# Patient Record
Sex: Female | Born: 1960 | Race: White | Hispanic: No | Marital: Single | Smoking: Current every day smoker
Health system: Southern US, Community
[De-identification: ages and names within clinical notes are randomized; demographics above are authoritative.]

## PROBLEM LIST (undated history)

## (undated) DIAGNOSIS — F79 Unspecified intellectual disabilities: Secondary | ICD-10-CM

## (undated) DIAGNOSIS — R569 Unspecified convulsions: Secondary | ICD-10-CM

## (undated) DIAGNOSIS — N289 Disorder of kidney and ureter, unspecified: Secondary | ICD-10-CM

## (undated) HISTORY — PX: ANKLE FUSION: SHX881

---

## 2014-03-23 ENCOUNTER — Emergency Department: Payer: Self-pay | Admitting: Emergency Medicine

## 2014-03-23 LAB — DRUG SCREEN, URINE
Amphetamines, Ur Screen: NEGATIVE (ref ?–1000)
Barbiturates, Ur Screen: NEGATIVE (ref ?–200)
Benzodiazepine, Ur Scrn: NEGATIVE (ref ?–200)
CANNABINOID 50 NG, UR ~~LOC~~: NEGATIVE (ref ?–50)
Cocaine Metabolite,Ur ~~LOC~~: NEGATIVE (ref ?–300)
MDMA (Ecstasy)Ur Screen: POSITIVE (ref ?–500)
Methadone, Ur Screen: NEGATIVE (ref ?–300)
Opiate, Ur Screen: NEGATIVE (ref ?–300)
PHENCYCLIDINE (PCP) UR S: NEGATIVE (ref ?–25)
Tricyclic, Ur Screen: NEGATIVE (ref ?–1000)

## 2014-03-23 LAB — CBC
HCT: 38.1 % (ref 35.0–47.0)
HGB: 12.5 g/dL (ref 12.0–16.0)
MCH: 30 pg (ref 26.0–34.0)
MCHC: 32.7 g/dL (ref 32.0–36.0)
MCV: 92 fL (ref 80–100)
Platelet: 267 10*3/uL (ref 150–440)
RBC: 4.15 10*6/uL (ref 3.80–5.20)
RDW: 13.9 % (ref 11.5–14.5)
WBC: 14.2 10*3/uL — ABNORMAL HIGH (ref 3.6–11.0)

## 2014-03-23 LAB — URINALYSIS, COMPLETE
BLOOD: NEGATIVE
Bilirubin,UR: NEGATIVE
Glucose,UR: NEGATIVE mg/dL (ref 0–75)
Ketone: NEGATIVE
Nitrite: NEGATIVE
PH: 5 (ref 4.5–8.0)
RBC,UR: 4 /HPF (ref 0–5)
Specific Gravity: 1.015 (ref 1.003–1.030)
WBC UR: 55 /HPF (ref 0–5)

## 2014-03-23 LAB — COMPREHENSIVE METABOLIC PANEL
ALBUMIN: 3.9 g/dL (ref 3.4–5.0)
ALK PHOS: 68 U/L
ALT: 20 U/L
ANION GAP: 6 — AB (ref 7–16)
AST: 18 U/L (ref 15–37)
BILIRUBIN TOTAL: 0.4 mg/dL (ref 0.2–1.0)
BUN: 31 mg/dL — ABNORMAL HIGH (ref 7–18)
CO2: 26 mmol/L (ref 21–32)
Calcium, Total: 9.7 mg/dL (ref 8.5–10.1)
Chloride: 108 mmol/L — ABNORMAL HIGH (ref 98–107)
Creatinine: 2.97 mg/dL — ABNORMAL HIGH (ref 0.60–1.30)
EGFR (African American): 20 — ABNORMAL LOW
EGFR (Non-African Amer.): 17 — ABNORMAL LOW
Glucose: 138 mg/dL — ABNORMAL HIGH (ref 65–99)
Osmolality: 288 (ref 275–301)
Potassium: 4.2 mmol/L (ref 3.5–5.1)
SODIUM: 140 mmol/L (ref 136–145)
Total Protein: 8.3 g/dL — ABNORMAL HIGH (ref 6.4–8.2)

## 2014-03-23 LAB — SALICYLATE LEVEL: Salicylates, Serum: 4.5 mg/dL — ABNORMAL HIGH

## 2014-03-23 LAB — ACETAMINOPHEN LEVEL

## 2014-03-23 LAB — ETHANOL: Ethanol %: <0.003 % (ref 0.000–0.080)

## 2014-03-24 LAB — CBC WITH DIFFERENTIAL/PLATELET
Basophil #: 0.1 10*3/uL (ref 0.0–0.1)
Basophil %: 0.8 %
Eosinophil #: 0.5 10*3/uL (ref 0.0–0.7)
Eosinophil %: 6.9 %
HCT: 32.4 % — AB (ref 35.0–47.0)
HGB: 10.6 g/dL — ABNORMAL LOW (ref 12.0–16.0)
Lymphocyte #: 2.1 10*3/uL (ref 1.0–3.6)
Lymphocyte %: 30.3 %
MCH: 30.5 pg (ref 26.0–34.0)
MCHC: 32.8 g/dL (ref 32.0–36.0)
MCV: 93 fL (ref 80–100)
MONOS PCT: 7.8 %
Monocyte #: 0.5 x10 3/mm (ref 0.2–0.9)
NEUTROS ABS: 3.7 10*3/uL (ref 1.4–6.5)
Neutrophil %: 54.2 %
Platelet: 203 10*3/uL (ref 150–440)
RBC: 3.49 10*6/uL — ABNORMAL LOW (ref 3.80–5.20)
RDW: 14.1 % (ref 11.5–14.5)
WBC: 6.9 10*3/uL (ref 3.6–11.0)

## 2014-03-24 LAB — COMPREHENSIVE METABOLIC PANEL
ALBUMIN: 3.1 g/dL — AB (ref 3.4–5.0)
ALT: 14 U/L
Alkaline Phosphatase: 64 U/L
Anion Gap: 4 — ABNORMAL LOW (ref 7–16)
BILIRUBIN TOTAL: 0.3 mg/dL (ref 0.2–1.0)
BUN: 33 mg/dL — AB (ref 7–18)
Calcium, Total: 8.5 mg/dL (ref 8.5–10.1)
Chloride: 109 mmol/L — ABNORMAL HIGH (ref 98–107)
Co2: 26 mmol/L (ref 21–32)
Creatinine: 2.59 mg/dL — ABNORMAL HIGH (ref 0.60–1.30)
EGFR (African American): 24 — ABNORMAL LOW
EGFR (Non-African Amer.): 20 — ABNORMAL LOW
GLUCOSE: 106 mg/dL — AB (ref 65–99)
Osmolality: 285 (ref 275–301)
Potassium: 4 mmol/L (ref 3.5–5.1)
SGOT(AST): 12 U/L — ABNORMAL LOW (ref 15–37)
Sodium: 139 mmol/L (ref 136–145)
Total Protein: 6.8 g/dL (ref 6.4–8.2)

## 2014-07-08 ENCOUNTER — Ambulatory Visit: Payer: Self-pay | Admitting: Nephrology
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
## 2014-12-20 NOTE — Consult Note (Signed)
Brief Consult Note: Comments: Psychiatry: Follow up on patient seen by Dr Demetrius CharityP. Patient has no new complaints and has been calm and not threatening or agitated. Patient is to be discharged thhis evening. Will proceed to halt IVC paperwork.  Electronic Signatures: Audery Amellapacs, Wayde Gopaul T (MD)  (Signed 29-Jul-15 17:32)  Authored: Brief Consult Note   Last Updated: 29-Jul-15 17:32 by Audery Amellapacs, Jalyn Dutta T (MD)

## 2015-02-11 ENCOUNTER — Encounter: Payer: Self-pay | Admitting: Emergency Medicine

## 2015-02-11 ENCOUNTER — Emergency Department
Admission: EM | Admit: 2015-02-11 | Discharge: 2015-02-13 | Disposition: A | Discharge status: Other Acute Inpatient Hospital | Payer: Medicare Other | Attending: Emergency Medicine | Admitting: Emergency Medicine

## 2015-02-11 DIAGNOSIS — R51 Headache: Secondary | ICD-10-CM | POA: Diagnosis not present

## 2015-02-11 DIAGNOSIS — R451 Restlessness and agitation: Secondary | ICD-10-CM

## 2015-02-11 DIAGNOSIS — Z720 Tobacco use: Secondary | ICD-10-CM | POA: Insufficient documentation

## 2015-02-11 DIAGNOSIS — F319 Bipolar disorder, unspecified: Secondary | ICD-10-CM

## 2015-02-11 DIAGNOSIS — F911 Conduct disorder, childhood-onset type: Secondary | ICD-10-CM | POA: Diagnosis present

## 2015-02-11 HISTORY — DX: Unspecified intellectual disabilities: F79

## 2015-02-11 LAB — COMPREHENSIVE METABOLIC PANEL
ALBUMIN: 4 g/dL (ref 3.5–5.0)
ALK PHOS: 73 U/L (ref 38–126)
ALT: 10 U/L — AB (ref 14–54)
ANION GAP: 6 (ref 5–15)
AST: 15 U/L (ref 15–41)
BUN: 30 mg/dL — ABNORMAL HIGH (ref 6–20)
CO2: 28 mmol/L (ref 22–32)
Calcium: 9.9 mg/dL (ref 8.9–10.3)
Chloride: 110 mmol/L (ref 101–111)
Creatinine, Ser: 2.1 mg/dL — ABNORMAL HIGH (ref 0.44–1.00)
GFR calc Af Amer: 30 mL/min — ABNORMAL LOW (ref >60)
GFR calc non Af Amer: 26 mL/min — ABNORMAL LOW (ref >60)
Glucose, Bld: 118 mg/dL — ABNORMAL HIGH (ref 65–99)
Potassium: 4.6 mmol/L (ref 3.5–5.1)
SODIUM: 144 mmol/L (ref 135–145)
TOTAL PROTEIN: 8.1 g/dL (ref 6.5–8.1)
Total Bilirubin: 0.4 mg/dL (ref 0.3–1.2)

## 2015-02-11 LAB — CBC
HEMATOCRIT: 36.2 % (ref 35.0–47.0)
Hemoglobin: 12.4 g/dL (ref 12.0–16.0)
MCH: 31.1 pg (ref 26.0–34.0)
MCHC: 34.3 g/dL (ref 32.0–36.0)
MCV: 90.6 fL (ref 80.0–100.0)
Platelets: 260 10*3/uL (ref 150–440)
RBC: 3.99 MIL/uL (ref 3.80–5.20)
RDW: 14.5 % (ref 11.5–14.5)
WBC: 7.4 10*3/uL (ref 3.6–11.0)

## 2015-02-11 LAB — URINALYSIS COMPLETE WITH MICROSCOPIC (ARMC ONLY)
Bacteria, UA: NONE SEEN
Bilirubin Urine: NEGATIVE
Glucose, UA: NEGATIVE mg/dL
Hgb urine dipstick: NEGATIVE
Ketones, ur: NEGATIVE mg/dL
Nitrite: NEGATIVE
PH: 6 (ref 5.0–8.0)
PROTEIN: NEGATIVE mg/dL
Specific Gravity, Urine: 1.014 (ref 1.005–1.030)

## 2015-02-11 LAB — URINE DRUG SCREEN, QUALITATIVE (ARMC ONLY)
Amphetamines, Ur Screen: NOT DETECTED
Barbiturates, Ur Screen: NOT DETECTED
Benzodiazepine, Ur Scrn: NOT DETECTED
Cannabinoid 50 Ng, Ur ~~LOC~~: NOT DETECTED
Cocaine Metabolite,Ur ~~LOC~~: NOT DETECTED
MDMA (ECSTASY) UR SCREEN: NOT DETECTED
Methadone Scn, Ur: NOT DETECTED
OPIATE, UR SCREEN: NOT DETECTED
PHENCYCLIDINE (PCP) UR S: NOT DETECTED
Tricyclic, Ur Screen: POSITIVE — AB

## 2015-02-11 LAB — ETHANOL

## 2015-02-11 LAB — SALICYLATE LEVEL

## 2015-02-11 LAB — ACETAMINOPHEN LEVEL: Acetaminophen (Tylenol), Serum: <10 ug/mL — ABNORMAL LOW (ref 10–30)

## 2015-02-11 NOTE — ED Notes (Signed)
Pt changed into paper scrubs in triage.

## 2015-02-11 NOTE — ED Notes (Signed)
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
## 2015-02-11 NOTE — ED Notes (Signed)
Per Dr. Lenard Lance, pt does not need one-on-one sitter.  Fifteen minutes checks required.
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
## 2015-02-11 NOTE — ED Notes (Signed)
Patient assigned to appropriate care area. Patient oriented to unit/care area: Informed that, for their safety, care areas are designed for safety and monitored by security cameras at all times; and visiting hours explained to patient. Patient verbalizes understanding, and verbal contract for safety obtained. 
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
## 2015-02-12 ENCOUNTER — Emergency Department: Payer: Medicare Other

## 2015-02-12 DIAGNOSIS — F4325 Adjustment disorder with mixed disturbance of emotions and conduct: Secondary | ICD-10-CM

## 2015-02-12 DIAGNOSIS — R451 Restlessness and agitation: Secondary | ICD-10-CM | POA: Diagnosis not present

## 2015-02-12 DIAGNOSIS — F319 Bipolar disorder, unspecified: Secondary | ICD-10-CM

## 2015-02-12 LAB — PREGNANCY, URINE: Preg Test, Ur: NEGATIVE

## 2015-02-12 MED ORDER — ACETAMINOPHEN 500 MG PO TABS
1000.0000 mg | ORAL_TABLET | Freq: Once | ORAL | Status: AC
  Administered 2015-02-12: 1000 mg via ORAL

## 2015-02-12 MED ORDER — NICOTINE 10 MG IN INHA
1.0000 | RESPIRATORY_TRACT | Status: DC | PRN
  Administered 2015-02-12: 18:00:00 via RESPIRATORY_TRACT

## 2015-02-12 MED ORDER — ACETAMINOPHEN 500 MG PO TABS
ORAL_TABLET | ORAL | Status: AC
Start: 2015-02-12 — End: 2015-02-12
  Administered 2015-02-12: 1000 mg via ORAL
  Filled 2015-02-12: qty 2

## 2015-02-12 MED ORDER — ONDANSETRON 4 MG PO TBDP
8.0000 mg | ORAL_TABLET | Freq: Once | ORAL | Status: AC
  Administered 2015-02-13: 8 mg via ORAL

## 2015-02-12 MED ORDER — NICOTINE 10 MG IN INHA
RESPIRATORY_TRACT | Status: AC
  Filled 2015-02-12: qty 36

## 2015-02-12 MED ORDER — ACETAMINOPHEN 325 MG PO TABS
650.0000 mg | ORAL_TABLET | Freq: Once | ORAL | Status: AC
  Administered 2015-02-13: 650 mg via ORAL

## 2015-02-12 NOTE — ED Notes (Signed)
Patient assigned to appropriate care area. Patient oriented to unit/care area: Informed that, for their safety, care areas are designed for safety and monitored by security cameras at all times; and visiting hours explained to patient. Patient verbalizes understanding, and verbal contract for safety obtained. 
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
## 2015-02-12 NOTE — ED Notes (Signed)

## 2015-02-12 NOTE — ED Notes (Signed)

## 2015-02-12 NOTE — ED Notes (Signed)
Pt. Noted in day room. No complaints or concerns voiced. No distress or abnormal behavior noted. Will continue to monitor with security cameras. Q 15 minute rounds continue. 

## 2015-02-12 NOTE — ED Notes (Signed)
Patient returned to [facility] from CT scan. No obvious distress. Will continue to monitor.

## 2015-02-12 NOTE — ED Notes (Signed)
ED BHU PLACEMENT JUSTIFICATION Is the patient under IVC or is there intent for IVC: Yes.   Is the patient medically cleared: Yes.   Is there vacancy in the ED BHU: Yes.   Is the population mix appropriate for patient: Yes.   Is the patient awaiting placement in inpatient or outpatient setting: Patient awaiting psych consult in order to determine disposition Has the patient had a psychiatric consult: No. Survey of unit performed for contraband, proper placement and condition of furniture, tampering with fixtures in bathroom, shower, and each patient room: Yes.  ; Findings: none APPEARANCE/BEHAVIOR calm and cooperative NEURO ASSESSMENT Orientation: alert and oriented x4 Hallucinations: none verbalized or observed Speech: Normal Gait: normal RESPIRATORY ASSESSMENT Breathing Pattern: regular and unlabored CARDIOVASCULAR ASSESSMENT Skin warm and dry; Color WNL GASTROINTESTINAL ASSESSMENT No abdominal complaints at this time EXTREMITIES Moves all extremities PLAN OF CARE Provide calm/safe environment. Vital signs assessed twice daily. ED BHU Assessment once each 12-hour shift. Collaborate with intake RN daily or as condition indicates. Assure the ED provider has rounded once each shift. Provide and encourage hygiene. Provide redirection as needed. Assess for escalating behavior; address immediately and inform ED provider.  Assess family dynamic and appropriateness for visitation as needed: Yes.  ; If necessary, describe findings: n/a Educate the patient/family about BHU procedures/visitation: Yes.  ; If necessary, describe findings: n/a
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
## 2015-02-12 NOTE — ED Notes (Signed)
No POC urine pregnancy results noted in chart. Discussed with ED MD. Verbal order to modify order to urine pregnancy test. Lab notified of add on test.

## 2015-02-12 NOTE — ED Notes (Signed)
Pt. Noted in room. No complaints or concerns voiced. No distress or abnormal behavior noted. Will continue to monitor with security cameras. Q 15 minute rounds continue. Sandwich and soft drink given. 

## 2015-02-12 NOTE — ED Notes (Signed)
Report received from Jean RN. Pt. Alert and oriented in no distress denies SI, HI, AVH and pain.  Pt. Instructed to come to me with problems or concerns.Will continue to monitor for safety via security cameras and Q 15 minute checks. 

## 2015-02-12 NOTE — ED Provider Notes (Signed)
-----------------------------------------   6:26 AM on 02/12/2015 -----------------------------------------   BP 120/89 mmHg  Pulse 100  Temp(Src) 98.3 F (36.8 C) (Oral)  Resp 18  Ht 5\' 3"  (1.6 m)  Wt 290 lb (131.543 kg)  BMI 51.38 kg/m2  SpO2 96%  The patient had no acute events since last update.  Calm and cooperative at this time.  Disposition is pending per Psychiatry/Behavioral Medicine team recommendations.     Sharman Cheek, MD 02/12/15 [phone]
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
## 2015-02-12 NOTE — ED Notes (Signed)
Pt ambulated to the bathroom and returned to her bedroom without difficulty.

## 2015-02-12 NOTE — ED Notes (Addendum)
Patient tearful and c/o headache. Notified ED MD. Discussed patient's home meds with patient; patient cannot remember her meds or pharmacy at this time.

## 2015-02-12 NOTE — ED Notes (Addendum)
BEHAVIORAL HEALTH ROUNDING Patient sleeping: no   Patient alert and oriented: yes Behavior appropriate: Yes.  ; If no, describe: Nutrition and fluids offered: no Toileting and hygiene offered: yes Sitter present: no Law enforcement present: Yes  and [place]DS  

## 2015-02-12 NOTE — ED Notes (Signed)
Pt. Noted in room. No complaints or concerns voiced. No distress or abnormal behavior noted. Will continue to monitor with security cameras. Q 15 minute rounds continue. 
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
## 2015-02-12 NOTE — ED Notes (Signed)
Patient transported to CT. ODS and RN escort.

## 2015-02-12 NOTE — [facility] Counselor (Signed)
Pt. is to be admitted to [facility] by Dr. Toni Amend. Attending Physician will be Dr. Ardyth Harps.  Pt. has been assigned to room 309, by [facility] Charge Nurse Jerry Caras.  Intake Paper Work has been signed and placed on pt. chart. ER staff Bonita Quin and [place], ER Sect, Glenford Peers., RN, Bradly Chris Pt. Access) have been made aware of the admission.   Writer attempted to contact pt. Family to informed them but was unable to contact with family (Brother and sister-in-law: Shaunessy Ala [phone]; [phone]).

## 2015-02-12 NOTE — ED Notes (Addendum)
Dr Claypex at bedside 

## 2015-02-12 NOTE — ED Notes (Signed)
BEHAVIORAL HEALTH ROUNDING Patient sleeping: Yes.   Patient alert and oriented: Pt is sleeping.  Behavior appropriate: Pt is sleeping Nutrition and fluids offered: Pt is sleeping.  Toileting and hygiene offered: Pt is sleeping.  Sitter present: yes Law enforcement present: Yes  

## 2015-02-13 ENCOUNTER — Inpatient Hospital Stay
Admit: 2015-02-13 | Discharge: 2015-02-16 | DRG: 882 | Disposition: A | Discharge status: Home / Self Care | Payer: Medicare Other | Source: Ambulatory Visit | Attending: Psychiatry | Admitting: Psychiatry

## 2015-02-13 ENCOUNTER — Inpatient Hospital Stay: Admit: 2015-02-13 | Payer: Self-pay | Source: Ambulatory Visit | Admitting: Psychiatry

## 2015-02-13 DIAGNOSIS — F79 Unspecified intellectual disabilities: Secondary | ICD-10-CM | POA: Diagnosis present

## 2015-02-13 DIAGNOSIS — Z79899 Other long term (current) drug therapy: Secondary | ICD-10-CM

## 2015-02-13 DIAGNOSIS — J45909 Unspecified asthma, uncomplicated: Secondary | ICD-10-CM | POA: Diagnosis present

## 2015-02-13 DIAGNOSIS — Z811 Family history of alcohol abuse and dependence: Secondary | ICD-10-CM

## 2015-02-13 DIAGNOSIS — F17210 Nicotine dependence, cigarettes, uncomplicated: Secondary | ICD-10-CM | POA: Diagnosis present

## 2015-02-13 DIAGNOSIS — F6389 Other impulse disorders: Secondary | ICD-10-CM | POA: Diagnosis present

## 2015-02-13 DIAGNOSIS — I10 Essential (primary) hypertension: Secondary | ICD-10-CM | POA: Diagnosis present

## 2015-02-13 DIAGNOSIS — Z9889 Other specified postprocedural states: Secondary | ICD-10-CM | POA: Diagnosis not present

## 2015-02-13 DIAGNOSIS — Q040 Congenital malformations of corpus callosum: Secondary | ICD-10-CM

## 2015-02-13 DIAGNOSIS — F4325 Adjustment disorder with mixed disturbance of emotions and conduct: Principal | ICD-10-CM

## 2015-02-13 DIAGNOSIS — G4700 Insomnia, unspecified: Secondary | ICD-10-CM | POA: Diagnosis present

## 2015-02-13 DIAGNOSIS — R451 Restlessness and agitation: Secondary | ICD-10-CM | POA: Diagnosis not present

## 2015-02-13 DIAGNOSIS — F71 Moderate intellectual disabilities: Secondary | ICD-10-CM

## 2015-02-13 DIAGNOSIS — F319 Bipolar disorder, unspecified: Secondary | ICD-10-CM | POA: Diagnosis present

## 2015-02-13 DIAGNOSIS — F639 Impulse disorder, unspecified: Secondary | ICD-10-CM

## 2015-02-13 MED ORDER — LISINOPRIL 20 MG PO TABS
10.0000 mg | ORAL_TABLET | Freq: Every day | ORAL | Status: DC
  Administered 2015-02-13 – 2015-02-16 (×4): 10 mg via ORAL
  Filled 2015-02-13: qty 0.5
  Filled 2015-02-13 (×3): qty 1

## 2015-02-13 MED ORDER — QUETIAPINE FUMARATE 25 MG PO TABS: 50.0000 mg | ORAL_TABLET | Freq: Two times a day (BID) | ORAL | Status: DC

## 2015-02-13 MED ORDER — ACETAMINOPHEN 325 MG PO TABS
ORAL_TABLET | ORAL | Status: AC
  Filled 2015-02-13: qty 2

## 2015-02-13 MED ORDER — NICOTINE 10 MG IN INHA: 1.0000 | RESPIRATORY_TRACT | Status: DC | PRN

## 2015-02-13 MED ORDER — ONDANSETRON HCL 4 MG PO TABS
ORAL_TABLET | ORAL | Status: DC
Start: 2015-02-13 — End: 2015-02-13
  Filled 2015-02-13: qty 2

## 2015-02-13 MED ORDER — LORAZEPAM 2 MG PO TABS: 2.0000 mg | ORAL_TABLET | ORAL | Status: DC | PRN

## 2015-02-13 MED ORDER — TRAZODONE HCL 100 MG PO TABS
200.0000 mg | ORAL_TABLET | Freq: Every day | ORAL | Status: DC
  Administered 2015-02-13 – 2015-02-15 (×3): 200 mg via ORAL
  Filled 2015-02-13 (×3): qty 2

## 2015-02-13 MED ORDER — ONDANSETRON 4 MG PO TBDP
ORAL_TABLET | ORAL | Status: AC
  Filled 2015-02-13: qty 2

## 2015-02-13 MED ORDER — QUETIAPINE FUMARATE 100 MG PO TABS
100.0000 mg | ORAL_TABLET | Freq: Every day | ORAL | Status: DC
  Administered 2015-02-13 – 2015-02-15 (×3): 100 mg via ORAL
  Filled 2015-02-13 (×3): qty 1

## 2015-02-13 MED ORDER — ALUM & MAG HYDROXIDE-SIMETH 200-200-20 MG/5ML PO SUSP: 30.0000 mL | ORAL | Status: DC | PRN

## 2015-02-13 MED ORDER — ACETAMINOPHEN 325 MG PO TABS: 650.0000 mg | ORAL_TABLET | Freq: Four times a day (QID) | ORAL | Status: DC | PRN

## 2015-02-13 MED ORDER — MAGNESIUM HYDROXIDE 400 MG/5ML PO SUSP: 30.0000 mL | Freq: Every day | ORAL | Status: DC | PRN

## 2015-02-13 MED ORDER — LAMOTRIGINE 100 MG PO TABS
100.0000 mg | ORAL_TABLET | Freq: Every day | ORAL | Status: DC
  Administered 2015-02-13 – 2015-02-15 (×3): 100 mg via ORAL
  Filled 2015-02-13 (×3): qty 1

## 2015-02-13 MED ORDER — QUETIAPINE FUMARATE 25 MG PO TABS
50.0000 mg | ORAL_TABLET | Freq: Every day | ORAL | Status: DC
  Administered 2015-02-13 – 2015-02-16 (×4): 50 mg via ORAL
  Filled 2015-02-13 (×4): qty 2

## 2015-02-13 MED ORDER — HYDROCHLOROTHIAZIDE 12.5 MG PO CAPS
12.5000 mg | ORAL_CAPSULE | Freq: Every day | ORAL | Status: DC
Start: 2015-02-13 — End: 2015-02-16
  Administered 2015-02-13 – 2015-02-16 (×4): 12.5 mg via ORAL
  Filled 2015-02-13 (×4): qty 1

## 2015-02-13 NOTE — ED Notes (Signed)
Pt. Noted sleeping in room. No complaints or concerns voiced. No distress or abnormal behavior noted. Will continue to monitor with security cameras. Q 15 minute rounds continue. 

## 2015-02-13 NOTE — [facility] Group Notes (Signed)
[facility] Group Notes:  (Nursing/MHT/Case Management/Adjunct)  Date:  02/13/2015  Time:  11:55 AM  Type of Therapy:  Group Therapy  Participation Level:  Active  Participation Quality:  Appropriate, Attentive and Sharing  Affect:  Appropriate  Cognitive:  Alert and Appropriate  Insight:  Improving and Limited  Engagement in Group:  Engaged  Modes of Intervention:  Discussion, Education and Support  Summary of Progress/Problems:  Mickey Farber 02/13/2015, 11:55 AM

## 2015-02-13 NOTE — Progress Notes (Signed)
Recreation Therapy Notes  INPATIENT RECREATION THERAPY ASSESSMENT  Patient Details Name: Kawana Roseland MRN: [id number] DOB: 1960-09-20 Today's Date: 02/13/2015  Patient Stressors: Family  Coping Skills:   Isolate, Avoidance, Exercise, Art/Dance, Music, Talking, Sports, Other (Comment) (Puzzle books)  Personal Challenges: Anger, Communication, Concentration, Expressing Yourself, Problem-Solving, Self-Esteem/Confidence, Stress Management, Time Management  Leisure Interests (2+):  Individual - Other (Comment) (Puzzle books, singing)  Awareness of Community Resources:  Yes  Community Resources:  [employer], [employer]  Current Use: Yes  If no, Barriers?:    Patient Strengths:  Encouraging, help others  Patient Identified Areas of Improvement:  Get out of old situation and make new situation  Current Recreation Participation:  Go for walks  Patient Goal for Hospitalization:  Tp get on the right track with her medicines  City of Residence:  Doesn't know what city she lives in  [place] of Residence:      Current SI (including self-harm):  No  Current HI:  Yes  Consent to Intern Participation: N/A   Jacquelynn Cree, LRT/CTRS 02/13/2015, 4:38 PM

## 2015-02-13 NOTE — ED Notes (Signed)
Pt. Transported to [facility] unit with Radiographer, therapeutic.

## 2015-02-13 NOTE — ED Notes (Signed)
Pt. Noted in room. No complaints or concerns voiced. No distress or abnormal behavior noted. Will continue to monitor with security cameras. Q 15 minute rounds continue. 
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
## 2015-02-13 NOTE — [facility] Group Notes (Signed)
Adult Psychoeducational Group Note  Date:  02/13/2015 Time:  11:10 PM  Group Topic/Focus:  Wrap-Up Group:   The focus of this group is to help patients review their daily goal of treatment and discuss progress on daily workbooks.  Participation Level:  Active  Participation Quality:  Appropriate  Affect:  Appropriate  Cognitive:  Alert  Insight: Good  Engagement in Group:  Improving  Modes of Intervention:  Discussion  Additional Comments:  N/A  Tomasita Morrow 02/13/2015, 11:10 PM
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
## 2015-02-13 NOTE — Progress Notes (Signed)
Recreation Therapy Notes  Date: 06.17.16 Time: 3:00 pm Location: Craft Room  Group Topic: Self-expression/coping skills  Goal Area(s) Addresses:  Patient will effectively use art as a means of self-expression. Patient recognize positive benefit for self-expression. Patient will be able to identify one emotion experienced during group session. Patient will identify use of art/self-expression as a coping skill.  Behavioral Response: Attentive, Interactive  Intervention: Two Faces of Me  Activity: Patients were given a blank face worksheet and instructed to draw or write how they felt when they were admitted to the hospital on one said and draw or write how they want to feel when they are d/c on the other side.  Education: LRT educated patients on different forms of self-expression.   Education Outcome: Acknowledges education/In group clarification offered  Clinical Observations/Feedback: Patient completed activity by writing how she felt when she was admitted and how she wants to feel when she is d/c. Patient contributed to group discussion by stating how her faces were different.  Jacquelynn Cree, LRT/CTRS 02/13/2015 4:15 PM

## 2015-02-13 NOTE — ED Notes (Signed)
Pt. C/o nausea. Will medicate and provide ginger ale.

## 2015-02-13 NOTE — Progress Notes (Signed)
Patient denies suicidal & homicidal ideation.Appropriate with staff & peers.Visible in the milieu.attended groups.She did not go back to live with her family.States "I like the small ones not the old ones."It was hard to convince her to take her pills because it was not the same color & shape the ones she takes home.Appetite good.

## 2015-02-13 NOTE — [facility] Suicide Risk Assessment (Signed)
[facility] Admission Suicide Risk Assessment   Nursing information obtained from:  Patient Demographic factors:  Caucasian Current Mental Status:  NA Loss Factors:  NA Historical Factors:  NA Risk Reduction Factors:  Living with another person, especially a relative Total Time spent with patient: 1 hour Principal Problem: Adjustment disorder with mixed disturbance of emotions and conduct Diagnosis:   Patient Active Problem List   Diagnosis Date Noted  . 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 HTN (hypertension) [I10] 02/13/2015  . 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 Adjustment disorder with mixed disturbance of emotions and conduct [F43.25] 02/12/2015     Continued Clinical Symptoms:  Alcohol Use Disorder Identification Test Final Score (AUDIT): 0 The "Alcohol Use Disorders Identification Test", Guidelines for Use in Primary Care, Second Edition.  World Science writer [facility]). Score between 0-7:  no or low risk or alcohol related problems. Score between 8-15:  moderate risk of alcohol related problems. Score between 16-19:  high risk of alcohol related problems. Score 20 or above:  warrants further diagnostic evaluation for alcohol dependence and treatment.   CLINICAL FACTORS:   More than one psychiatric diagnosis Previous Psychiatric Diagnoses and Treatments   Psychiatric Specialty Exam: Physical Exam  ROS                                                           COGNITIVE FEATURES THAT CONTRIBUTE TO RISK:  Closed-mindedness    SUICIDE RISK:   Mild:  Suicidal ideation of limited frequency, intensity, duration, and specificity.  There are no identifiable plans, no associated intent, mild dysphoria and related symptoms, good self-control (both objective and subjective assessment), few other risk factors, and identifiable protective factors, including available  and accessible social support.  PLAN OF CARE: admit to [facility]  Medical Decision Making:  Established Problem, Stable/Improving (1)  I certify that inpatient services furnished can reasonably be expected to improve the patient's condition.   Jimmy Footman 02/13/2015, 4:21 PM

## 2015-02-13 NOTE — [facility] Group Notes (Signed)
[facility] LCSW Aftercare Discharge Planning Group Note  02/13/2015 11:02 AM  Participation Quality:  Did not attend  Affect:    Cognitive:    Insight:    Engagement in Group:    Modes of Intervention:    Summary of Progress/Problems:  Jenna Riggs 02/13/2015, 11:02 AM

## 2015-02-13 NOTE — ED Notes (Addendum)
Pt. Noted in room. No distress or abnormal behavior noted. Will continue to monitor with security cameras. Q 15 minute rounds continue. 

## 2015-02-13 NOTE — [facility] Group Notes (Signed)
[facility] LCSW Group Therapy  02/13/2015 4:58 PM  Type of Therapy:  Group Therapy  Participation Level:  Active  Participation Quality:  Attentive  Affect:  Appropriate  Cognitive:  Appropriate  Insight:  Improving  Engagement in Therapy:  Developing/Improving  Modes of Intervention:  Discussion, Exploration and Support  Summary of Progress/Problems: LCSW reviewed group rules with each patient. It was a calm small group and we mad our focus on things we can do to promote our wellness, happiness and relationship obstacles. Patients were asked to share and reflect on personal dating experiences and then support one another with positive affirmations and funny life quotes. Patient related a funny story about her mother and brother and how they ruined her first date with a nice guy. Pts were entertained and supported patient as she spun her story.   Arrie Senate M 02/13/2015, 4:58 PM

## 2015-02-13 NOTE — ED Notes (Signed)
Pt. Back from [facility]. Pt. Placed back in room 5. No complaints or concerns voiced. No distress or abnormal behavior noted. Will continue to monitor with security cameras. Q 15 minute rounds continue.

## 2015-02-13 NOTE — Plan of Care (Signed)
Problem: Ineffective individual coping Goal: STG: Patient will remain free from self harm Outcome: Progressing Patient denies suicidal ideation.

## 2015-02-13 NOTE — Progress Notes (Signed)
Pt admitted to unit per IVC  For aggressive behavior at home. Pt denies SI/HI/AVH. Skin assessment and search for contraband done. None found.Pleasant and cooperative.

## 2015-02-14 LAB — HEMOGLOBIN A1C: HEMOGLOBIN A1C: 5.8 % (ref 4.0–6.0)

## 2015-02-14 LAB — LIPID PANEL
CHOL/HDL RATIO: 4.8 ratio
CHOLESTEROL: 145 mg/dL (ref 0–200)
HDL: 30 mg/dL — ABNORMAL LOW (ref >40)
LDL Cholesterol: 76 mg/dL (ref 0–99)
TRIGLYCERIDES: 194 mg/dL — AB (ref <150)
VLDL: 39 mg/dL (ref 0–40)

## 2015-02-14 NOTE — Plan of Care (Signed)
Problem: Ineffective individual coping Goal: LTG: Patient will report a decrease in negative feelings Outcome: Not Progressing Pt sleeping most of day

## 2015-02-14 NOTE — Progress Notes (Signed)
Patient denies SI, HI. Marland KitchenAppropriate with staff & peers. Pt sleeping most of shift. Refused meds this a m but took them later in shift. Appetite good. Will continue to assess and monitor for safety.

## 2015-02-14 NOTE — Progress Notes (Signed)
[facility] MD Progress Note  02/14/2015 1:35 PM Jenna Riggs  MRN:  [id number] Subjective:  Follow-up for this 54 year old woman with chronic intellectual disability. Brought into the hospital after getting aggressive at her home. Has not been aggressive here in the hospital. Principal Problem: Adjustment disorder with mixed disturbance of emotions and conduct Diagnosis:   Patient Active Problem List   Diagnosis Date Noted  . 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 HTN (hypertension) [I10] 02/13/2015  . 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 Adjustment disorder with mixed disturbance of emotions and conduct [F43.25] 02/12/2015   Total Time spent with patient: 30 minutes   Past Medical History:  Past Medical History  Diagnosis Date  . Mental retardation     Past Surgical History  Procedure Laterality Date  . Ankle fusion Right    Family History: History reviewed. No pertinent family history. Social History:  History  Alcohol Use No     History  Drug Use No    History   Social History  . Marital Status: Unknown    Spouse Name: N/A  . Number of Children: N/A  . Years of Education: N/A   Social History Main Topics  . Smoking status: Current Every Day Smoker -- 0.25 packs/day for 35 years    Types: Cigarettes  . Smokeless tobacco: Not on file  . Alcohol Use: No  . Drug Use: No  . Sexual Activity: Not on file   Other Topics Concern  . None   Social History Narrative   Additional History:    Sleep: Good  Appetite:  Good   Assessment:   Musculoskeletal: Strength & Muscle Tone: within normal limits Gait & Station: normal Patient leans: Right   Psychiatric Specialty Exam: Physical Exam  Constitutional: She appears well-developed and well-nourished.  HENT:  Head: Normocephalic and atraumatic.  Eyes: Conjunctivae are normal. Pupils are equal, round, and reactive to light.   Neck: Normal range of motion.  Cardiovascular: Normal heart sounds.   Respiratory: Effort normal.  GI: Soft.  Musculoskeletal: Normal range of motion.  Neurological: She is alert.  Skin: Skin is warm and dry.  Psychiatric: Her mood appears not anxious. Her affect is blunt. Her affect is not angry. Her speech is slurred. Her speech is not rapid and/or pressured and not delayed. She is not agitated, not aggressive, not hyperactive and not combative. Thought content is not paranoid. Cognition and memory are impaired. She expresses impulsivity. She does not exhibit a depressed mood. She expresses no homicidal and no suicidal ideation. She exhibits abnormal recent memory and abnormal remote memory.    Review of Systems  Constitutional: Negative.   HENT: Negative.   Eyes: Negative.   Respiratory: Negative.   Cardiovascular: Negative.   Gastrointestinal: Negative.   Musculoskeletal: Negative.   Skin: Negative.   Neurological: Negative.   Psychiatric/Behavioral: Negative for depression, suicidal ideas, hallucinations, memory loss and substance abuse. The patient is not nervous/anxious and does not have insomnia.     Blood pressure 135/85, pulse 58, temperature 96.8 F (36 C), temperature source Oral, resp. rate 20, height  (1.6 m), weight 101.606 kg (224 lb), SpO2 99 %.Body mass index is 39.69 kg/(m^2).  General Appearance: Disheveled  Eye Contact::  Minimal  Speech:  Blocked  Volume:  Decreased  Mood:  Negative  Affect:  Negative  Thought Process:  Tangential  Orientation:  Full (Time, Place, and Person)  Thought Content:  Negative  Suicidal Thoughts:  No  Homicidal Thoughts:  No  Memory:  Immediate;   Fair Recent;   Poor Remote;   Poor  Judgement:  Impaired  Insight:  Present  Psychomotor Activity:  Decreased  Concentration:  Fair  Recall:  [employer] of Knowledge:Fair  Language: Fair  Akathisia:  No  Handed:  Right  AIMS (if indicated):     Assets:  Desire for  Improvement Resilience Social Support  ADL's:  Intact  Cognition: Impaired,  Moderate  Sleep:  Number of Hours: 7.25     Current Medications: Current Facility-Administered Medications  Medication Dose Route Frequency Provider Last Rate Last Dose  . acetaminophen (TYLENOL) tablet 650 mg  650 mg Oral Q6H PRN Audery Amel, MD      . alum & mag hydroxide-simeth (MAALOX/MYLANTA) 200-200-20 MG/5ML suspension 30 mL  30 mL Oral Q4H PRN Audery Amel, MD      . hydrochlorothiazide (MICROZIDE) capsule 12.5 mg  12.5 mg Oral Daily Jimmy Footman, MD   12.5 mg at 02/13/15 1652  . lamoTRIgine (LAMICTAL) tablet 100 mg  100 mg Oral QHS Jimmy Footman, MD   100 mg at 02/13/15 2249  . lisinopril (PRINIVIL,ZESTRIL) tablet 10 mg  10 mg Oral Daily Jimmy Footman, MD   10 mg at 02/13/15 1652  . magnesium hydroxide (MILK OF MAGNESIA) suspension 30 mL  30 mL Oral Daily PRN Audery Amel, MD      . nicotine (NICOTROL) 10 MG inhaler 1 continuous puffing  1 continuous puffing Inhalation PRN Jimmy Footman, MD      . QUEtiapine (SEROQUEL) tablet 100 mg  100 mg Oral QHS Jimmy Footman, MD   100 mg at 02/13/15 2250  . QUEtiapine (SEROQUEL) tablet 50 mg  50 mg Oral Daily Jimmy Footman, MD   50 mg at 02/13/15 1653  . traZODone (DESYREL) tablet 200 mg  200 mg Oral QHS Jimmy Footman, MD   200 mg at 02/13/15 2250    Lab Results:  Results for orders placed or performed during the hospital encounter of 02/13/15 (from the past 48 hour(s))  Lipid panel     Status: Abnormal   Collection Time: 02/14/15  5:48 AM  Result Value Ref Range   Cholesterol 145 0 - 200 mg/dL   Triglycerides 161 (H) <150 mg/dL   HDL 30 (L) >09 mg/dL   Total CHOL/HDL Ratio 4.8 RATIO   VLDL 39 0 - 40 mg/dL   LDL Cholesterol 76 0 - 99 mg/dL    Comment:        Total Cholesterol/HDL:CHD Risk Coronary Heart Disease Risk Table                     Men   Women  1/2  Average Risk   3.4   3.3  Average Risk       5.0   4.4  2 X Average Risk   9.6   7.1  3 X Average Risk  23.4   11.0        Use the calculated Patient Ratio above and the CHD Risk Table to determine the patient's CHD Risk.        ATP III CLASSIFICATION (LDL):  <100     mg/dL   Optimal  604-540  mg/dL   Near or Above                    Optimal  130-159  mg/dL   Borderline  981-191  mg/dL   High  >458     mg/dL   Very High     Physical Findings: AIMS:  , ,  ,  ,    CIWA:    COWS:     Treatment Plan Summary: Daily contact with patient to assess and evaluate symptoms and progress in treatment, Medication management and Plan Patient seems to be stable without any aggression or dangerous behavior here in the hospital. She has been continued on her outpatient combination of Lamictal and Seroquel. She is not reporting any psychotic symptoms. Full evaluation of her social situation is pending. Her blood pressure is doing quite well on her current combination of medicine. Patient is using nicotine inhaler for tobacco problems. No change to medication today no new requests from the patient. Labs were reviewed and she does have an elevated triglyceride and a low HDL but both of them only mildly so. Hemoglobin A1c does not appear to be back yet.   Medical Decision Making:  Established Problem, Stable/Improving (1), Review of Psycho-Social Stressors (1), Review or order clinical lab tests (1), Review of Medication Regimen & Side Effects (2) and Review of New Medication or Change in Dosage (2)     John Clapacs 02/14/2015, 1:35 PM

## 2015-02-14 NOTE — [facility] Group Notes (Signed)
[facility] LCSW Group Therapy  02/14/2015 2:27 PM  Type of Therapy:  Group Therapy  Participation Level:  Active  Participation Quality:  Inattentive  Affect:  Blunted  Cognitive:  Disorganized  Insight:  Improving  Engagement in Therapy:  Engaged  Modes of Intervention:  Discussion, Education, Exploration and Support  Summary of Progress/Problems:LCSW introduced group rules and held group outdoors today. We focused on understanding our own communication styles and peers were encouraged to support each other on new ways to communicate. Examples used and discussed was body language, verbal exchanges,knowing when to speak up and out and know when not to interrupt. This patient was able to reflect she does not like being woken up to have her blood drawn today. Patient was empowered to speak up when she needs something.   Jenna Riggs M 02/14/2015, 2:27 PM

## 2015-02-14 NOTE — Plan of Care (Signed)
Problem: Ineffective individual coping Goal: STG: Patient will remain free from self harm Outcome: Progressing Pt has not done any self harm except for picking at her skin on her face. Pt reports that she picks at her face when she is nervous.

## 2015-02-14 NOTE — Progress Notes (Addendum)
Pt was visible in the milieu during the latter part of the evening. Cooperative with unit routine. Pt reports picking at her skin on her face when she is nervous. Fidgety, but is pleasant during interaction. No aggression or inappropriate behaviors. Compliant with hs medications. Denies pain. No problems toileting.

## 2015-02-15 NOTE — Plan of Care (Signed)
Problem: Ineffective individual coping Goal: STG-Increase in ability to manage activities of daily living Outcome: Progressing Pt maintains her ADLs

## 2015-02-15 NOTE — Progress Notes (Signed)
Patient denies feeling depressed or anxious; denies any SI and contracts for safety; has been calm, quiet, and cooperative throughout the shift; continues to be seclusive to her room; no voiced complaints; continue to monitor.

## 2015-02-15 NOTE — [facility] Suicide Risk Assessment (Signed)
[facility] INPATIENT:  Family/Significant Other Suicide Prevention Education  Suicide Prevention Education:  Education Completed; [name] Pelphrey(guardian) [phone] has been identified by the patient as the family member/significant other with whom the patient will be residing, and identified as the person(s) who will aid the patient in the event of a mental health crisis (suicidal ideations/suicide attempt).  With written consent from the patient, the family member/significant other has been provided the following suicide prevention education, prior to the and/or following the discharge of the patient.  The suicide prevention education provided includes the following:  Suicide risk factors  Suicide prevention and interventions  National Suicide Hotline telephone number  [facility] assessment telephone number  [facility] Emergency Assistance 911  [facility] and/or Residential Mobile Crisis Unit telephone number  Request made of family/significant other to:  Remove weapons (e.g., guns, rifles, knives), all items previously/currently identified as safety concern.    Remove drugs/medications (over-the-counter, prescriptions, illicit drugs), all items previously/currently identified as a safety concern.  The family member/significant other verbalizes understanding of the suicide prevention education information provided.  The family member/significant other agrees to remove the items of safety concern listed above.   Gaines Cartmell L Katura Eatherly 02/15/2015, 11:04 AM

## 2015-02-15 NOTE — Plan of Care (Signed)
Problem: Ineffective individual coping Goal: LTG: Patient will report a decrease in negative feelings Outcome: Progressing Patient denies feeling depressed or anxious at present.     

## 2015-02-15 NOTE — Plan of Care (Signed)
Problem: Ineffective individual coping Goal: STG: Patient will remain free from self harm Outcome: Progressing Patient denies having any SI, and contracts for safety.     

## 2015-02-15 NOTE — Progress Notes (Signed)
[facility] MD Progress Note  02/15/2015 12:35 PM Jenna Riggs  MRN:  [id number] Subjective:  Follow-up for this 54 year old woman with chronic intellectual disability. Brought into the hospital after getting aggressive at her home. Has not been aggressive here in the hospital. Principal Problem: Adjustment disorder with mixed disturbance of emotions and conduct Diagnosis:   Patient Active Problem List   Diagnosis Date Noted  . 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 HTN (hypertension) [I10] 02/13/2015  . 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 Adjustment disorder with mixed disturbance of emotions and conduct [F43.25] 02/12/2015   Total Time spent with patient: 30 minutes   Past Medical History:  Past Medical History  Diagnosis Date  . Mental retardation     Past Surgical History  Procedure Laterality Date  . Ankle fusion Right    Family History: History reviewed. No pertinent family history. Social History:  History  Alcohol Use No     History  Drug Use No    History   Social History  . Marital Status: Unknown    Spouse Name: N/A  . Number of Children: N/A  . Years of Education: N/A   Social History Main Topics  . Smoking status: Current Every Day Smoker -- 0.25 packs/day for 35 years    Types: Cigarettes  . Smokeless tobacco: Not on file  . Alcohol Use: No  . Drug Use: No  . Sexual Activity: Not on file   Other Topics Concern  . None   Social History Narrative   Additional History:    Sleep: Good  Appetite:  Good   Assessment:   Musculoskeletal: Strength & Muscle Tone: within normal limits Gait & Station: normal Patient leans: Right   Psychiatric Specialty Exam: Physical Exam  Constitutional: She appears well-developed and well-nourished.  HENT:  Head: Normocephalic and atraumatic.  Eyes: Conjunctivae are normal. Pupils are equal, round, and reactive to light.   Neck: Normal range of motion.  Cardiovascular: Normal heart sounds.   Respiratory: Effort normal.  GI: Soft.  Musculoskeletal: Normal range of motion.  Neurological: She is alert.  Skin: Skin is warm and dry.  Psychiatric: Her speech is normal. Judgment normal. Her mood appears not anxious. Her affect is blunt. Her affect is not angry. She is not agitated, not aggressive, not hyperactive and not combative. Thought content is not paranoid. Cognition and memory are impaired. She does not express impulsivity. She does not exhibit a depressed mood. She expresses no homicidal and no suicidal ideation. She exhibits abnormal recent memory and abnormal remote memory.    Review of Systems  Constitutional: Negative.   HENT: Negative.   Eyes: Negative.   Respiratory: Negative.   Cardiovascular: Negative.   Gastrointestinal: Negative.   Musculoskeletal: Negative.   Skin: Negative.   Neurological: Negative.   Psychiatric/Behavioral: Negative for depression, suicidal ideas, hallucinations, memory loss and substance abuse. The patient is not nervous/anxious and does not have insomnia.     Blood pressure 128/77, pulse 84, temperature 97.5 F (36.4 C), temperature source Oral, resp. rate 20, height  (1.6 m), weight 101.606 kg (224 lb), SpO2 99 %.Body mass index is 39.69 kg/(m^2).  General Appearance: Disheveled  Eye Contact::  Minimal  Speech:  Normal Rate  Volume:  Decreased  Mood:  Negative and Euthymic  Affect:  Congruent  Thought Process:  Tangential  Orientation:  Full (Time, Place, and Person)  Thought Content:  Negative and Much improved.  Not angry or paranoid  Suicidal Thoughts:  No  Homicidal Thoughts:  No  Memory:  Immediate;   Fair Recent;   Poor Remote;   Poor  Judgement:  Impaired  Insight:  Present  Psychomotor Activity:  Decreased  Concentration:  Fair  Recall:  [employer] of Knowledge:Fair  Language: Fair  Akathisia:  No  Handed:  Right  AIMS (if indicated):      Assets:  Desire for Improvement Resilience Social Support  ADL's:  Intact  Cognition: Impaired,  Moderate  Sleep:  Number of Hours: 7     Current Medications: Current Facility-Administered Medications  Medication Dose Route Frequency Provider Last Rate Last Dose  . acetaminophen (TYLENOL) tablet 650 mg  650 mg Oral Q6H PRN Audery Amel, MD      . alum & mag hydroxide-simeth (MAALOX/MYLANTA) 200-200-20 MG/5ML suspension 30 mL  30 mL Oral Q4H PRN Audery Amel, MD      . hydrochlorothiazide (MICROZIDE) capsule 12.5 mg  12.5 mg Oral Daily Jimmy Footman, MD   12.5 mg at 02/15/15 0953  . lamoTRIgine (LAMICTAL) tablet 100 mg  100 mg Oral QHS Jimmy Footman, MD   100 mg at 02/14/15 2115  . lisinopril (PRINIVIL,ZESTRIL) tablet 10 mg  10 mg Oral Daily Jimmy Footman, MD   10 mg at 02/15/15 0953  . magnesium hydroxide (MILK OF MAGNESIA) suspension 30 mL  30 mL Oral Daily PRN Audery Amel, MD      . nicotine (NICOTROL) 10 MG inhaler 1 continuous puffing  1 continuous puffing Inhalation PRN Jimmy Footman, MD      . QUEtiapine (SEROQUEL) tablet 100 mg  100 mg Oral QHS Jimmy Footman, MD   100 mg at 02/14/15 2116  . QUEtiapine (SEROQUEL) tablet 50 mg  50 mg Oral Daily Jimmy Footman, MD   50 mg at 02/15/15 0954  . traZODone (DESYREL) tablet 200 mg  200 mg Oral QHS Jimmy Footman, MD   200 mg at 02/14/15 2115    Lab Results:  Results for orders placed or performed during the hospital encounter of 02/13/15 (from the past 48 hour(s))  Lipid panel     Status: Abnormal   Collection Time: 02/14/15  5:48 AM  Result Value Ref Range   Cholesterol 145 0 - 200 mg/dL   Triglycerides 130 (H) <150 mg/dL   HDL 30 (L) >86 mg/dL   Total CHOL/HDL Ratio 4.8 RATIO   VLDL 39 0 - 40 mg/dL   LDL Cholesterol 76 0 - 99 mg/dL    Comment:        Total Cholesterol/HDL:CHD Risk Coronary Heart Disease Risk Table                     Men    Women  1/2 Average Risk   3.4   3.3  Average Risk       5.0   4.4  2 X Average Risk   9.6   7.1  3 X Average Risk  23.4   11.0        Use the calculated Patient Ratio above and the CHD Risk Table to determine the patient's CHD Risk.        ATP III CLASSIFICATION (LDL):  <100     mg/dL   Optimal  578-469  mg/dL   Near or Above                    Optimal  130-159  mg/dL  Borderline  160-189  mg/dL   High  >161     mg/dL   Very High   Hemoglobin A1c     Status: None   Collection Time: 02/14/15  5:48 AM  Result Value Ref Range   Hgb A1c MFr Bld 5.8 4.0 - 6.0 %    Physical Findings: AIMS:  , ,  ,  ,    CIWA:    COWS:     Treatment Plan Summary: Daily contact with patient to assess and evaluate symptoms and progress in treatment, Medication management and Plan Patient seen today for follow-up. Her mood is brighter and her affect is brighter. Eye contact better. Psychomotor activity more normal. Speech and or normal mole and fluid. Patient denies suicidal or homicidal ideation. Had a meeting with her sister-in-law yesterday and supposedly was told she didn't have to go back to live there and could go to a group home. Hemoglobin A1c is in the normal range. Vital signs normal. No indication for change to medical treatment. Follow-up primary care doctor and social work. Supportive counseling completed.   Medical Decision Making:  Established Problem, Stable/Improving (1), Review of Psycho-Social Stressors (1), Review or order clinical lab tests (1), Review of Medication Regimen & Side Effects (2) and Review of New Medication or Change in Dosage (2)     Jacquel Mccamish 02/15/2015, 12:35 PM
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
## 2015-02-15 NOTE — BHH Group Notes (Signed)
BHH Group Notes:  (Nursing/MHT/Case Management/Adjunct)  Date:  02/15/2015  Time:  9:19 AM  Type of Therapy:  Goals   Participation Level:  Did Not Attend   Marquette Old 02/15/2015, 9:19 AM

## 2015-02-15 NOTE — Progress Notes (Signed)
Pt visible on the unit.. States she is depressed hopeless that she cant go back to living with her brother and sisterinlaw. States they just want her check. Pt states she wants to go to a group or care home. Med compliant.
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
## 2015-02-16 NOTE — Progress Notes (Signed)
Recreation Therapy Notes  INPATIENT RECREATION TR PLAN  Patient Details Name: Camron Essman MRN: [id number] DOB: [birth year] Today's Date: 02/16/2015  Rec Therapy Plan Is patient appropriate for Therapeutic Recreation?: Yes Treatment times per week: At least 3 times a week TR Treatment/Interventions: 1:1 session, Group participation (Comment) (Appropriate participation in daily recreatin therapy tx)  Discharge Criteria Pt will be discharged from therapy if:: Discharged Treatment plan/goals/alternatives discussed and agreed upon by:: Patient/family  Discharge Summary Short term goals set: See Care Plan Short term goals met: Other (Comment) (Not applicable) Progress toward goals comments: Groups attended Which groups?: Coping skills, Other (Comment) (Self-expression) Reason goals not met: Patient refused 1:1 treatment session Therapeutic equipment acquired: None Reason patient discharged from therapy: Discharge from hospital Pt/family agrees with progress & goals achieved: Yes Date patient discharged from therapy: 02/16/15   Leonette Monarch, LRT/CTRS 02/16/2015, 12:26 PM

## 2015-02-16 NOTE — Plan of Care (Signed)
Problem: [facility] Participation in Recreation Therapeutic Interventions Goal: STG-Patient will demonstrate improved self esteem by identif STG: Self-Esteem - Within 5 treatment sessions, patient will verbalize at least 5 positive affirmation statements in each of 3 treatment to increase self-esteem post d/c.  Outcome: Not Applicable Date Met:  [birth year] Treatment Session 1; Completed 0 out of 3: At approximately 11:15 am, LRT met with patient in patient room. Patient refused to participate in recreational therapy 1:1 treatment session.   Leonette Monarch, LRT/CTRS 06.20.16 12:25 pm
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
## 2015-02-16 NOTE — Progress Notes (Signed)
Patient denies suicidal and homicidal ideation.she was not happy to go back to home.Refused to sign the discharged instructions.discharged instructions explained to mother,verbelized understanding.Patient left with family.

## 2015-02-16 NOTE — Progress Notes (Signed)
AVS  H&P Discharge faxed to [facility] for hospital follow-up

## 2015-04-16 ENCOUNTER — Other Ambulatory Visit: Payer: Self-pay | Admitting: Family Medicine

## 2015-04-16 DIAGNOSIS — Z1231 Encounter for screening mammogram for malignant neoplasm of breast: Secondary | ICD-10-CM

## 2015-10-15 ENCOUNTER — Other Ambulatory Visit: Payer: Self-pay | Admitting: Nurse Practitioner

## 2015-10-15 DIAGNOSIS — Z1231 Encounter for screening mammogram for malignant neoplasm of breast: Secondary | ICD-10-CM

## 2015-11-10 ENCOUNTER — Ambulatory Visit: Payer: Medicare Other | Attending: Nurse Practitioner

## 2016-09-16 DIAGNOSIS — Z9114 Patient's other noncompliance with medication regimen: Secondary | ICD-10-CM | POA: Insufficient documentation

## 2016-09-16 DIAGNOSIS — N390 Urinary tract infection, site not specified: Secondary | ICD-10-CM | POA: Insufficient documentation

## 2016-09-16 DIAGNOSIS — F4325 Adjustment disorder with mixed disturbance of emotions and conduct: Secondary | ICD-10-CM | POA: Diagnosis not present

## 2016-09-16 DIAGNOSIS — F17210 Nicotine dependence, cigarettes, uncomplicated: Secondary | ICD-10-CM | POA: Diagnosis not present

## 2016-09-16 DIAGNOSIS — F29 Unspecified psychosis not due to a substance or known physiological condition: Secondary | ICD-10-CM | POA: Insufficient documentation

## 2016-09-16 DIAGNOSIS — R4182 Altered mental status, unspecified: Secondary | ICD-10-CM | POA: Diagnosis present

## 2016-09-16 DIAGNOSIS — I10 Essential (primary) hypertension: Secondary | ICD-10-CM | POA: Insufficient documentation

## 2016-09-16 DIAGNOSIS — Z79899 Other long term (current) drug therapy: Secondary | ICD-10-CM | POA: Diagnosis not present

## 2016-09-17 ENCOUNTER — Inpatient Hospital Stay
Admission: EM | Admit: 2016-09-17 | Discharge: 2016-09-26 | DRG: 885 | Disposition: A | Discharge status: Home / Self Care | Payer: Medicare Other | Source: Intra-hospital | Attending: Psychiatry | Admitting: Psychiatry

## 2016-09-17 ENCOUNTER — Emergency Department
Admission: EM | Admit: 2016-09-17 | Discharge: 2016-09-17 | Disposition: A | Discharge status: Other Acute Inpatient Hospital | Payer: Medicare Other | Attending: Emergency Medicine | Admitting: Emergency Medicine

## 2016-09-17 ENCOUNTER — Encounter: Payer: Self-pay | Admitting: Psychiatry

## 2016-09-17 DIAGNOSIS — F4325 Adjustment disorder with mixed disturbance of emotions and conduct: Secondary | ICD-10-CM | POA: Diagnosis not present

## 2016-09-17 DIAGNOSIS — F329 Major depressive disorder, single episode, unspecified: Secondary | ICD-10-CM | POA: Diagnosis present

## 2016-09-17 DIAGNOSIS — G4700 Insomnia, unspecified: Secondary | ICD-10-CM | POA: Diagnosis present

## 2016-09-17 DIAGNOSIS — Z915 Personal history of self-harm: Secondary | ICD-10-CM

## 2016-09-17 DIAGNOSIS — F71 Moderate intellectual disabilities: Secondary | ICD-10-CM | POA: Diagnosis present

## 2016-09-17 DIAGNOSIS — N390 Urinary tract infection, site not specified: Secondary | ICD-10-CM

## 2016-09-17 DIAGNOSIS — I10 Essential (primary) hypertension: Secondary | ICD-10-CM | POA: Diagnosis present

## 2016-09-17 DIAGNOSIS — E538 Deficiency of other specified B group vitamins: Secondary | ICD-10-CM | POA: Diagnosis present

## 2016-09-17 DIAGNOSIS — I129 Hypertensive chronic kidney disease with stage 1 through stage 4 chronic kidney disease, or unspecified chronic kidney disease: Secondary | ICD-10-CM | POA: Diagnosis present

## 2016-09-17 DIAGNOSIS — F17210 Nicotine dependence, cigarettes, uncomplicated: Secondary | ICD-10-CM | POA: Diagnosis present

## 2016-09-17 DIAGNOSIS — F29 Unspecified psychosis not due to a substance or known physiological condition: Secondary | ICD-10-CM

## 2016-09-17 DIAGNOSIS — Z9114 Patient's other noncompliance with medication regimen: Secondary | ICD-10-CM | POA: Diagnosis not present

## 2016-09-17 DIAGNOSIS — F3132 Bipolar disorder, current episode depressed, moderate: Secondary | ICD-10-CM | POA: Diagnosis not present

## 2016-09-17 DIAGNOSIS — Z87440 Personal history of urinary (tract) infections: Secondary | ICD-10-CM

## 2016-09-17 DIAGNOSIS — F17200 Nicotine dependence, unspecified, uncomplicated: Secondary | ICD-10-CM | POA: Diagnosis present

## 2016-09-17 DIAGNOSIS — R45851 Suicidal ideations: Secondary | ICD-10-CM | POA: Diagnosis present

## 2016-09-17 DIAGNOSIS — F639 Impulse disorder, unspecified: Secondary | ICD-10-CM | POA: Diagnosis present

## 2016-09-17 DIAGNOSIS — Z818 Family history of other mental and behavioral disorders: Secondary | ICD-10-CM

## 2016-09-17 DIAGNOSIS — F419 Anxiety disorder, unspecified: Secondary | ICD-10-CM | POA: Diagnosis present

## 2016-09-17 DIAGNOSIS — N189 Chronic kidney disease, unspecified: Secondary | ICD-10-CM | POA: Diagnosis present

## 2016-09-17 DIAGNOSIS — F319 Bipolar disorder, unspecified: Principal | ICD-10-CM | POA: Diagnosis present

## 2016-09-17 LAB — URINALYSIS, COMPLETE (UACMP) WITH MICROSCOPIC
BILIRUBIN URINE: NEGATIVE
GLUCOSE, UA: NEGATIVE mg/dL
Hgb urine dipstick: NEGATIVE
Ketones, ur: NEGATIVE mg/dL
NITRITE: NEGATIVE
PH: 5 (ref 5.0–8.0)
Protein, ur: NEGATIVE mg/dL
SPECIFIC GRAVITY, URINE: 1.015 (ref 1.005–1.030)

## 2016-09-17 LAB — COMPREHENSIVE METABOLIC PANEL
ALBUMIN: 4.6 g/dL (ref 3.5–5.0)
ALT: 9 U/L — ABNORMAL LOW (ref 14–54)
ANION GAP: 6 (ref 5–15)
AST: 14 U/L — ABNORMAL LOW (ref 15–41)
Alkaline Phosphatase: 73 U/L (ref 38–126)
BILIRUBIN TOTAL: 0.4 mg/dL (ref 0.3–1.2)
BUN: 30 mg/dL — ABNORMAL HIGH (ref 6–20)
CO2: 26 mmol/L (ref 22–32)
Calcium: 10.1 mg/dL (ref 8.9–10.3)
Chloride: 109 mmol/L (ref 101–111)
Creatinine, Ser: 2.03 mg/dL — ABNORMAL HIGH (ref 0.44–1.00)
GFR calc Af Amer: 31 mL/min — ABNORMAL LOW (ref >60)
GFR calc non Af Amer: 26 mL/min — ABNORMAL LOW (ref >60)
GLUCOSE: 115 mg/dL — AB (ref 65–99)
POTASSIUM: 3.9 mmol/L (ref 3.5–5.1)
SODIUM: 141 mmol/L (ref 135–145)
TOTAL PROTEIN: 8.4 g/dL — AB (ref 6.5–8.1)

## 2016-09-17 LAB — URINE DRUG SCREEN, QUALITATIVE (ARMC ONLY)
Amphetamines, Ur Screen: NOT DETECTED
BARBITURATES, UR SCREEN: NOT DETECTED
Benzodiazepine, Ur Scrn: NOT DETECTED
COCAINE METABOLITE, UR ~~LOC~~: NOT DETECTED
Cannabinoid 50 Ng, Ur ~~LOC~~: NOT DETECTED
MDMA (ECSTASY) UR SCREEN: NOT DETECTED
Methadone Scn, Ur: NOT DETECTED
OPIATE, UR SCREEN: NOT DETECTED
PHENCYCLIDINE (PCP) UR S: NOT DETECTED
TRICYCLIC, UR SCREEN: POSITIVE — AB

## 2016-09-17 LAB — ACETAMINOPHEN LEVEL: Acetaminophen (Tylenol), Serum: <10 ug/mL — ABNORMAL LOW (ref 10–30)

## 2016-09-17 LAB — CBC
HCT: 38.9 % (ref 35.0–47.0)
HEMOGLOBIN: 13.1 g/dL (ref 12.0–16.0)
MCH: 31 pg (ref 26.0–34.0)
MCHC: 33.7 g/dL (ref 32.0–36.0)
MCV: 91.9 fL (ref 80.0–100.0)
Platelets: 280 10*3/uL (ref 150–440)
RBC: 4.24 MIL/uL (ref 3.80–5.20)
RDW: 13.7 % (ref 11.5–14.5)
WBC: 7.3 10*3/uL (ref 3.6–11.0)

## 2016-09-17 LAB — ETHANOL: Alcohol, Ethyl (B): <5 mg/dL (ref <5)

## 2016-09-17 LAB — SALICYLATE LEVEL

## 2016-09-17 MED ORDER — LAMOTRIGINE 100 MG PO TABS
150.0000 mg | ORAL_TABLET | Freq: Every day | ORAL | Status: DC
  Administered 2016-09-17: 150 mg via ORAL
  Filled 2016-09-17: qty 2

## 2016-09-17 MED ORDER — LISINOPRIL-HYDROCHLOROTHIAZIDE 10-12.5 MG PO TABS: 1.0000 | ORAL_TABLET | Freq: Every day | ORAL | Status: DC

## 2016-09-17 MED ORDER — HYDROCHLOROTHIAZIDE 25 MG PO TABS
25.0000 mg | ORAL_TABLET | Freq: Every day | ORAL | Status: DC
  Administered 2016-09-18 – 2016-09-26 (×9): 25 mg via ORAL
  Filled 2016-09-17 (×10): qty 1

## 2016-09-17 MED ORDER — LISINOPRIL 10 MG PO TABS
10.0000 mg | ORAL_TABLET | Freq: Every day | ORAL | Status: DC
  Administered 2016-09-17: 10 mg via ORAL
  Filled 2016-09-17: qty 1

## 2016-09-17 MED ORDER — ACETAMINOPHEN 325 MG PO TABS: 650.0000 mg | ORAL_TABLET | Freq: Four times a day (QID) | ORAL | Status: DC | PRN

## 2016-09-17 MED ORDER — ALUM & MAG HYDROXIDE-SIMETH 200-200-20 MG/5ML PO SUSP: 30.0000 mL | ORAL | Status: DC | PRN

## 2016-09-17 MED ORDER — TRAZODONE HCL 100 MG PO TABS
200.0000 mg | ORAL_TABLET | Freq: Every day | ORAL | Status: DC
  Administered 2016-09-17 – 2016-09-21 (×5): 200 mg via ORAL
  Filled 2016-09-17 (×6): qty 2

## 2016-09-17 MED ORDER — QUETIAPINE FUMARATE 100 MG PO TABS
100.0000 mg | ORAL_TABLET | Freq: Two times a day (BID) | ORAL | Status: DC
  Administered 2016-09-17 – 2016-09-26 (×18): 100 mg via ORAL
  Filled 2016-09-17 (×18): qty 1

## 2016-09-17 MED ORDER — MAGNESIUM HYDROXIDE 400 MG/5ML PO SUSP: 30.0000 mL | Freq: Every day | ORAL | Status: DC | PRN

## 2016-09-17 MED ORDER — FOSFOMYCIN TROMETHAMINE 3 G PO PACK
3.0000 g | PACK | Freq: Once | ORAL | Status: AC
  Administered 2016-09-17: 3 g via ORAL
  Filled 2016-09-17: qty 3

## 2016-09-17 MED ORDER — LAMOTRIGINE 25 MG PO TABS
25.0000 mg | ORAL_TABLET | Freq: Every day | ORAL | Status: DC
  Administered 2016-09-17: 25 mg via ORAL
  Filled 2016-09-17: qty 1

## 2016-09-17 MED ORDER — NICOTINE 21 MG/24HR TD PT24
21.0000 mg | MEDICATED_PATCH | Freq: Every day | TRANSDERMAL | Status: DC
  Filled 2016-09-17 (×2): qty 1

## 2016-09-17 MED ORDER — QUETIAPINE FUMARATE 25 MG PO TABS
50.0000 mg | ORAL_TABLET | Freq: Two times a day (BID) | ORAL | Status: DC
  Administered 2016-09-17 (×2): 50 mg via ORAL
  Filled 2016-09-17 (×2): qty 2

## 2016-09-17 MED ORDER — TRAZODONE HCL 100 MG PO TABS
200.0000 mg | ORAL_TABLET | Freq: Every day | ORAL | Status: DC
  Administered 2016-09-17: 200 mg via ORAL
  Filled 2016-09-17: qty 2

## 2016-09-17 NOTE — ED Notes (Signed)
BEHAVIORAL HEALTH ROUNDING Patient sleeping: Yes.   Patient alert and oriented: not applicable Behavior appropriate: Yes.  ; If no, describe:  Nutrition and fluids offered: No Toileting and hygiene offered: No Sitter present: not applicable Law enforcement present: Yes  

## 2016-09-17 NOTE — ED Notes (Addendum)
BEHAVIORAL HEALTH ROUNDING Patient sleeping: no Patient alert and oriented: yes Behavior appropriate: Yes.  ; If no, describe:  Nutrition and fluids offered: yes Toileting and hygiene offered: yes Sitter present: not applicable Law enforcement present: Yes

## 2016-09-17 NOTE — ED Notes (Signed)
TTS with pt att 

## 2016-09-17 NOTE — ED Notes (Signed)
This RN spoke with Dr. Enrigue CatenaShaheen psych/SOC consult;

## 2016-09-17 NOTE — BH Assessment (Signed)
[facility] .  Documentation in the epic chart reports that the patient has a full scale IQ of 59.    The [facility]  [facility]
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
## 2016-09-17 NOTE — Tx Team (Signed)
Initial Treatment Plan 09/17/2016 8:58 PM Jenna AblesLinda Jain WUJ:[id number]    PATIENT STRESSORS: Marital or family conflict Medication change or noncompliance   PATIENT STRENGTHS: Communication skills Motivation for treatment/growth   PATIENT IDENTIFIED PROBLEMS: Depression  Suicidal thoughts                   DISCHARGE CRITERIA:  Adequate post-discharge living arrangements Improved stabilization in mood, thinking, and/or behavior  PRELIMINARY DISCHARGE PLAN: Outpatient therapy Participate in family therapy Placement in alternative living arrangements  PATIENT/FAMILY INVOLVEMENT: This treatment plan has been presented to and reviewed with the patient, Jenna AblesLinda Riggs.  The patient has  been given the opportunity to ask questions and make suggestions.  Olin PiaByungura, Tariyah Pendry, RN 09/17/2016, 8:58 PM

## 2016-09-17 NOTE — ED Notes (Signed)
SOC/psych consult in progress

## 2016-09-17 NOTE — ED Notes (Signed)
Report to behavioral, will discharge patient from this chart and transfer patient to behavioral unit with security.

## 2016-09-17 NOTE — ED Notes (Signed)
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
## 2016-09-17 NOTE — Progress Notes (Signed)
1945: Patient newly admitted. Currently in the dayroom with staff and peers. Alert and oriented, pleasant and cooperative. No sign of distress. Safety maintained.

## 2016-09-17 NOTE — BH Assessment (Signed)
Dr. Toni Amendlapacs is the accepting physician. [facility] Writer informed the ER RN. Writer contacted [facility].   Writer spoke to the legal guardian Melida QuitterDonna Schlink.  The legal guardian is in support [facility].

## 2016-09-17 NOTE — ED Notes (Signed)
Patient awake, discussing plans to adopt children when she "gets myself right". States slept fair.

## 2016-09-18 DIAGNOSIS — F17200 Nicotine dependence, unspecified, uncomplicated: Secondary | ICD-10-CM

## 2016-09-18 DIAGNOSIS — F71 Moderate intellectual disabilities: Secondary | ICD-10-CM

## 2016-09-18 DIAGNOSIS — F639 Impulse disorder, unspecified: Secondary | ICD-10-CM

## 2016-09-18 DIAGNOSIS — I10 Essential (primary) hypertension: Secondary | ICD-10-CM

## 2016-09-18 MED ORDER — NICOTINE POLACRILEX 2 MG MT GUM
2.0000 mg | CHEWING_GUM | OROMUCOSAL | Status: DC | PRN
  Filled 2016-09-18: qty 1

## 2016-09-18 MED ORDER — VALPROIC ACID 250 MG PO CAPS
750.0000 mg | ORAL_CAPSULE | Freq: Every day | ORAL | Status: DC
  Administered 2016-09-18 – 2016-09-25 (×8): 750 mg via ORAL
  Filled 2016-09-18 (×8): qty 3

## 2016-09-18 MED ORDER — VALPROIC ACID 250 MG PO CAPS
500.0000 mg | ORAL_CAPSULE | Freq: Every morning | ORAL | Status: DC
  Administered 2016-09-18 – 2016-09-26 (×9): 500 mg via ORAL
  Filled 2016-09-18 (×11): qty 2

## 2016-09-18 NOTE — [facility] Group Notes (Signed)
[facility] Group Notes:  (Nursing/MHT/Case Management/Adjunct)  Date:  09/18/2016  Time:  5:21 AM  Type of Therapy:  Psychoeducational Skills  Participation Level:  Active  Participation Quality:  Appropriate  Affect:  Appropriate  Cognitive:  Appropriate  Insight:  Appropriate  Engagement in Group:  Engaged  Modes of Intervention:  Discussion, Socialization and Support  Summary of Progress/Problems:  Chancy MilroyLaquanda Y Jaycob Mcclenton 09/18/2016, 5:21 AM

## 2016-09-18 NOTE — Progress Notes (Signed)
Patient currently in bed, sleeping soundly. No sign of discomfort. Safety and security maintained.

## 2016-09-18 NOTE — Plan of Care (Signed)
Problem: Safety: Goal: Ability to disclose and discuss suicidal ideas will improve Outcome: Progressing Denies SI/HI   

## 2016-09-18 NOTE — Progress Notes (Signed)
Patient with sad affect, cooperative behavior meals, meds and plan of care. No SI/HI at this time. Patient with mumbly speech and low frustration level. Able to verbalize her needs, does get frustrated when limits are set or unit rules are followed. Social with peers in [place]. Safety maintained.

## 2016-09-18 NOTE — [facility] Group Notes (Signed)
[facility] LCSW Group Therapy  09/18/2016 12:26 PM  Type of Therapy:  Group Therapy  Participation Level:  Active  Participation Quality:  Appropriate  Affect:  Appropriate  Cognitive:  Alert  Insight:  Developing/Improving  Engagement in Therapy:  Engaged  Modes of Intervention:  Clarification, Discussion, Education, Limit-setting, Problem-solving, Reality Testing, Socialization and Support  Summary of Progress/Problems: Emotional Regulation: Patients will identify both negative and positive emotions. They will discuss emotions they have difficulty regulating and how they impact their lives. Patients will be asked to identify healthy coping skills to combat unhealthy reactions to negative emotions. This patient was able to reflect the emotion of anger and how she needs to work on it. Patient was able to follow discussion but needed some gentle redirection to get back on topic. Her peers were supportive and made her feel welcome.   Jenna Riggs M. Johnella MoloneyBandi, Alexander MtLCSW 09/18/2016
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
## 2016-09-18 NOTE — [facility] Suicide Risk Assessment (Signed)
[facility] INPATIENT:  Family/Significant Other Suicide Prevention Education  Suicide Prevention Education:  Education Completed; Lupita LeashDonna Helm(sister-in-law/guardian [phone]), has been identified by the patient as the family member/significant other with whom the patient will be residing, and identified as the person(s) who will aid the patient in the event of a mental health crisis (suicidal ideations/suicide attempt).  With written consent from the patient, the family member/significant other has been provided the following suicide prevention education, prior to the and/or following the discharge of the patient.   The suicide prevention education provided includes the following:  Suicide risk factors  Suicide prevention and interventions  National Suicide Hotline telephone number  [facility] telephone number  [facility] Assistance 911  [facility] and/or [facility]idential Mobile Crisis Unit telephone number  Request made of family/significant other to:  Remove weapons (e.g., guns, rifles, knives), all items previously/currently identified as safety concern.    Remove drugs/medications (over-the-counter, prescriptions, illicit drugs), all items previously/currently identified as a safety concern.  The family member/significant other verbalizes understanding of the suicide prevention education information provided.  The family member/significant other agrees to remove the items of safety concern listed above.  Jerren Flinchbaugh G. Garnette CzechSampson MSW, LCSWA 09/18/2016, 4:02 PM

## 2016-09-18 NOTE — Plan of Care (Signed)
Problem: Coping: Goal: Ability to verbalize frustrations and anger appropriately will improve Outcome: Progressing Visible in the milieu, expressing her feelings and concerns appropriately

## 2016-09-18 NOTE — Progress Notes (Signed)
Patient is currently out of bed and visible in the milieu. Alert and oriented and denying suicidal/homicidal thoughts. Adjusting well on the unit: [place] and reporting that she slept fine. No sign of discomfort. Support and encouragements provided. Safety maintained.
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
## 2016-09-19 DIAGNOSIS — F3132 Bipolar disorder, current episode depressed, moderate: Secondary | ICD-10-CM

## 2016-09-19 LAB — BASIC METABOLIC PANEL
ANION GAP: 10 (ref 5–15)
BUN: 29 mg/dL — ABNORMAL HIGH (ref 6–20)
CHLORIDE: 106 mmol/L (ref 101–111)
CO2: 24 mmol/L (ref 22–32)
CREATININE: 1.95 mg/dL — AB (ref 0.44–1.00)
Calcium: 10 mg/dL (ref 8.9–10.3)
GFR calc Af Amer: 32 mL/min — ABNORMAL LOW (ref >60)
GFR calc non Af Amer: 28 mL/min — ABNORMAL LOW (ref >60)
Glucose, Bld: 90 mg/dL (ref 65–99)
Potassium: 4.1 mmol/L (ref 3.5–5.1)
Sodium: 140 mmol/L (ref 135–145)

## 2016-09-19 LAB — VITAMIN B12: VITAMIN B 12: 259 pg/mL (ref 180–914)

## 2016-09-19 LAB — FOLATE: Folate: 9.7 ng/mL (ref >5.9)

## 2016-09-19 LAB — LIPID PANEL
CHOL/HDL RATIO: 3.3 ratio
Cholesterol: 144 mg/dL (ref 0–200)
HDL: 44 mg/dL (ref >40)
LDL Cholesterol: 75 mg/dL (ref 0–99)
Triglycerides: 123 mg/dL (ref <150)
VLDL: 25 mg/dL (ref 0–40)

## 2016-09-19 LAB — TSH: TSH: 2.937 u[IU]/mL (ref 0.350–4.500)

## 2016-09-19 NOTE — Progress Notes (Signed)
D: Patient is alert and  Has MR on the unit this shift. Patient not attended and actively participated in groups today. Patient denies suicidal ideation, homicidal ideation, auditory or visual hallucinations at the present time.  A: Scheduled medications are administered to patient as per MD orders. Emotional support and encouragement are provided. Patient is maintained on q.15 minute safety checks. Patient is informed to notify staff with questions or concerns. R: No adverse medication reactions are noted. Patient is cooperative with medication administration  . Patient is receptive, calm,anxious and cooperative on the unit at this time. Patient does not  Interact  with others on the unit this shift. Patient contracts for safety at this time. Patient remains safe at this time. Patient unable to answer level of depression or anxiety

## 2016-09-19 NOTE — Progress Notes (Signed)
Pleasant and cooperative.  Denies SI/HI/AVH.  Only verbalized concern is that she wants to go to a group home instead of returning home when discharged.  Support and encouragement offered.  Safety maintained.

## 2016-09-19 NOTE — [facility] Group Notes (Signed)
[facility] LCSW Group Therapy Note  Date/Time: 1:00pm  Type of Therapy and Topic:  Group Therapy:  Overcoming Obstacles  Participation Level:  Active  Description of Group:    In this group patients will be encouraged to explore what they see as obstacles to their own wellness and recovery. They will be guided to discuss their thoughts, feelings, and behaviors related to these obstacles. The group will process together ways to cope with barriers, with attention given to specific choices patients can make. Each patient will be challenged to identify changes they are motivated to make in order to overcome their obstacles. This group will be process-oriented, with patients participating in exploration of their own experiences as well as giving and receiving support and challenge from other group members.  Therapeutic Goals: 1. Patient will identify personal and current obstacles as they relate to admission. 2. Patient will identify barriers that currently interfere with their wellness or overcoming obstacles.  3. Patient will identify feelings, thought process and behaviors related to these barriers. 4. Patient will identify two changes they are willing to make to overcome these obstacles:    Summary of Patient Progress  Pt able to achieve the above therapeutic goals.  Identifying conflict at home between family members as the main obstacle and source of stress for her on admission and she is discussing that she is considering group home placement as an option to reduce exposure to triggers.   Therapeutic Modalities:   Cognitive Behavioral Therapy Solution Focused Therapy Motivational Interviewing Relapse Prevention Therapy  Jake SharkSara Hashem Goynes, MSW, LCSW

## 2016-09-19 NOTE — [facility] Group Notes (Signed)
[facility] Group Notes:  (Nursing/MHT/Case Management/Adjunct)  Date:  09/19/2016  Time:  9:44 PM  Type of Therapy:  Psychoeducational Skills  Participation Level:  Active  Participation Quality:  Appropriate  Affect:  Appropriate  Cognitive:  Alert  Insight:  Good  Engagement in Group:  Engaged  Modes of Intervention:  Activity  Summary of Progress/Problems:  Mayra NeerJackie L Shell Yandow 09/19/2016, 9:44 PM
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
## 2016-09-19 NOTE — Plan of Care (Signed)
Problem: Coping: Goal: Ability to verbalize frustrations and anger appropriately will improve Outcome: Not Progressing Patient not able to verbalize frustrations at this time [place] RN   

## 2016-09-20 DIAGNOSIS — F319 Bipolar disorder, unspecified: Principal | ICD-10-CM

## 2016-09-20 DIAGNOSIS — E538 Deficiency of other specified B group vitamins: Secondary | ICD-10-CM | POA: Diagnosis present

## 2016-09-20 LAB — URINALYSIS, COMPLETE (UACMP) WITH MICROSCOPIC
Bacteria, UA: NONE SEEN
Bilirubin Urine: NEGATIVE
GLUCOSE, UA: NEGATIVE mg/dL
Hgb urine dipstick: NEGATIVE
Ketones, ur: NEGATIVE mg/dL
Leukocytes, UA: NEGATIVE
Nitrite: NEGATIVE
PH: 5 (ref 5.0–8.0)
PROTEIN: NEGATIVE mg/dL
Specific Gravity, Urine: 1.005 (ref 1.005–1.030)

## 2016-09-20 LAB — HEMOGLOBIN A1C
HEMOGLOBIN A1C: 5.3 % (ref 4.8–5.6)
MEAN PLASMA GLUCOSE: 105 mg/dL

## 2016-09-20 LAB — PROLACTIN: PROLACTIN: 12.8 ng/mL (ref 4.8–23.3)

## 2016-09-20 MED ORDER — CYANOCOBALAMIN 1000 MCG/ML IJ SOLN
1000.0000 ug | Freq: Every day | INTRAMUSCULAR | Status: DC
  Filled 2016-09-20 (×3): qty 1

## 2016-09-20 NOTE — Plan of Care (Signed)
Problem: Activity: Goal: Sleeping patterns will improve Outcome: Progressing Patient slept for Estimated Hours of 5.45; every 15 minutes safety round maintained, no injury or falls during this shift.    

## 2016-09-20 NOTE — [facility] Group Notes (Signed)
[facility] Group Notes:  (Nursing/MHT/Case Management/Adjunct)  Date:  09/20/2016  Time:  2:13 PM  Type of Therapy:  Psychoeducational Skills  Participation Level:  Active  Participation Quality:  Appropriate  Affect:  Appropriate  Cognitive:  Appropriate  Insight:  Appropriate  Engagement in Group:  Engaged  Modes of Intervention:  Activity, Confrontation and Education  Summary of Progress/Problems:  Jenna Riggs M Jenna Riggs 09/20/2016, 2:13 PM

## 2016-09-20 NOTE — Tx Team (Signed)
Interdisciplinary Treatment and Diagnostic Plan Update  09/20/2016 Time of Session: 1040 Jenna Riggs MRN: [id number]  Principal Diagnosis: Bipolar disorder [facility])  Secondary Diagnoses: Principal Problem:   Bipolar disorder (HCC) Active Problems:   Moderate intellectual disability   HTN (hypertension)   Impulse control disorder (skin picking)   Tobacco use disorder   UTI (urinary tract infection)   Current Medications:  Current Facility-Administered Medications  Medication Dose Route Frequency Provider Last Rate Last Dose  . acetaminophen (TYLENOL) tablet 650 mg  650 mg Oral Q6H PRN Jolanta B Pucilowska, MD      . alum & mag hydroxide-simeth (MAALOX/MYLANTA) 200-200-20 MG/5ML suspension 30 mL  30 mL Oral Q4H PRN Jolanta B Pucilowska, MD      . hydrochlorothiazide (HYDRODIURIL) tablet 25 mg  25 mg Oral Daily Jolanta B Pucilowska, MD   25 mg at 09/20/16 0837  . magnesium hydroxide (MILK OF MAGNESIA) suspension 30 mL  30 mL Oral Daily PRN Jolanta B Pucilowska, MD      . nicotine (NICODERM CQ - dosed in mg/24 hours) patch 21 mg  21 mg Transdermal Q0600 Jolanta B Pucilowska, MD      . nicotine polacrilex (NICORETTE) gum 2 mg  2 mg Oral PRN Darliss Ridgel, MD      . QUEtiapine (SEROQUEL) tablet 100 mg  100 mg Oral BID Shari Prows, MD   100 mg at 09/20/16 0836  . traZODone (DESYREL) tablet 200 mg  200 mg Oral QHS Shari Prows, MD   200 mg at 09/19/16 2159  . valproic acid (DEPAKENE) 250 MG capsule 500 mg  500 mg Oral q morning - 10a Darliss Ridgel, MD   500 mg at 09/20/16 4098  . valproic acid (DEPAKENE) 250 MG capsule 750 mg  750 mg Oral QHS Darliss Ridgel, MD   750 mg at 09/19/16 2200   PTA Medications: Prescriptions Prior to Admission  Medication Sig Dispense Refill Last Dose  . lamoTRIgine (LAMICTAL) 150 MG tablet Take 1 tablet by mouth daily.     Marland Kitchen lisinopril (PRINIVIL,ZESTRIL) 20 MG tablet Take 1 tablet by mouth daily.     . QUEtiapine (SEROQUEL) 100 MG tablet Take 1  tablet by mouth 2 (two) times daily.     . traZODone (DESYREL) 100 MG tablet Take 200 mg by mouth at bedtime.   unknown at unknown    Patient Stressors: Marital or family conflict Medication change or noncompliance  Patient Strengths: Barrister's clerk for treatment/growth  Treatment Modalities: Medication Management, Group therapy, Case management,  1 to 1 session with clinician, Psychoeducation, Recreational therapy.   Physician Treatment Plan for Primary Diagnosis: Bipolar disorder (HCC) Long Term Goal(s): Improvement in symptoms so as ready for discharge Improvement in symptoms so as ready for discharge   Short Term Goals: Ability to verbalize feelings will improve Ability to demonstrate self-control will improve Ability to identify and develop effective coping behaviors will improve Compliance with prescribed medications will improve Ability to demonstrate self-control will improve Ability to identify and develop effective coping behaviors will improve Compliance with prescribed medications will improve  Medication Management: Evaluate patient's response, side effects, and tolerance of medication regimen.  Therapeutic Interventions: 1 to 1 sessions, Unit Group sessions and Medication administration.  Evaluation of Outcomes: Progressing  Physician Treatment Plan for Secondary Diagnosis: Principal Problem:   Bipolar disorder (HCC) Active Problems:   Moderate intellectual disability   HTN (hypertension)   Impulse control disorder (skin picking)   Tobacco use disorder  UTI (urinary tract infection)  Long Term Goal(s): Improvement in symptoms so as ready for discharge Improvement in symptoms so as ready for discharge   Short Term Goals: Ability to verbalize feelings will improve Ability to demonstrate self-control will improve Ability to identify and develop effective coping behaviors will improve Compliance with prescribed medications will  improve Ability to demonstrate self-control will improve Ability to identify and develop effective coping behaviors will improve Compliance with prescribed medications will improve     Medication Management: Evaluate patient's response, side effects, and tolerance of medication regimen.  Therapeutic Interventions: 1 to 1 sessions, Unit Group sessions and Medication administration.  Evaluation of Outcomes: Progressing   RN Treatment Plan for Primary Diagnosis: Bipolar disorder (HCC) Long Term Goal(s): Knowledge of disease and therapeutic regimen to maintain health will improve  Short Term Goals: Ability to verbalize frustration and anger appropriately will improve, Ability to disclose and discuss suicidal ideas, Ability to identify and develop effective coping behaviors will improve and Compliance with prescribed medications will improve  Medication Management: RN will administer medications as ordered by provider, will assess and evaluate patient's response and provide education to patient for prescribed medication. RN will report any adverse and/or side effects to prescribing provider.  Therapeutic Interventions: 1 on 1 counseling sessions, Psychoeducation, Medication administration, Evaluate responses to treatment, Monitor vital signs and CBGs as ordered, Perform/monitor CIWA, COWS, AIMS and Fall Risk screenings as ordered, Perform wound care treatments as ordered.  Evaluation of Outcomes: Progressing   LCSW Treatment Plan for Primary Diagnosis: Bipolar disorder (HCC) Long Term Goal(s): Safe transition to appropriate next level of care at discharge, Engage patient in therapeutic group addressing interpersonal concerns.  Short Term Goals: Engage patient in aftercare planning with referrals and resources, Increase social support, Increase ability to appropriately verbalize feelings, Increase emotional regulation, Facilitate acceptance of mental health diagnosis and concerns and Increase  skills for wellness and recovery  Therapeutic Interventions: Assess for all discharge needs, 1 to 1 time with Social worker, Explore available resources and support systems, Assess for adequacy in community support network, Educate family and significant other(s) on suicide prevention, Complete Psychosocial Assessment, Interpersonal group therapy.  Evaluation of Outcomes: Progressing   Progress in Treatment: Attending groups: Yes. Participating in groups: Yes. Taking medication as prescribed: Yes. Toleration medication: Yes. Family/Significant other contact made: Yes, individual(s) contacted:  sister/guardian Patient understands diagnosis: Yes. Discussing patient identified problems/goals with staff: Yes. Medical problems stabilized or resolved: Yes. Denies suicidal/homicidal ideation: Yes. Issues/concerns per patient self-inventory: Yes. Other: none  New problem(s) identified: No, Describe:  none  New Short Term/Long Term Goal(s):none  Discharge Plan or Barriers: CSW assessing for appropriate discharge plan.  Reason for Continuation of Hospitalization: Aggression Depression  Estimated Length of Stay:3-5 days  Attendees: Patient: Jenna AblesLinda Riggs 09/20/2016   Physician: Dr Jennet MaduroPucilowska, MD 09/20/2016  Nursing: Elenore PaddyJennifer Morrow, RN 09/20/2016   RN Care Manager: 09/20/2016   Social Worker: Daleen SquibbGreg Avrey Hyser, LCSW 09/20/2016   Recreational Therapist: Nolon LennertBeth Green 09/20/2016   Other:  09/20/2016   Other:  09/20/2016   Other: 09/20/2016        Scribe for Treatment Team: Lorri FrederickWierda, Maily Debarge Jon, LCSW 09/20/2016 2:03 PM

## 2016-09-20 NOTE — Progress Notes (Signed)
2250: Patient stayed around, calm and cooperative. Presented to the medication room. Refused her Vit-B12 injections "I don't take vitamins ". Patient took her other medications voluntarily. Has just gone to bed. Staff will continue to monitor.

## 2016-09-20 NOTE — Progress Notes (Signed)
Patient aware urine sample is requested.

## 2016-09-20 NOTE — Progress Notes (Signed)
Patient ID: Jenna AblesLinda Roebuck, female   DOB: 1961/03/28, 56 y.o.   MRN: [id number] Visible in the milieu, appeared to be in harmony with others, disoriented to place and time, noted for thought blocking "closes eyes, thinking hard, shaking her head ... I don't want to do this right now, not now" and walked away, anxious and refused to carry on any long conversation, blank and flat affect but medication compliant.

## 2016-09-20 NOTE — [facility] Group Notes (Signed)
[facility] Group Notes:  (Nursing/MHT/Case Management/Adjunct) *Late Entry for 09/19/16  Date:  09/20/2016  Time:  9:42 AM  Type of Therapy:  Psychoeducational Skills  Participation Level:  Active  Participation Quality:  Appropriate, Attentive and Sharing  Affect:  Appropriate  Cognitive:  Alert and Appropriate  Insight:  Appropriate  Engagement in Group:  Engaged  Modes of Intervention:  Discussion, Education and Support  Summary of Progress/Problems:  Lynelle SmokeCara Travis [facility]ochesMadoni 09/20/2016, 9:42 AM

## 2016-09-20 NOTE — Progress Notes (Signed)
Patient with appropriate affect, cooperative behavior with meals, meds and plan of care. Mood is bright, patient is social with peers. Verbalizes needs appropriately with Clinical research associatewriter. No SI/HI at this time. Safety is maintained.

## 2016-09-20 NOTE — Plan of Care (Signed)
Problem: Coping: Goal: Ability to cope will improve Outcome: Progressing Emotional support provided. Patient able to express her thoughts and feelings. Attending groups as scheduled
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
## 2016-09-20 NOTE — Plan of Care (Signed)
Problem: Activity: Goal: Interest or engagement in leisure activities will improve Outcome: Progressing Was seen in group activities, engaged, cooperative.

## 2016-09-20 NOTE — Progress Notes (Signed)
Recreation Therapy Notes  Date: 01.23.18 Time: 9:30 am Location: Craft Room  Group Topic: Goal Setting  Goal Area(s) Addresses:  Patient will write at least one goal. Patient will write at least one obstacle.  Behavioral Response: Attentive  Intervention: Recovery Goal Chart  Activity: Patients were instructed to make a Recovery Goal Chart including goals, obstacles, the date they started working on their goals, and the date they achieved their goals.  Education: LRT educated patients on healthy ways to celebrate reaching their goals.  Education Outcome: In group clarification offered  Clinical Observations/Feedback: Patient initially did not want to participate in group activity. Later patient decided to participate. Patient did not contribute to group discussion.  Jacquelynn CreeGreene,Zander Ingham M, LRT/CTRS 09/20/2016 10:21 AM

## 2016-09-20 NOTE — [facility] Group Notes (Signed)
[facility] LCSW Group Therapy Note  Date/Time: 09/20/2016, 3pm  Type of Therapy/Topic:  Group Therapy:  Feelings about Diagnosis  Participation Level:  Minimal   Mood: Poor reported: Good mood, excited at this possibilty of going to a group home.   Description of Group:    This group will allow patients to explore their thoughts and feelings about diagnoses they have received. Patients will be guided to explore their level of understanding and acceptance of these diagnoses. Facilitator will encourage patients to process their thoughts and feelings about the reactions of others to their diagnosis, and will guide patients in identifying ways to discuss their diagnosis with significant others in their lives. This group will be process-oriented, with patients participating in exploration of their own experiences as well as giving and receiving support and challenge from other group members.   Therapeutic Goals: 1. Patient will demonstrate understanding of diagnosis as evidence by identifying two or more symptoms of the disorder:  2. Patient will be able to express two feelings regarding the diagnosis 3. Patient will demonstrate ability to communicate their needs through discussion and/or role plays  Summary of Patient Progress:  Pt able to achieve the above therapeutic goals.  Able to describe the conflict and her feelings about it with her family.    Therapeutic Modalities:   Cognitive Behavioral Therapy Brief Therapy Feelings Identification '  Jake SharkSara Syrus Nakama, MSW, LCSW

## 2016-09-20 NOTE — Progress Notes (Signed)
2000: patient currently in the day room with staff and peers. Alert and oriented and denying suicidal thoughts. Denies homicidal thoughts. Pleasant and cooperative. No sign of discomfort. Reports that she is feeling improved in mood. Has no concern. Was encouraged to discuss her concerns with staff. Emotional support provided. Safety precautions maintained.

## 2016-09-20 NOTE — Plan of Care (Signed)
Problem: Safety: Goal: Ability to remain free from injury will improve Outcome: Progressing Denies SI/HI   

## 2016-09-21 MED ORDER — TUBERCULIN PPD 5 UNIT/0.1ML ID SOLN
5.0000 [IU] | Freq: Once | INTRADERMAL | Status: DC
  Administered 2016-09-21: 5 [IU] via INTRADERMAL
  Filled 2016-09-21: qty 0.1

## 2016-09-21 NOTE — Progress Notes (Signed)
09/21/16 1140 TC Jenna AblesLinda Riggs.  Mrs. Jenna Riggs said that her husband, Jenna RuizJohn, who is pt's brother and legal guardian, does not want pt to go to a group home.  Pt has gone to group homes before and has always wanted to return home after a very short stay.  He would like for [facility] to address her medication issues and then he wants her to remain in his home. Jenna SquibbGreg Benedetta Sundstrom, LCSW

## 2016-09-21 NOTE — Plan of Care (Signed)
Problem: Medication: Goal: Compliance with prescribed medication regimen will improve Outcome: Not Progressing Pt refusing b12 injections

## 2016-09-21 NOTE — Progress Notes (Signed)
Patient rested all night. There was no concern. Woke up to get vital signs checked then returned to bed. Had no concern: Pleasant and cooperative. Staff continue to monitor.

## 2016-09-21 NOTE — Progress Notes (Signed)
Patient calm and cooperative. Bright affect. Patient is social with peers. Verbalizes needs appropriately with Clinical research associatewriter. No SI/HI at this time.  Encouragement and support offered. Safety checks  Maintained. Pt receptive and remains safe on unit with q 15 min checks.
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
## 2016-09-21 NOTE — [facility] Suicide Risk Assessment (Addendum)
[facility] INPATIENT:  Family/Significant Other Suicide Prevention Education  Suicide Prevention Education: late entry note Education Completed; Melida QuitterDonna Puckett, sister/guardian [phone],  has been identified by the patient as the family member/significant other with whom the patient will be residing, and identified as the person(s) who will aid the patient in the event of a mental health crisis (suicidal ideations/suicide attempt).  With written consent from the patient, the family member/significant other has been provided the following suicide prevention education, prior to the and/or following the discharge of the patient.  The suicide prevention education provided includes the following:  Suicide risk factors  Suicide prevention and interventions  National Suicide Hotline telephone number  [facility] assessment telephone number  [facility] Emergency Assistance 911  [facility] and/or Residential Mobile Crisis Unit telephone number  Request made of family/significant other to:  Remove weapons (e.g., guns, rifles, knives), all items previously/currently identified as safety concern.    Remove drugs/medications (over-the-counter, prescriptions, illicit drugs), all items previously/currently identified as a safety concern.  The family member/significant other verbalizes understanding of the suicide prevention education information provided.  The family member/significant other agrees to remove the items of safety concern listed above.  Lupita LeashDonna provided the following information:  Pt has been in a number of group homes previously.  [place] and pt was in several group homes there.  Pt each time has stayed for less than a month and then wanted to return home.    Lorri FrederickWierda, Amauris Debois Jon, LCSW 09/20/16, 1500

## 2016-09-21 NOTE — Progress Notes (Signed)
Recreation Therapy Notes  Date: 01.24.18 Time: 9:30 am Location: Craft Room  Group Topic: Self-esteem  Goal Area(s) Addresses:  Patient will write at least one positive trait about self. Patient will verbalize benefit of having a healthy self-esteem.  Behavioral Response: Attentive, Interactive  Intervention: I Am  Activity: Patients were given a worksheet with the letter I on it and were instructed to write as many positive traits about themselves inside the letter.   Education: LRT educated patients on ways to increase their self-esteem.  Education Outcome: Acknowledges education/In group clarification offered  Clinical Observations/Feedback: Patient wrote positive traits about self with spelling assistance from LRT. Patient contributed to group discussion by stating it was easy to think of positive traits and how she can increase her self-esteem.  Jacquelynn CreeGreene,Minerva Bluett M, LRT/CTRS 09/21/2016 10:11 AM

## 2016-09-21 NOTE — Progress Notes (Signed)
Patient ID: Jenna AblesLinda Goonan, female   DOB: [birth year], 56 y.o.   MRN: [id number]  CSW spoke with [name], group home manager of [facility]. [name] stated they have current bed openings for both females and males. Stated group home owner Lynett FishLatyoya would be by this afternoon to assess patient for potential group home placement.   Penn Grissett G. Garnette CzechSampson MSW, [facility] 09/21/2016 10:26 AM
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
## 2016-09-21 NOTE — [facility] Group Notes (Signed)
  [facility]  Date/Time: 09/21/16 1300  Type of Therapy/Topic:  Group Therapy:  Emotion Regulation  Participation Level:  Active   Mood: tearful, upset  Description of Group:    The purpose of this group is to assist patients in learning to regulate negative emotions and experience positive emotions. Patients will be guided to discuss ways in which they have been vulnerable to their negative emotions. These vulnerabilities will be juxtaposed with experiences of positive emotions or situations, and patients challenged to use positive emotions to combat negative ones. Special emphasis will be placed on coping with negative emotions in conflict situations, and patients will process healthy conflict resolution skills.  Therapeutic Goals: 1. Patient will identify two positive emotions or experiences to reflect on in order to balance out negative emotions:  2. Patient will label two or more emotions that they find the most difficult to experience:  3. Patient will be able to demonstrate positive conflict resolution skills through discussion or role plays:   Summary of Patient Progress:pt came to group immediately after receiving news that she would not be allowed by her guardian to pursue group home placement but would be returning to the guardian home upon discharge.  Pt cried and talked about the situation being unfair but was able to talk about her frustration and handled the whole situation appropriately.  Pt was not able to engage much with the material and discussion of the group.     Therapeutic Modalities:   Cognitive Behavioral Therapy Feelings Identification Dialectical Behavioral Therapy  Daleen SquibbGreg Jacelynn Hayton, LCSW

## 2016-09-22 LAB — CBC
HCT: 33.7 % — ABNORMAL LOW (ref 35.0–47.0)
Hemoglobin: 11.8 g/dL — ABNORMAL LOW (ref 12.0–16.0)
MCH: 31.6 pg (ref 26.0–34.0)
MCHC: 35.1 g/dL (ref 32.0–36.0)
MCV: 90 fL (ref 80.0–100.0)
Platelets: 222 10*3/uL (ref 150–440)
RBC: 3.75 MIL/uL — AB (ref 3.80–5.20)
RDW: 13.1 % (ref 11.5–14.5)
WBC: 5.3 10*3/uL (ref 3.6–11.0)

## 2016-09-22 LAB — COMPREHENSIVE METABOLIC PANEL
ALT: 10 U/L — AB (ref 14–54)
AST: 16 U/L (ref 15–41)
Albumin: 3.7 g/dL (ref 3.5–5.0)
Alkaline Phosphatase: 62 U/L (ref 38–126)
Anion gap: 7 (ref 5–15)
BILIRUBIN TOTAL: 0.5 mg/dL (ref 0.3–1.2)
BUN: 28 mg/dL — AB (ref 6–20)
CO2: 29 mmol/L (ref 22–32)
CREATININE: 1.61 mg/dL — AB (ref 0.44–1.00)
Calcium: 9.4 mg/dL (ref 8.9–10.3)
Chloride: 101 mmol/L (ref 101–111)
GFR, EST AFRICAN AMERICAN: 41 mL/min — AB (ref >60)
GFR, EST NON AFRICAN AMERICAN: 35 mL/min — AB (ref >60)
Glucose, Bld: 96 mg/dL (ref 65–99)
Potassium: 3.8 mmol/L (ref 3.5–5.1)
Sodium: 137 mmol/L (ref 135–145)
TOTAL PROTEIN: 7 g/dL (ref 6.5–8.1)

## 2016-09-22 LAB — VALPROIC ACID LEVEL: Valproic Acid Lvl: 88 ug/mL (ref 50.0–100.0)

## 2016-09-22 LAB — AMMONIA: AMMONIA: 24 umol/L (ref 9–35)

## 2016-09-22 MED ORDER — QUETIAPINE FUMARATE 100 MG PO TABS: 100.0000 mg | ORAL_TABLET | Freq: Two times a day (BID) | ORAL | 1 refills | Status: DC

## 2016-09-22 MED ORDER — TRAZODONE HCL 300 MG PO TABS: 300.0000 mg | ORAL_TABLET | Freq: Every day | ORAL | 1 refills | Status: DC

## 2016-09-22 MED ORDER — VALPROIC ACID 250 MG PO CAPS: 750.0000 mg | ORAL_CAPSULE | Freq: Every day | ORAL | 1 refills | Status: DC

## 2016-09-22 MED ORDER — HYDROCHLOROTHIAZIDE 25 MG PO TABS: 25.0000 mg | ORAL_TABLET | Freq: Every day | ORAL | 1 refills | Status: DC

## 2016-09-22 MED ORDER — VITAMIN B-12 1000 MCG PO TABS
1000.0000 ug | ORAL_TABLET | Freq: Every day | ORAL | Status: DC
  Administered 2016-09-24 – 2016-09-26 (×3): 1000 ug via ORAL
  Filled 2016-09-22 (×3): qty 1

## 2016-09-22 MED ORDER — TRAZODONE HCL 100 MG PO TABS
300.0000 mg | ORAL_TABLET | Freq: Every day | ORAL | Status: DC
  Administered 2016-09-22 – 2016-09-25 (×4): 300 mg via ORAL
  Filled 2016-09-22 (×4): qty 3

## 2016-09-22 MED ORDER — VALPROIC ACID 250 MG PO CAPS: 500.0000 mg | ORAL_CAPSULE | Freq: Every morning | ORAL | 1 refills | Status: DC

## 2016-09-22 MED ORDER — CYANOCOBALAMIN 1000 MCG PO TABS: 1000.0000 ug | ORAL_TABLET | Freq: Every day | ORAL | 1 refills | Status: DC

## 2016-09-22 NOTE — Progress Notes (Signed)
D: Patient is alert and oriented on the unit this shift. Patient attended and actively participated in groups today. Patient denies suicidal ideation, homicidal ideation, auditory or visual hallucinations at the present time.  A: Scheduled medications are administered to patient as per MD orders. Emotional support and encouragement are provided. Patient is maintained on q.15 minute safety checks. Patient is informed to notify staff with questions or concerns. R: No adverse medication reactions are noted. Patient is cooperative with medication administration and treatment plan today. Patient is receptive,  Anxious about returning home  and cooperative on the unit at this time. Patient interacts   with others on the unit this shift. Patient contracts for safety at this time. Patient remains safe at this time.

## 2016-09-22 NOTE — Tx Team (Signed)
Interdisciplinary Treatment and Diagnostic Plan Update  09/22/2016 Time of Session: 10:30am Jenna Riggs MRN: [id number]  Principal Diagnosis: Bipolar I disorder with anxious distress [facility])  Secondary Diagnoses: Principal Problem:   Bipolar I disorder with anxious distress (HCC) Active Problems:   Moderate intellectual disability   HTN (hypertension)   Impulse control disorder (skin picking)   Tobacco use disorder   UTI (urinary tract infection)   Vitamin B12 deficiency   Current Medications:  Current Facility-Administered Medications  Medication Dose Route Frequency Provider Last Rate Last Dose  . acetaminophen (TYLENOL) tablet 650 mg  650 mg Oral Q6H PRN Jolanta B Pucilowska, MD      . alum & mag hydroxide-simeth (MAALOX/MYLANTA) 200-200-20 MG/5ML suspension 30 mL  30 mL Oral Q4H PRN Jolanta B Pucilowska, MD      . cyanocobalamin ((VITAMIN B-12)) injection 1,000 mcg  1,000 mcg Intramuscular Daily Jolanta B Pucilowska, MD      . hydrochlorothiazide (HYDRODIURIL) tablet 25 mg  25 mg Oral Daily Jolanta B Pucilowska, MD   25 mg at 09/22/16 0857  . magnesium hydroxide (MILK OF MAGNESIA) suspension 30 mL  30 mL Oral Daily PRN Jolanta B Pucilowska, MD      . nicotine (NICODERM CQ - dosed in mg/24 hours) patch 21 mg  21 mg Transdermal Q0600 Jolanta B Pucilowska, MD      . nicotine polacrilex (NICORETTE) gum 2 mg  2 mg Oral PRN Darliss Ridgel, MD      . QUEtiapine (SEROQUEL) tablet 100 mg  100 mg Oral BID Shari Prows, MD   100 mg at 09/22/16 0857  . traZODone (DESYREL) tablet 300 mg  300 mg Oral QHS Jolanta B Pucilowska, MD      . valproic acid (DEPAKENE) 250 MG capsule 500 mg  500 mg Oral q morning - 10a Darliss Ridgel, MD   500 mg at 09/22/16 0857  . valproic acid (DEPAKENE) 250 MG capsule 750 mg  750 mg Oral QHS Darliss Ridgel, MD   750 mg at 09/21/16 2233  . [START ON 09/24/2016] vitamin B-12 (CYANOCOBALAMIN) tablet 1,000 mcg  1,000 mcg Oral Daily Jolanta B Pucilowska, MD        PTA Medications: Prescriptions Prior to Admission  Medication Sig Dispense Refill Last Dose  . lamoTRIgine (LAMICTAL) 150 MG tablet Take 1 tablet by mouth daily.     Marland Kitchen lisinopril (PRINIVIL,ZESTRIL) 20 MG tablet Take 1 tablet by mouth daily.     . QUEtiapine (SEROQUEL) 100 MG tablet Take 1 tablet by mouth 2 (two) times daily.     . traZODone (DESYREL) 100 MG tablet Take 200 mg by mouth at bedtime.   unknown at unknown    Patient Stressors: Marital or family conflict Medication change or noncompliance  Patient Strengths: Barrister's clerk for treatment/growth  Treatment Modalities: Medication Management, Group therapy, Case management,  1 to 1 session with clinician, Psychoeducation, Recreational therapy.   Physician Treatment Plan for Primary Diagnosis: Bipolar I disorder with anxious distress (HCC) Long Term Goal(s): Improvement in symptoms so as ready for discharge Improvement in symptoms so as ready for discharge   Short Term Goals: Ability to verbalize feelings will improve Ability to demonstrate self-control will improve Ability to identify and develop effective coping behaviors will improve Compliance with prescribed medications will improve Ability to demonstrate self-control will improve Ability to identify and develop effective coping behaviors will improve Compliance with prescribed medications will improve  Medication Management: Evaluate patient's response, side effects, and  tolerance of medication regimen.  Therapeutic Interventions: 1 to 1 sessions, Unit Group sessions and Medication administration.  Evaluation of Outcomes: Progressing  Physician Treatment Plan for Secondary Diagnosis: Principal Problem:   Bipolar I disorder with anxious distress (HCC) Active Problems:   Moderate intellectual disability   HTN (hypertension)   Impulse control disorder (skin picking)   Tobacco use disorder   UTI (urinary tract infection)   Vitamin B12  deficiency  Long Term Goal(s): Improvement in symptoms so as ready for discharge Improvement in symptoms so as ready for discharge   Short Term Goals: Ability to verbalize feelings will improve Ability to demonstrate self-control will improve Ability to identify and develop effective coping behaviors will improve Compliance with prescribed medications will improve Ability to demonstrate self-control will improve Ability to identify and develop effective coping behaviors will improve Compliance with prescribed medications will improve     Medication Management: Evaluate patient's response, side effects, and tolerance of medication regimen.  Therapeutic Interventions: 1 to 1 sessions, Unit Group sessions and Medication administration.  Evaluation of Outcomes: Progressing   RN Treatment Plan for Primary Diagnosis: Bipolar I disorder with anxious distress (HCC) Long Term Goal(s): Knowledge of disease and therapeutic regimen to maintain health will improve  Short Term Goals: Ability to verbalize frustration and anger appropriately will improve, Ability to disclose and discuss suicidal ideas, Ability to identify and develop effective coping behaviors will improve and Compliance with prescribed medications will improve  Medication Management: RN will administer medications as ordered by provider, will assess and evaluate patient's response and provide education to patient for prescribed medication. RN will report any adverse and/or side effects to prescribing provider.  Therapeutic Interventions: 1 on 1 counseling sessions, Psychoeducation, Medication administration, Evaluate responses to treatment, Monitor vital signs and CBGs as ordered, Perform/monitor CIWA, COWS, AIMS and Fall Risk screenings as ordered, Perform wound care treatments as ordered.  Evaluation of Outcomes: Progressing   LCSW Treatment Plan for Primary Diagnosis: Bipolar I disorder with anxious distress (HCC) Long Term  Goal(s): Safe transition to appropriate next level of care at discharge, Engage patient in therapeutic group addressing interpersonal concerns.  Short Term Goals: Engage patient in aftercare planning with referrals and resources, Increase social support, Increase ability to appropriately verbalize feelings, Increase emotional regulation, Facilitate acceptance of mental health diagnosis and concerns and Increase skills for wellness and recovery  Therapeutic Interventions: Assess for all discharge needs, 1 to 1 time with Social worker, Explore available resources and support systems, Assess for adequacy in community support network, Educate family and significant other(s) on suicide prevention, Complete Psychosocial Assessment, Interpersonal group therapy.  Evaluation of Outcomes: Progressing   Progress in Treatment: Attending groups: Yes. Participating in groups: Yes. Taking medication as prescribed: Yes. Toleration medication: Yes. Family/Significant other contact made: Yes, individual(s) contacted:  guardian Patient understands diagnosis: Yes. Discussing patient identified problems/goals with staff: Yes. Medical problems stabilized or resolved: Yes. Denies suicidal/homicidal ideation: Yes. Issues/concerns per patient self-inventory: No. Other: n/a  New problem(s) identified: None identified at this time.   New Short Term/Long Term Goal(s): None identified at this time.   Discharge Plan or Barriers: Patient will discharge home with her family and follow-up with [facility] in [place].   Reason for Continuation of Hospitalization: Anxiety Medication stabilization  Estimated Length of Stay:1 to 3 days.   Attendees: Patient: Jenna Riggs 09/22/2016 1:26 PM  Physician: Dr. Kristine Linea, MD 09/22/2016 1:26 PM  Nursing: Hulan Amato, RN 09/22/2016 1:26 PM  RN Care  Manager: 09/22/2016 1:26 PM  Social Worker: Clementeen HoofGregory J. Troy SineWierda, LCSW 09/22/2016 1:26 PM   Recreational Therapist: Jacquelynn CreeElizabeth M. Greene, LRT/CTRS 09/22/2016 1:26 PM  Other:  09/22/2016 1:26 PM  Other:  09/22/2016 1:26 PM  Other: 09/22/2016 1:26 PM    Scribe for Treatment Team: Arelia LongestAmaris G Sariya Trickey, LCSWA 09/22/2016 1:26 PM

## 2016-09-22 NOTE — Progress Notes (Signed)
Patient calm and cooperative. Bright affect. Patient is social with peers. Pt does become angry when speaking of discharge. Pt wants to to be placed in a group home. Pt reports living with 12 adults 5 kids and 5 dogs.  Verbalizes needs appropriately with Clinical research associatewriter. No SI/HI at this time.  Encouragement and support offered. Safety checks  Maintained. Pt receptive and remains safe on unit with q 15 min checks.

## 2016-09-22 NOTE — Plan of Care (Signed)
Problem: Medication: Goal: Compliance with prescribed medication regimen will improve Outcome: Progressing Patient medication compliant   

## 2016-09-22 NOTE — Progress Notes (Signed)
D: Pt denies SI/HI/AVH. Pt is pleasant and cooperative. Pt is very upset about going home with her sister. Pt really wants to go to the group home.   A: Pt was offered support and encouragement. Pt was given scheduled medications. Pt was encourage to attend groups. Q 15 minute checks were done for safety.   R:Pt attends groups and interacts well with peers and staff. Pt is taking medication. Pt receptive to treatment and safety maintained on unit.

## 2016-09-22 NOTE — Progress Notes (Signed)
Recreation Therapy Notes  Date: 01.25.18 Time: 9:30 am Location: Craft Room  Group Topic: Leisure Education  Goal Area(s) Addresses:  Patient will identify things they are grateful for. Patient will identify how being grateful can influenced decision making.  Behavioral Response: Attentive, Interactive  Intervention: Grateful Wheel  Activity: Patients were given an I Am Grateful For worksheet and were instructed to write things they are grateful for under each category.  Education: LRT educated patients on leisure.  Education Outcome: In group clarification offered  Clinical Observations/Feedback: Patient did not participate in group activity. Patient contributed to group discussion by stating things she was grateful for. Patient was focused on a meeting she wanted to have with her sister and that her sister got a goat.  Jacquelynn CreeGreene,Zian Delair M, LRT/CTRS 09/22/2016 10:09 AM
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
## 2016-09-22 NOTE — Plan of Care (Signed)
Problem: Coping: Goal: Ability to cope will improve Outcome: Progressing Patient not  able to cope with emotions at this time [facility]

## 2016-09-22 NOTE — Plan of Care (Signed)
Problem: Medication: Goal: Compliance with prescribed medication regimen will improve Outcome: Progressing Pt took  [facility] refused B12 shot and nicotine patch but took everything else

## 2016-09-22 NOTE — [facility] Group Notes (Signed)
Goals Group Date/Time: 09/22/2016 9:00 AM Type of Therapy and Topic: Group Therapy: Goals Group: SMART Goals   Participation Level: Minimal  Description of Group:    The purpose of a daily goals group is to assist and guide patients in setting recovery/wellness-related goals. The objective is to set goals as they relate to the crisis in which they were admitted. Patients will be using SMART goal modalities to set measurable goals. Characteristics of realistic goals will be discussed and patients will be assisted in setting and processing how one will reach their goal. Facilitator will also assist patients in applying interventions and coping skills learned in psycho-education groups to the SMART goal and process how one will achieve defined goal.   Therapeutic Goals:   -Patients will develop and document one goal related to or their crisis in which brought them into treatment.  -Patients will be guided by LCSW using SMART goal setting modality in how to set a measurable, attainable, realistic and time sensitive goal.  -Patients will process barriers in reaching goal.  -Patients will process interventions in how to overcome and successful in reaching goal.   Patient's Goal: Pt stated her goal was to "stay another day" since she is unhappy about going to her guardian's home upon her discharge.   Therapeutic Modalities:  Motivational Interviewing  Research officer, political partyCognitive Behavioral Therapy  Crisis Intervention Model  SMART goals setting   Daleen SquibbGreg Lilianna Case, [place]

## 2016-09-22 NOTE — [facility] Group Notes (Signed)
[facility]  Date/Time: 09/22/16, 1300.  Type of Therapy/Topic:  Group Therapy:  Balance in Life  Participation Level:  minimal  Description of Group:    This group will address the concept of balance and how it feels and looks when one is unbalanced. Patients will be encouraged to process areas in their lives that are out of balance, and identify reasons for remaining unbalanced. Facilitators will guide patients utilizing problem- solving interventions to address and correct the stressor making their life unbalanced. Understanding and applying boundaries will be explored and addressed for obtaining  and maintaining a balanced life. Patients will be encouraged to explore ways to assertively make their unbalanced needs known to significant others in their lives, using other group members and facilitator for support and feedback.  Therapeutic Goals: 1. Patient will identify two or more emotions or situations they have that consume much of in their lives. 2. Patient will identify signs/triggers that life has become out of balance:  3. Patient will identify two ways to set boundaries in order to achieve balance in their lives:  4. Patient will demonstrate ability to communicate their needs through discussion and/or role plays  Summary of Patient Progress: Pt unable to focus on group topic and continues to want to discuss her personal situation.  Pt redirected by CSW, to discuss these issues after group.       Therapeutic Modalities:   Cognitive Behavioral Therapy Solution-Focused Therapy Assertiveness Training  Daleen SquibbGreg Jashley Yellin, [place]CS[place]

## 2016-09-22 NOTE — [facility] Group Notes (Signed)
[facility] Group Notes:  (Nursing/MHT/Case Management/Adjunct)  Date:  09/22/2016  Time:  6:07 PM  Type of Therapy:  Psychoeducational Skills  Participation Level:  Did Not Attend  Jenna Riggs 09/22/2016, 6:07 PM 
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
## 2016-09-23 NOTE — [place] FL2 (Signed)
[place] MEDICAID FL2 LEVEL OF CARE SCREENING TOOL     IDENTIFICATION  Patient Name: Jenna Riggs Birthdate: [birth year] Sex: female Admission Date (Current Location): 09/17/2016  [place] and [place] Number:  Jenna Riggs [id number] [facility] Facility and Address:  [facility], [address], [place], [place] [address]      Provider Number: [id number]  Attending Physician Name and Address:  Shari Prows, MD  Relative Name and Phone Number:  Jenna Riggs- guardian [phone]    Current Level of Care: Hospital Recommended Level of Care: Other (Comment) (Group Home) Prior Approval Number:    Date Approved/Denied:   PASRR Number:    Discharge Plan: Other (Comment) (Group Home)    Current Diagnoses: Patient Active Problem List   Diagnosis Date Noted  . Vitamin B12 deficiency 09/20/2016  . Tobacco use disorder 09/17/2016  . UTI (urinary tract infection) 09/17/2016  . 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 HTN (hypertension) 02/13/2015  . 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 Impulse control disorder (skin picking) 02/13/2015  . Bipolar I disorder with anxious distress (HCC) 02/12/2015    Orientation RESPIRATION BLADDER Height & Weight     Self, Time, Situation, Place  Normal Continent Weight: 210 lb (95.3 kg) Height:  5\' 3"  (160 cm)  BEHAVIORAL SYMPTOMS/MOOD NEUROLOGICAL BOWEL NUTRITION STATUS  Other (Comment) (Patient has hx of medication noncompliance)  (n/a) Continent  (n/a)  AMBULATORY STATUS COMMUNICATION OF NEEDS Skin   Independent Verbally Normal                       Personal Care Assistance Level of Assistance   (n/a; Independent)           Functional Limitations Info   (n/a)          SPECIAL CARE FACTORS FREQUENCY   (n/a)                    Contractures Contractures Info: Not present    Additional Factors Info   (n/a)               Current Medications (09/23/2016):  This is the  current hospital active medication list Current Facility-Administered Medications  Medication Dose Route Frequency Provider Last Rate Last Dose  . acetaminophen (TYLENOL) tablet 650 mg  650 mg Oral Q6H PRN Jolanta B Pucilowska, MD      . alum & mag hydroxide-simeth (MAALOX/MYLANTA) 200-200-20 MG/5ML suspension 30 mL  30 mL Oral Q4H PRN Jolanta B Pucilowska, MD      . cyanocobalamin ((VITAMIN B-12)) injection 1,000 mcg  1,000 mcg Intramuscular Daily Jolanta B Pucilowska, MD      . hydrochlorothiazide (HYDRODIURIL) tablet 25 mg  25 mg Oral Daily Jolanta B Pucilowska, MD   25 mg at 09/23/16 0858  . magnesium hydroxide (MILK OF MAGNESIA) suspension 30 mL  30 mL Oral Daily PRN Jolanta B Pucilowska, MD      . nicotine (NICODERM CQ - dosed in mg/24 hours) patch 21 mg  21 mg Transdermal Q0600 Jolanta B Pucilowska, MD      . nicotine polacrilex (NICORETTE) gum 2 mg  2 mg Oral PRN Darliss Ridgel, MD      . QUEtiapine (SEROQUEL) tablet 100 mg  100 mg Oral BID Shari Prows, MD   100 mg at 09/23/16 0859  . traZODone (DESYREL) tablet 300 mg  300 mg Oral QHS Jolanta B Pucilowska, MD   300 mg at 09/22/16 2250  . valproic  acid (DEPAKENE) 250 MG capsule 500 mg  500 mg Oral q morning - 10a Darliss RidgelAarti K Kapur, MD   500 mg at 09/23/16 0858  . valproic acid (DEPAKENE) 250 MG capsule 750 mg  750 mg Oral QHS Darliss RidgelAarti K Kapur, MD   750 mg at 09/22/16 2250  . [START ON 09/24/2016] vitamin B-12 (CYANOCOBALAMIN) tablet 1,000 mcg  1,000 mcg Oral Daily Shari ProwsJolanta B Pucilowska, MD         Discharge Medications: Please see discharge summary for a list of discharge medications.  Relevant Imaging Results:  Relevant Lab Results:   Additional Information n/a  Primus Bravomaris G Shadi Sessler, LCSWA  09/23/2016 2:41 PM

## 2016-09-23 NOTE — [facility] Group Notes (Signed)
[facility] LCSW Group Therapy Note  Date/Time: 09/23/16 1300  Type of Therapy and Topic:  Group Therapy:  Feelings around Relapse and Recovery  Participation Level:  Did Not Attend (remains to same)  Mood:  Description of Group:    Patients in this group will discuss emotions they experience before and after a relapse. They will process how experiencing these feelings, or avoidance of experiencing them, relates to having a relapse. Facilitator will guide patients to explore emotions they have related to recovery. Patients will be encouraged to process which emotions are more powerful. They will be guided to discuss the emotional reaction significant others in their lives may have to patients' relapse or recovery. Patients will be assisted in exploring ways to respond to the emotions of others without this contributing to a relapse.  Therapeutic Goals: 1. Patient will identify two or more emotions that lead to relapse for them:  2. Patient will identify two emotions that result when they relapse:  3. Patient will identify two emotions related to recovery:  4. Patient will demonstrate ability to communicate their needs through discussion and/or role plays.   Summary of Patient Progress:        Therapeutic Modalities:   Cognitive Behavioral Therapy Solution-Focused Therapy Assertiveness Training Relapse Prevention Therapy    Greg Gurnoor Sloop, LCSW   

## 2016-09-23 NOTE — [facility] Suicide Risk Assessment (Signed)
[facility]   Principal Problem: Bipolar I disorder with anxious distress [facility](HCC) Discharge Diagnoses:  Patient Active Problem List   Diagnosis Date Noted  . Bipolar I disorder with anxious distress (HCC) [F31.9] 02/12/2015    Priority: High  . Vitamin B12 deficiency [E53.8] 09/20/2016  . Tobacco use disorder [F17.200] 09/17/2016  . UTI (urinary tract infection) [N39.0] 09/17/2016  . 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 HTN (hypertension) [I10] 02/13/2015  . 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   Blood pressure 121/76, pulse 71, temperature 97.8 F (36.6 C), temperature source Oral, resp. rate 20, height 5\' 3"  (1.6 m), weight 95.3 kg (210 lb), SpO2 99 %.Body mass index is 37.2 kg/m.  General Appearance: Casual  Eye Contact::  Good  Speech:  Clear and Coherent409  Volume:  Normal  Mood:  Anxious  Affect:  Appropriate  Thought Process:  Goal Directed and Descriptions of Associations: Intact  Orientation:  Full (Time, Place, and Person)  Thought Content:  WDL  Suicidal Thoughts:  No  Homicidal Thoughts:  No  Memory:  Immediate;   Fair Recent;   Fair Remote;   Fair  Judgement:  Poor  Insight:  Lacking  Psychomotor Activity:  Normal  Concentration:  Fair  Recall:  [employer]  Fund of Knowledge:Fair  Language: Fair  Akathisia:  No  Handed:  Right  AIMS (if indicated):     Assets:  Communication Skills Desire for Improvement Financial Resources/Insurance Housing Physical Health Resilience Social Support  Sleep:  Number of Hours: 6.15  Cognition: WNL  ADL's:  Intact   Mental Status Per Nursing Assessment::    On Admission:     Demographic Factors:  Caucasian  Loss Factors: NA  Historical Factors: Prior suicide attempts, Family history of mental illness or substance abuse and Impulsivity  Risk Reduction Factors:   Sense of responsibility to family, Living with another person, especially a relative, Positive social support and Positive therapeutic relationship  Continued Clinical Symptoms:  Bipolar Disorder:   Depressive phase Depression:   Impulsivity Previous Psychiatric Diagnoses and Treatments  Cognitive Features That Contribute To Risk:  None    Suicide Risk:  Minimal: No identifiable suicidal ideation.  Patients presenting with no risk factors but with morbid ruminations; may be classified as minimal risk based on the severity of the depressive symptoms  Follow-up Information    [facility]. Go on 09/27/2016.   Why:  Follow-up appointment on this date at 1:20pm with Dr. Marvis MoellerKathleen Fitz for outpatient services. Bring insurance card, current medications, and discharge summary with you to this appointment.  Contact information: [address] [place] [address] [phone]           Plan Of Care/Follow-up recommendations:  Activity:  s tolerated. Diet:  low sodium heart healthy. Other:  keep follow up appointments.  Kristine LineaJolanta Pucilowska, MD 09/23/2016, 8:17 AM
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
## 2016-09-23 NOTE — Progress Notes (Signed)
Recreation Therapy Notes  Date: 01.26.18 Time: 9:30 am Location: Craft Room  Group Topic: Coping Skills  Goal Area(s) Addresses:  Patient will participate in healthy coping skill. Patient will verbalize benefit of using art as a coping skill.  Behavioral Response: Attentive, Interactive  Intervention: Coloring  Activity: Patients were given coloring sheets and were instructed to think about the emotions they were feeling and what their minds were focused on.  Education: LRT educated patients on healthy coping skills.  Education Outcome: In group clarification offered  Clinical Observations/Feedback: Patient colored coloring sheet. Patient contributed to group discussion by stating what makes art a good coping skill.  Jacquelynn CreeGreene,Camilo Mander M, LRT/CTRS 09/23/2016 10:28 AM

## 2016-09-23 NOTE — [facility] Group Notes (Signed)
[facility] Group Notes:  (Nursing/MHT/Case Management/Adjunct)  Date:  09/23/2016  Time:  5:50 AM  Type of Therapy:  Psychoeducational Skills  Participation Level:  Active  Participation Quality:  Appropriate  Affect:  Appropriate  Cognitive:  Appropriate  Insight:  Appropriate  Engagement in Group:  Engaged  Modes of Intervention:  Discussion, Socialization and Support  Summary of Progress/Problems:  Jenna MilroyLaquanda Y Samanvi Riggs 09/23/2016, 5:50 AM

## 2016-09-23 NOTE — Progress Notes (Signed)
Denies SI/HI/AVH.  Irritable first am because did not want to go home wanted to go to a group home.  Family meeting today and after meeting patient was excited stating "My guardian agreed to let me go to a group home.  Support and encouragement offered.  Safety maintained.
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
## 2016-09-24 NOTE — Progress Notes (Signed)
Pt denies SI/HI/AVH. Denies pain. Visible in milieu with appropriate interaction among staff and peers. Medication compliant. Denies pain. Voices no additional concerns at this time. Safety maintained. Will continue to monitor 

## 2016-09-24 NOTE — Progress Notes (Signed)
Pleasant and cooperative.  Denies SI/HI/AVH.  States that she is tired today.  Visible in the milieu.  Interacting appropriately with peers and staff.  Support and encouragement offered.  Safety maintained.

## 2016-09-24 NOTE — Progress Notes (Signed)
Patient ID: Jenna AblesLinda Riggs, female   DOB: [birth year], 56 y.o.   MRN: [id number]  CSW spoke with group home manager, [name] at [facility]. Stated she would be here on Monday to pick up patient. Stated the pharmacy they partner with at the group home would not be able to fill patient's medications until Monday.   [name] MSW, [facility] 09/24/2016 3:41 PM

## 2016-09-24 NOTE — [facility] Group Notes (Signed)
[facility] Group Notes:  (Nursing/MHT/Case Management/Adjunct)  Date:  09/24/2016  Time:  6:02 AM  Type of Therapy:  Psychoeducational Skills  Participation Level:  Active  Participation Quality:  Appropriate  Affect:  Appropriate  Cognitive:  Appropriate  Insight:  Appropriate and Good  Engagement in Group:  Engaged  Modes of Intervention:  Discussion, Socialization and Support  Summary of Progress/Problems:  Jenna MilroyLaquanda Y Evalisse Riggs 09/24/2016, 6:02 AM
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
## 2016-09-24 NOTE — [facility] Group Notes (Signed)
[facility] LCSW Group Therapy  09/24/2016 2:15 PM (Note Entry for Claudine M. Bandi, LCSW)  Type of Therapy:  Group Therapy  Participation Level:  Patient did not attend group. CSW invited patient to group.   Summary of Progress/Problems: Balance in life: Patients will discuss the concept of balance and how it looks and feels to be unbalanced. Pt will identify areas in their life that is unbalanced and ways to become more balanced. They discussed what aspects in their lives has influenced their self care. Patients also discussed self care in the areas of self regulation/control, hygiene/appearance, sleep/relaxation, healthy leisure, healthy eating habits, exercise, inner peace/spirituality, self improvement, sobriety, and health management. They were challenged to identify changes that are needed in order to improve self care.  Scribe for Group Therapy Note Louvina Cleary G. Garnette CzechSampson MSW, LCSWA 09/24/2016, 2:15 PM

## 2016-09-25 NOTE — [facility] Group Notes (Signed)
[facility] LCSW Group Therapy  1/28/20181:00 PM(Note Entry for Jenna Merriweather M. Shabana Armentrout, LCSW)  Type of Therapy: Group Therapy  Participation Level: Patient did not attend group. CSW invited patient to group.   Summary of Progress/Problems:The topic for group today was emotional regulation. This group focused on both positive and negative emotion identification and allowed group members to process ways to identify feelings, regulate negative emotions, and find healthy ways to manage internal/external emotions. Group members were asked to reflect on a time when their reaction to an emotion led to a negative outcome and explored how alternative responses using emotion regulation would have benefited them. Group members were also asked to discuss a time when emotion regulation was utilized when a negative emotion was experienced.   Danesha Kirchoff LCSW  
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
## 2016-09-25 NOTE — [facility] Group Notes (Signed)
[facility] Group Notes:  (Nursing/MHT/Case Management/Adjunct)  Date:  09/25/2016  Time:  12:26 AM  Type of Therapy:  Evening Wrap-up Group  Participation Level:  Active  Participation Quality:  Appropriate and Attentive  Affect:  Appropriate  Cognitive:  Alert and Appropriate  Insight:  Improving  Engagement in Group:  Developing/Improving  Modes of Intervention:  Discussion  Summary of Progress/Problems:  Tomasita MorrowChelsea Nanta Miarose Lippert 09/25/2016, 12:26 AM

## 2016-09-25 NOTE — Plan of Care (Signed)
Problem: Activity: Goal: Interest or engagement in activities will improve Outcome: Progressing Pt visible in milieu socializing with peers. Attends evening groups.

## 2016-09-25 NOTE — Progress Notes (Signed)
Pt was cooperative with treatment on shift thus far. She denies SI/HI/AVH and denies pain. Visible in milieu with appropriate interaction among staff and peers. Medication compliant. She voiced no additional concerns thus far.  Safety measures maintained. Will continue to monitor and ensure safety measures.

## 2016-09-25 NOTE — Progress Notes (Signed)
Pt denies SI/HI/AVH. Denies pain. Visible in milieu with appropriate interaction among staff and peers. Medication compliant. Denies pain. Voices no additional concerns at this time. Safety maintained. Will continue to monitor 

## 2016-09-25 NOTE — Progress Notes (Signed)
Denies SI/HI/AVH.  Affect bright.  Visible in the milieu.  Interacting with peers and staff appropriately.  Verbalizes that she is excited to be going to a group home tomorrow.  Did not attend group today.  Support and encouragement offered.  Safety maintained.

## 2016-09-26 ENCOUNTER — Other Ambulatory Visit: Payer: Self-pay | Admitting: Psychiatry

## 2016-09-26 NOTE — Progress Notes (Signed)
  [facility] :  Will you be returning to the same living situation after discharge:  Yes,  with guardian At discharge, do you have transportation home?: Yes,  guardian Do you have the ability to pay for your medications: Yes,  medicare/medicaid  Release of information consent forms completed and in the chart;  Patient's signature needed at discharge.  Patient to Follow up at: Follow-up Information    [facility]. Go on 09/27/2016.   Why:  Follow-up appointment on this date at 1:20pm with Dr. Marvis MoellerKathleen Fitz for outpatient services. Bring insurance card, current medications, and discharge summary with you to this appointment.  Contact information: [address] [place] [address] [phone]           Next level of care provider [facility] Link:no  Safety Planning and Suicide Prevention discussed: Yes,  with guardian     Has patient been referred to the Quitline?: Patient refused referral  Patient has been referred for addiction treatment: N/A  Lorri FrederickWierda, Mallory Schaad Jon, LCSW 09/26/2016, 3:06 PM

## 2016-09-26 NOTE — [facility] Suicide Risk Assessment (Signed)
[facility]   Principal Problem: Bipolar I disorder with anxious distress [facility](HCC) Discharge Diagnoses:  Patient Active Problem List   Diagnosis Date Noted  . Bipolar I disorder with anxious distress (HCC) [F31.9] 02/12/2015    Priority: High  . Vitamin B12 deficiency [E53.8] 09/20/2016  . Tobacco use disorder [F17.200] 09/17/2016  . UTI (urinary tract infection) [N39.0] 09/17/2016  . 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 HTN (hypertension) [I10] 02/13/2015  . 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   Blood pressure 118/62, pulse 77, temperature 97.9 F (36.6 C), resp. rate 18, height 5\' 3"  (1.6 m), weight 95.3 kg (210 lb), SpO2 99 %.Body mass index is 37.2 kg/m.  General Appearance: Casual  Eye Contact::  Good  Speech:  Clear and Coherent409  Volume:  Normal  Mood:  Anxious  Affect:  Appropriate  Thought Process:  Goal Directed and Descriptions of Associations: Intact  Orientation:  Full (Time, Place, and Person)  Thought Content:  WDL  Suicidal Thoughts:  No  Homicidal Thoughts:  No  Memory:  Immediate;   Fair Recent;   Fair Remote;   Fair  Judgement:  Poor  Insight:  Lacking  Psychomotor Activity:  Normal  Concentration:  Fair  Recall:  [employer]Fair  Fund of Knowledge:Fair  Language: Fair  Akathisia:  No  Handed:  Right  AIMS (if indicated):     Assets:  Communication Skills Desire for Improvement Financial Resources/Insurance Housing Physical Health Resilience Social Support  Sleep:  Number of Hours: 6  Cognition: WNL  ADL's:  Intact   Mental Status Per Nursing Assessment::   On Admission:      Demographic Factors:  Caucasian  Loss Factors: NA  Historical Factors: Prior suicide attempts, Family history of mental illness or substance abuse and Impulsivity  Risk Reduction Factors:   Sense of responsibility to family, Living with another person, especially a relative, Positive social support and Positive therapeutic relationship  Continued Clinical Symptoms:  Bipolar Disorder:   Depressive phase Depression:   Impulsivity Previous Psychiatric Diagnoses and Treatments  Cognitive Features That Contribute To Risk:  None    Suicide Risk:  Minimal: No identifiable suicidal ideation.  Patients presenting with no risk factors but with morbid ruminations; may be classified as minimal risk based on the severity of the depressive symptoms  Follow-up Information    [facility]. Go on 09/27/2016.   Why:  Follow-up appointment on this date at 1:20pm with Dr. Marvis MoellerKathleen Fitz for outpatient services. Bring insurance card, current medications, and discharge summary with you to this appointment.  Contact information: [address] [place] [address] [phone]           Plan Of Care/Follow-up recommendations:  Activity:  s tolerated. Diet:  low sodium heart healthy. Other:  keep follow up appointments.  Kristine LineaJolanta Mylinh Cragg, MD 09/26/2016, 10:34 AM

## 2016-09-26 NOTE — Progress Notes (Signed)
Recreation Therapy Notes  Date: 01.29.18 Time: 9:30 am Location: Craft Room  Group Topic: Self-expression  Goal Area(s) Addresses:  Patient will effectively use art as a means of self-expression. Patient will recognize positive benefit of self-expression. Patient will be able to identify one emotion experienced during group session. Patient will identify use of art as a coping skill.  Behavioral Response: Attentive, Interactive  Intervention: Two Faces of Me  Activity: Patients were given a blank face worksheet and were instructed to draw a line down the middle. On one side of the worksheet, patients were instructed to draw or write how they felt when they were admitted. On the other side, patients were instructed to draw or write how they want to feel when they are d/c.   Education: LRT educated patients on different forms of self-expression.   Education Outcome: In group clarification offered  Clinical Observations/Feedback: Patient drew how she felt when she was admitted and how she wants to feel when she is d/c. Patient contributed to group discussion by stating how her faces were different, what she has learned in the hospital, and what emotions she felt during group.  Jacquelynn CreeGreene,Shonteria Abeln M, LRT/CTRS 09/26/2016 10:03 AM
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
## 2016-09-26 NOTE — Progress Notes (Signed)
09/26/16 [facility] was supposed to pick pt up on Saturday, 09/24/16.  [facility] reported they did not do so because they needed medication to get them through the weekend.  She agreed to pick pt up at 3pm today, 09/26/16.  Pt's legal guardian, Lupita LeashDonna, wanted to speak with the group home, who had agreed to contact her last Friday but never did.  CSW gave Lupita LeashDonna the group home phone number.  Lupita LeashDonna called back shortly and said she was concerned about the group home location.  Lupita LeashDonna also informed the group home that she would be unable to sign over pt check for February to pay for her care. Jan called back with concerns at this point that this would be an ongoing issue and ultimately decided to not accept pt.  CSW spoke with Dr Demetrius CharityP who decided that at this point, pt would be discharged to the care of her legal guardian.  CSW spoke to Lupita LeashDonna again and informed her that we would call her when pt was ready for them to pick her up. Daleen SquibbGreg Ellanora Rayborn, LCSW

## 2016-09-26 NOTE — [facility] Group Notes (Signed)
[facility] Group Notes:  (Nursing/MHT/Case Management/Adjunct)  Date:  09/26/2016  Time:  3:45 PM  Type of Therapy:  Psychoeducational Skills  Participation Level:  Minimal  Participation Quality:  Appropriate and Minimal  Affect:  Appropriate  Cognitive:  Alert and Appropriate  Insight:  Limited  Engagement in Group:  Poor  Modes of Intervention:  Discussion, Education and Support  Summary of Progress/Problems:  Lynelle SmokeCara Travis [name] 09/26/2016, 3:45 PM

## 2016-09-26 NOTE — Progress Notes (Signed)
Patient discharged home. DC instructions provided and explained. Medications reviewed. Rx given. All questions answered. Pt stable at discharge denies SI, HI, AVH. Belongings returned. Transition and discharge summary given to patient.

## 2016-09-26 NOTE — [facility] Group Notes (Signed)
[facility] LCSW Group Therapy Note  Date/Time:09/26/2206  Type of Therapy and Topic:  Group Therapy:  Overcoming Obstacles  Participation Level:    Description of Group:    In this group patients will be encouraged to explore what they see as obstacles to their own wellness and recovery. They will be guided to discuss their thoughts, feelings, and behaviors related to these obstacles. The group will process together ways to cope with barriers, with attention given to specific choices patients can make. Each patient will be challenged to identify changes they are motivated to make in order to overcome their obstacles. This group will be process-oriented, with patients participating in exploration of their own experiences as well as giving and receiving support and challenge from other group members.  Therapeutic Goals: 1. Patient will identify personal and current obstacles as they relate to admission. 2. Patient will identify barriers that currently interfere with their wellness or overcoming obstacles.  3. Patient will identify feelings, thought process and behaviors related to these barriers. 4. Patient will identify two changes they are willing to make to overcome these obstacles:    Summary of Patient Progress   Pt able to achieve therapeutic goals. She requires some redirection from interrupting others, but otherwise appropriate.  She     Therapeutic Modalities:   Cognitive Behavioral Therapy Solution Focused Therapy Motivational Interviewing Relapse Prevention Therapy  Jake SharkSara Rayelle Armor, MSW, LCSW

## 2016-09-26 NOTE — [facility] Group Notes (Signed)
[facility] Group Notes:  (Nursing/MHT/Case Management/Adjunct)  Date:  09/26/2016  Time:  4:01 AM  Type of Therapy:  Group Therapy  Participation Level:  Active  Participation Quality:  Appropriate  Affect:  Excited  Cognitive:  Appropriate  Insight:  Appropriate  Engagement in Group:  Engaged  Modes of Intervention:  n/a  Summary of Progress/Problems: Pt was in a very good mood. Stated that her goal was leave here so she can go to her group home. Pt stated shes looking forward to living in the group home and hopes to have her own room.   Fanny Skatesshley Imani Raquel Sayres 09/26/2016, 4:01 AM

## 2016-11-22 ENCOUNTER — Other Ambulatory Visit: Payer: Self-pay | Admitting: Psychiatry

## 2016-12-01 ENCOUNTER — Other Ambulatory Visit: Payer: Self-pay | Admitting: Nurse Practitioner

## 2016-12-01 DIAGNOSIS — Z1231 Encounter for screening mammogram for malignant neoplasm of breast: Secondary | ICD-10-CM

## 2016-12-08 ENCOUNTER — Other Ambulatory Visit: Payer: Self-pay | Admitting: Psychiatry

## 2017-01-10 ENCOUNTER — Other Ambulatory Visit: Payer: Self-pay | Admitting: Psychiatry

## 2017-09-25 ENCOUNTER — Emergency Department: Payer: Medicare Other

## 2017-09-25 ENCOUNTER — Other Ambulatory Visit: Payer: Self-pay

## 2017-09-25 ENCOUNTER — Encounter: Payer: Self-pay | Admitting: Medical Oncology

## 2017-09-25 ENCOUNTER — Inpatient Hospital Stay
Admission: EM | Admit: 2017-09-25 | Discharge: 2017-09-29 | DRG: 683 | Disposition: A | Discharge status: Home Health | Payer: Medicare Other | Attending: Internal Medicine | Admitting: Internal Medicine

## 2017-09-25 DIAGNOSIS — F319 Bipolar disorder, unspecified: Secondary | ICD-10-CM | POA: Diagnosis present

## 2017-09-25 DIAGNOSIS — D6959 Other secondary thrombocytopenia: Secondary | ICD-10-CM | POA: Diagnosis present

## 2017-09-25 DIAGNOSIS — T426X5A Adverse effect of other antiepileptic and sedative-hypnotic drugs, initial encounter: Secondary | ICD-10-CM | POA: Diagnosis present

## 2017-09-25 DIAGNOSIS — Z7982 Long term (current) use of aspirin: Secondary | ICD-10-CM

## 2017-09-25 DIAGNOSIS — I129 Hypertensive chronic kidney disease with stage 1 through stage 4 chronic kidney disease, or unspecified chronic kidney disease: Secondary | ICD-10-CM | POA: Diagnosis present

## 2017-09-25 DIAGNOSIS — Y92009 Unspecified place in unspecified non-institutional (private) residence as the place of occurrence of the external cause: Secondary | ICD-10-CM

## 2017-09-25 DIAGNOSIS — F79 Unspecified intellectual disabilities: Secondary | ICD-10-CM | POA: Diagnosis present

## 2017-09-25 DIAGNOSIS — F17210 Nicotine dependence, cigarettes, uncomplicated: Secondary | ICD-10-CM | POA: Diagnosis present

## 2017-09-25 DIAGNOSIS — E876 Hypokalemia: Secondary | ICD-10-CM | POA: Diagnosis present

## 2017-09-25 DIAGNOSIS — N179 Acute kidney failure, unspecified: Secondary | ICD-10-CM | POA: Diagnosis not present

## 2017-09-25 DIAGNOSIS — E860 Dehydration: Secondary | ICD-10-CM | POA: Diagnosis not present

## 2017-09-25 DIAGNOSIS — Z9181 History of falling: Secondary | ICD-10-CM

## 2017-09-25 DIAGNOSIS — R531 Weakness: Secondary | ICD-10-CM

## 2017-09-25 DIAGNOSIS — N289 Disorder of kidney and ureter, unspecified: Secondary | ICD-10-CM

## 2017-09-25 DIAGNOSIS — N2581 Secondary hyperparathyroidism of renal origin: Secondary | ICD-10-CM | POA: Diagnosis present

## 2017-09-25 DIAGNOSIS — R296 Repeated falls: Secondary | ICD-10-CM | POA: Diagnosis present

## 2017-09-25 DIAGNOSIS — Q613 Polycystic kidney, unspecified: Secondary | ICD-10-CM

## 2017-09-25 DIAGNOSIS — R251 Tremor, unspecified: Secondary | ICD-10-CM | POA: Diagnosis present

## 2017-09-25 DIAGNOSIS — N184 Chronic kidney disease, stage 4 (severe): Secondary | ICD-10-CM

## 2017-09-25 LAB — BASIC METABOLIC PANEL
ANION GAP: 13 (ref 5–15)
BUN: 39 mg/dL — ABNORMAL HIGH (ref 6–20)
CO2: 29 mmol/L (ref 22–32)
Calcium: 10.1 mg/dL (ref 8.9–10.3)
Chloride: 93 mmol/L — ABNORMAL LOW (ref 101–111)
Creatinine, Ser: 2.5 mg/dL — ABNORMAL HIGH (ref 0.44–1.00)
GFR calc non Af Amer: 20 mL/min — ABNORMAL LOW (ref >60)
GFR, EST AFRICAN AMERICAN: 24 mL/min — AB (ref >60)
Glucose, Bld: 153 mg/dL — ABNORMAL HIGH (ref 65–99)
POTASSIUM: 3.5 mmol/L (ref 3.5–5.1)
SODIUM: 135 mmol/L (ref 135–145)

## 2017-09-25 LAB — CBC
HEMATOCRIT: 42.3 % (ref 35.0–47.0)
HEMOGLOBIN: 14.2 g/dL (ref 12.0–16.0)
MCH: 31.9 pg (ref 26.0–34.0)
MCHC: 33.6 g/dL (ref 32.0–36.0)
MCV: 95 fL (ref 80.0–100.0)
Platelets: 98 10*3/uL — ABNORMAL LOW (ref 150–440)
RBC: 4.45 MIL/uL (ref 3.80–5.20)
RDW: 13 % (ref 11.5–14.5)
WBC: 10.8 10*3/uL (ref 3.6–11.0)

## 2017-09-25 LAB — URINALYSIS, COMPLETE (UACMP) WITH MICROSCOPIC
BACTERIA UA: NONE SEEN
Bilirubin Urine: NEGATIVE
GLUCOSE, UA: NEGATIVE mg/dL
Ketones, ur: NEGATIVE mg/dL
LEUKOCYTES UA: NEGATIVE
NITRITE: NEGATIVE
PROTEIN: NEGATIVE mg/dL
Specific Gravity, Urine: 1.013 (ref 1.005–1.030)
pH: 6 (ref 5.0–8.0)

## 2017-09-25 LAB — VALPROIC ACID LEVEL: VALPROIC ACID LVL: 89 ug/mL (ref 50.0–100.0)

## 2017-09-25 LAB — TROPONIN I: Troponin I: <0.03 ng/mL (ref <0.03)

## 2017-09-25 MED ORDER — ONDANSETRON HCL 4 MG/2ML IJ SOLN: 4.0000 mg | Freq: Four times a day (QID) | INTRAMUSCULAR | Status: DC | PRN

## 2017-09-25 MED ORDER — SODIUM CHLORIDE 0.9 % IV BOLUS (SEPSIS)
1000.0000 mL | Freq: Once | INTRAVENOUS | Status: AC
  Administered 2017-09-25: 1000 mL via INTRAVENOUS

## 2017-09-25 MED ORDER — QUETIAPINE FUMARATE 100 MG PO TABS
150.0000 mg | ORAL_TABLET | Freq: Two times a day (BID) | ORAL | Status: DC
  Administered 2017-09-25 – 2017-09-29 (×8): 150 mg via ORAL
  Filled 2017-09-25 (×2): qty 6
  Filled 2017-09-25: qty 3
  Filled 2017-09-25 (×2): qty 1.5
  Filled 2017-09-25: qty 6
  Filled 2017-09-25 (×5): qty 1.5
  Filled 2017-09-25 (×2): qty 6
  Filled 2017-09-25: qty 1.5

## 2017-09-25 MED ORDER — ACETAMINOPHEN 325 MG PO TABS
650.0000 mg | ORAL_TABLET | Freq: Four times a day (QID) | ORAL | Status: DC | PRN
  Administered 2017-09-26 – 2017-09-29 (×4): 650 mg via ORAL
  Filled 2017-09-25 (×4): qty 2

## 2017-09-25 MED ORDER — SODIUM CHLORIDE 0.9 % IV SOLN
INTRAVENOUS | Status: DC
  Administered 2017-09-25 – 2017-09-28 (×7): via INTRAVENOUS

## 2017-09-25 MED ORDER — ACETAMINOPHEN 650 MG RE SUPP: 650.0000 mg | Freq: Four times a day (QID) | RECTAL | Status: DC | PRN

## 2017-09-25 MED ORDER — DIVALPROEX SODIUM 250 MG PO DR TAB
750.0000 mg | DELAYED_RELEASE_TABLET | Freq: Every evening | ORAL | Status: DC
  Administered 2017-09-25 – 2017-09-26 (×2): 750 mg via ORAL
  Filled 2017-09-25 (×3): qty 3

## 2017-09-25 MED ORDER — HEPARIN SODIUM (PORCINE) 5000 UNIT/ML IJ SOLN
5000.0000 [IU] | Freq: Three times a day (TID) | INTRAMUSCULAR | Status: DC
  Administered 2017-09-25 – 2017-09-29 (×11): 5000 [IU] via SUBCUTANEOUS
  Filled 2017-09-25 (×11): qty 1

## 2017-09-25 MED ORDER — DIVALPROEX SODIUM 500 MG PO DR TAB
500.0000 mg | DELAYED_RELEASE_TABLET | Freq: Every morning | ORAL | Status: DC
  Administered 2017-09-26 – 2017-09-29 (×4): 500 mg via ORAL
  Filled 2017-09-25 (×4): qty 1

## 2017-09-25 MED ORDER — ONDANSETRON HCL 4 MG PO TABS: 4.0000 mg | ORAL_TABLET | Freq: Four times a day (QID) | ORAL | Status: DC | PRN

## 2017-09-25 MED ORDER — ASPIRIN EC 81 MG PO TBEC
81.0000 mg | DELAYED_RELEASE_TABLET | Freq: Every day | ORAL | Status: DC
  Administered 2017-09-25 – 2017-09-29 (×5): 81 mg via ORAL
  Filled 2017-09-25 (×5): qty 1

## 2017-09-25 MED ORDER — TRAZODONE HCL 100 MG PO TABS
300.0000 mg | ORAL_TABLET | Freq: Every day | ORAL | Status: DC
  Administered 2017-09-25 – 2017-09-28 (×4): 300 mg via ORAL
  Filled 2017-09-25 (×5): qty 3

## 2017-09-25 MED ORDER — VITAMIN B-12 1000 MCG PO TABS
1000.0000 ug | ORAL_TABLET | Freq: Every day | ORAL | Status: DC
Start: 2017-09-25 — End: 2017-09-29
  Administered 2017-09-25 – 2017-09-29 (×5): 1000 ug via ORAL
  Filled 2017-09-25 (×5): qty 1

## 2017-09-25 NOTE — ED Triage Notes (Signed)
Pt here with legal guardian that she lives with who reports pt has been feeling weak and falling around. Per pt she hit her head a few days ago. Pt also injured rt ankle with fall. Pt denies pain other than ankle.

## 2017-09-25 NOTE — H&P (Signed)
[employer] - [place] [facility]   PATIENT NAME: Jenna Riggs    MR#:  [id number]  DATE OF BIRTH:  Jan 24, 1961  DATE OF ADMISSION:  09/25/2017  PRIMARY CARE PHYSICIAN: Patient, No Pcp Per   REQUESTING/REFERRING PHYSICIAN: Dr. Marisa SeverinSiadecki  CHIEF COMPLAINT:   Chief Complaint  Patient presents with  . Weakness  . Fall    HISTORY OF PRESENT ILLNESS:  Jenna Riggs  is a 57 y.o. female with a known history of mental retardation, essential hypertension, bipolar disorder who presents to the hospital due to weakness, fall. Patient is a poor historian therefore most of the history obtained from the chart and from the ER physician. Patient apparently had a fall a few days back and has been having significant weakness since then. She has also felt somewhat tremulous and lightheaded and dizzy when attempting to ambulate. She's been having some pain in that right foot since the fall. She came to the ER underwent x-rays of her right foot which showed no acute fracture but some postoperative changes, a CT head is negative for acute pathology. She was noted to be in acute kidney injury and hospitalist services were contacted further treatment and evaluation.  PAST MEDICAL HISTORY:   Past Medical History:  Diagnosis Date  . Mental retardation     PAST SURGICAL HISTORY:   Past Surgical History:  Procedure Laterality Date  . ANKLE FUSION Right     SOCIAL HISTORY:   Social History   Tobacco Use  . Smoking status: Current Every Day Smoker    Packs/day: 1.00    Years: 35.00    Pack years: 35.00    Types: Cigarettes  . Smokeless tobacco: Never Used  . Tobacco comment: Patient refused  Substance Use Topics  . Alcohol use: No    FAMILY HISTORY:   Family History  Problem Relation Age of Onset  . Diabetes Mother   . Diabetes Father     DRUG ALLERGIES:  No Known Allergies  REVIEW OF SYSTEMS:   Review of Systems  Constitutional: Negative for fever and weight loss.   HENT: Negative for congestion, nosebleeds and tinnitus.   Eyes: Negative for blurred vision, double vision and redness.  Respiratory: Negative for cough, hemoptysis and shortness of breath.   Cardiovascular: Negative for chest pain, orthopnea, leg swelling and PND.  Gastrointestinal: Negative for abdominal pain, diarrhea, melena, nausea and vomiting.  Genitourinary: Negative for dysuria, hematuria and urgency.  Musculoskeletal: Negative for falls and joint pain.  Neurological: Positive for weakness. Negative for dizziness, tingling, sensory change, focal weakness, seizures and headaches.  Endo/Heme/Allergies: Negative for polydipsia. Does not bruise/bleed easily.  Psychiatric/Behavioral: Negative for depression and memory loss. The patient is not nervous/anxious.     MEDICATIONS AT HOME:   Prior to Admission medications   Medication Sig Start Date End Date Taking? Authorizing Provider  aspirin EC 81 MG tablet Take 81 mg by mouth daily.   Yes [provider]  divalproex (DEPAKOTE) 250 MG DR tablet Take 1,250 mg by mouth daily. 500mg -AM/750mg -PM 08/26/17  Yes [provider]  hydrochlorothiazide (HYDRODIURIL) 25 MG tablet Take 1 tablet (25 mg total) by mouth daily. 09/23/16  Yes Pucilowska, Jolanta B, MD  QUEtiapine (SEROQUEL) 100 MG tablet Take 1 tablet (100 mg total) by mouth 2 (two) times daily. Patient taking differently: Take 150 mg by mouth 2 (two) times daily.  09/22/16  Yes Pucilowska, Jolanta B, MD  traZODone (DESYREL) 100 MG tablet Take 300 mg by mouth at  bedtime.   Yes [provider]  vitamin B-12 1000 MCG tablet Take 1 tablet (1,000 mcg total) by mouth daily. 09/24/16  Yes Pucilowska, Jolanta B, MD  traZODone (DESYREL) 300 MG tablet Take 1 tablet (300 mg total) by mouth at bedtime. Patient not taking: Reported on 09/25/2017 09/22/16   Pucilowska, Braulio Conte B, MD  valproic acid (DEPAKENE) 250 MG capsule Take 2 capsules (500 mg total) by mouth every  morning. Patient not taking: Reported on 09/25/2017 09/23/16   Pucilowska, Braulio Conte B, MD  valproic acid (DEPAKENE) 250 MG capsule Take 3 capsules (750 mg total) by mouth at bedtime. Patient not taking: Reported on 09/25/2017 09/22/16   Pucilowska, Ellin Goodie, MD      VITAL SIGNS:  Blood pressure (!) 114/102, pulse 94, temperature 98.6 F (37 C), temperature source Oral, resp. rate 14, height 5\' 3"  (1.6 m), weight 117.9 kg (260 lb), SpO2 99 %.  PHYSICAL EXAMINATION:  Physical Exam  GENERAL:  57 y.o.-year-old patient lying in bed tremulous but in NAD.  EYES: Pupils equal, round, reactive to light and accommodation. No scleral icterus. Extraocular muscles intact.  HEENT: Head atraumatic, normocephalic. Oropharynx and nasopharynx clear. No oropharyngeal erythema, moist oral mucosa  NECK:  Supple, no jugular venous distention. No thyroid enlargement, no tenderness.  LUNGS: Normal breath sounds bilaterally, no wheezing, rales, rhonchi. No use of accessory muscles of respiration.  CARDIOVASCULAR: S1, S2 RRR. No murmurs, rubs, gallops, clicks.  ABDOMEN: Soft, nontender, nondistended. Bowel sounds present. No organomegaly or mass.  EXTREMITIES: No pedal edema, cyanosis, or clubbing. + 2 pedal & radial pulses b/l.   NEUROLOGIC: Cranial nerves II through XII are intact. No focal Motor or sensory deficits appreciated b/l. + Tremor.  PSYCHIATRIC: The patient is alert and oriented x 3.  SKIN: No obvious rash, lesion, or ulcer.   LABORATORY PANEL:   CBC Recent Labs  Lab 09/25/17 1124  WBC 10.8  HGB 14.2  HCT 42.3  PLT 98*   ------------------------------------------------------------------------------------------------------------------  Chemistries  Recent Labs  Lab 09/25/17 1124  NA 135  K 3.5  CL 93*  CO2 29  GLUCOSE 153*  BUN 39*  CREATININE 2.50*  CALCIUM 10.1    ------------------------------------------------------------------------------------------------------------------  Cardiac Enzymes Recent Labs  Lab 09/25/17 1124  TROPONINI <0.03   ------------------------------------------------------------------------------------------------------------------  RADIOLOGY:  Dg Chest 2 View  Result Date: 09/25/2017 CLINICAL DATA:  Generalized weakness and malaise. Multiple falls. Mental retardation. EXAM: CHEST  2 VIEW COMPARISON:  None. FINDINGS: The heart size and mediastinal contours are within normal limits. Both lungs are clear. The visualized skeletal structures are unremarkable. IMPRESSION: No active cardiopulmonary disease. Electronically Signed   By: Elsie Stain M.D.   On: 09/25/2017 14:29   Dg Ankle Complete Right  Result Date: 09/25/2017 CLINICAL DATA:  Pt here with legal guardian that she lives with who reports pt has been feeling weak and falling around. Per pt she hit her head a few days ago. Pt also injured rt ankle with fall. Pt denies pain other than ankle. EXAM: RIGHT ANKLE - COMPLETE 3+ VIEW COMPARISON:  None. FINDINGS: Previous plate and screw fixation of distal fibular shaft. Previous orthopedic screw fixation of transverse medial malleolus fracture in near anatomic alignment; the fracture line is evident, distracted approximately 2 mm. Ankle mortise appears intact. There is bilateral soft tissue swelling. No subcutaneous gas or radiodense foreign body. Calcaneal spur at the insertion of the plantar aponeurosis. IMPRESSION: 1. Postop changes in medial and lateral malleoli without evident complicating or acute features.  Comparison with outside prior imaging may be useful to evaluate for subtle interval changes. Electronically Signed   By: Corlis Leak M.D.   On: 09/25/2017 14:01   Ct Head Wo Contrast  Result Date: 09/25/2017 CLINICAL DATA:  Weakness, falling, hit her head a few days ago EXAM: CT HEAD WITHOUT CONTRAST TECHNIQUE: Contiguous  axial images were obtained from the base of the skull through the vertex without intravenous contrast. Sagittal and coronal MPR images reconstructed from axial data set. COMPARISON:  02/12/2015 FINDINGS: Brain: Generalized atrophy. Dilatation of the RIGHT lateral ventricle. Congenital absence of the corpus callosum. Atrophy of the LEFT cerebellar hemisphere with prominent CSF in the LEFT posterior cranial fossa. No intracranial hemorrhage, mass lesion or evidence acute infarction. 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 No acute intracranial abnormalities. Electronically Signed   By: Ulyses Southward M.D.   On: 09/25/2017 14:05     IMPRESSION AND PLAN:   57 year old female with past medical history of bipolar disorder, mental retardation, essential hypertension who presents to the hospital due to fall, weakness and noted to be in acute kidney injury.   1. Acute renal failure-secondary to dehydration and also use of diuretics. -I will gently hydrate with IV fluids, follow BUN and creatinine and urine output. Hold her hydrochlorothiazide.  2. Generalized weakness/tremor/falls-etiology unclear presently. I will check her Depakote level, we'll get physical therapy consult to assess her mobility.  3. Thrombocytopenia-this is new. -No acute bleeding. Will follow platelet count.  4. History of mental retardation/bipolar disorder-continue Depakote, Seroquel. -I will check a Depakote level.  5. Essential hypertension-hold hydrochlorothiazide given her acute kidney injury. Follow blood pressure and if needed will add some IV when necessary meds.    All the records are reviewed and case discussed with ED provider. Management plans discussed with the patient, family and they are in agreement.  CODE STATUS: Full code  TOTAL  TIME TAKING CARE OF THIS PATIENT: 40 minutes.    Houston Siren M.D on 09/25/2017 at 5:33 PM  Between 7am to 6pm - Pager - [phone]  After 6pm go to [email or web address] - password [email or web address]  [facility]  Office  [phone]  CC: Primary care physician; Patient, No Pcp Per

## 2017-09-25 NOTE — ED Notes (Signed)
Pt given meal tray and water 

## 2017-09-25 NOTE — ED Notes (Signed)
Floor unable to accept report at this time.

## 2017-09-25 NOTE — ED Notes (Signed)
In and out cath for clear yellow urine. Then she voided on the bedpan,.

## 2017-09-25 NOTE — ED Provider Notes (Signed)
[facility] Emergency Department Provider Note ____________________________________________   First MD Initiated Contact with Patient 09/25/17 1331     (approximate)  I have reviewed the triage vital signs and the nursing notes.   HISTORY  Chief Complaint Weakness and Fall  History of present illness provided by patient and her guardian  HPI Jenna Riggs is a 57 y.o. female with past medical history of intellectual disability and other PMH as noted below who presents with generalized weakness over the last 2 days, gradual onset, associated with shakiness and tremors, as well as with multiple falls.  During the falls, the patient injured her right ankle today, as well as hit her head last night.  The patient states that she is feeling lightheaded and dizzy, but denies chest pain, fevers, vomiting, or urinary symptoms.  There have been no new medications or changes in her medication regimen recently.  Past Medical History:  Diagnosis Date  . Mental retardation     Patient Active Problem List   Diagnosis Date Noted  . Vitamin B12 deficiency 09/20/2016  . Tobacco use disorder 09/17/2016  . UTI (urinary tract infection) 09/17/2016  . 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 HTN (hypertension) 02/13/2015  . 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 Impulse control disorder (skin picking) 02/13/2015  . Bipolar I disorder with anxious distress (HCC) 02/12/2015    Past Surgical History:  Procedure Laterality Date  . ANKLE FUSION Right     Prior to Admission medications   Medication Sig Start Date End Date Taking? Authorizing Provider  aspirin EC 81 MG tablet Take 81 mg by mouth daily.   Yes [provider]  divalproex (DEPAKOTE) 250 MG DR tablet Take 1,250 mg by mouth daily. 500mg -AM/750mg -PM 08/26/17  Yes [provider]  hydrochlorothiazide (HYDRODIURIL) 25 MG tablet Take 1 tablet (25 mg total) by mouth daily. 09/23/16  Yes  Pucilowska, Jolanta B, MD  QUEtiapine (SEROQUEL) 100 MG tablet Take 1 tablet (100 mg total) by mouth 2 (two) times daily. Patient taking differently: Take 150 mg by mouth 2 (two) times daily.  09/22/16  Yes Pucilowska, Jolanta B, MD  traZODone (DESYREL) 100 MG tablet Take 300 mg by mouth at bedtime.   Yes [provider]  vitamin B-12 1000 MCG tablet Take 1 tablet (1,000 mcg total) by mouth daily. 09/24/16  Yes Pucilowska, Jolanta B, MD  traZODone (DESYREL) 300 MG tablet Take 1 tablet (300 mg total) by mouth at bedtime. Patient not taking: Reported on 09/25/2017 09/22/16   Pucilowska, Braulio ConteJolanta B, MD  valproic acid (DEPAKENE) 250 MG capsule Take 2 capsules (500 mg total) by mouth every morning. Patient not taking: Reported on 09/25/2017 09/23/16   Pucilowska, Braulio ConteJolanta B, MD  valproic acid (DEPAKENE) 250 MG capsule Take 3 capsules (750 mg total) by mouth at bedtime. Patient not taking: Reported on 09/25/2017 09/22/16   Shari ProwsPucilowska, Jolanta B, MD    Allergies Patient has no known allergies.  No family history on file.  Social History Social History   Tobacco Use  . Smoking status: Current Every Day Smoker    Packs/day: 0.00    Years: 35.00    Pack years: 0.00    Types: Cigarettes  . Smokeless tobacco: Never Used  . Tobacco comment: Patient refused  Substance Use Topics  . Alcohol use: No  . Drug use: No    Review of Systems  Constitutional: No fever/chills. Eyes: No visual changes or redness. ENT: No neck pain. Cardiovascular: Denies chest  pain. Respiratory: Denies shortness of breath. Gastrointestinal: No nausea, no vomiting.  No diarrhea.  Genitourinary: Negative for dysuria or change in frequency.  Musculoskeletal: Negative for back pain.  Positive for right ankle pain. Skin: Negative for rash. Neurological: Negative for headache.   ____________________________________________   PHYSICAL EXAM:  VITAL SIGNS: ED Triage Vitals [09/25/17 1117]  Enc Vitals Group      BP (!) 140/93     Pulse Rate (!) 110     Resp 20     Temp 98.6 F (37 C)     Temp Source Oral     SpO2 97 %     Weight 260 lb (117.9 kg)     Height 5\' 3"  (1.6 m)     Head Circumference      Peak Flow      Pain Score 7     Pain Loc      Pain Edu?      Excl. in GC?     Constitutional: Alert and oriented.  Relatively comfortable appearing. Eyes: Conjunctivae are normal.  EOMI.  PERRLA. Head: Atraumatic. Nose: No congestion/rhinnorhea. Mouth/Throat: Mucous membranes are dry.   Neck: Normal range of motion.  Cardiovascular: Tachycardic, regular rhythm. Grossly normal heart sounds.  Good peripheral circulation. Respiratory: Normal respiratory effort.  No retractions.  Somewhat decreased breath sounds bilaterally but lungs otherwise CTAB. Gastrointestinal: Soft and nontender. No distention.  Genitourinary: No CVA tenderness. Musculoskeletal: No lower extremity edema.  Extremities warm and well perfused.  Right ankle with moderate swelling, tenderness bilateral malleoli and bruising.  2+ DP pulses bilaterally.  Full range of motion of the joints, no deformity. Neurologic:  Normal speech and language.  Motor and sensory intact in all extremities.  Normal coordination, but with moderate tremor on finger to nose.  No gross focal neurologic deficits are appreciated.  Skin:  Skin is warm and dry. No rash noted. Psychiatric: Mood and affect are normal. Speech and behavior are normal.  ____________________________________________   LABS (all labs ordered are listed, but only abnormal results are displayed)  Labs Reviewed  BASIC METABOLIC PANEL - Abnormal; Notable for the following components:      Result Value   Chloride 93 (*)    Glucose, Bld 153 (*)    BUN 39 (*)    Creatinine, Ser 2.50 (*)    GFR calc non Af Amer 20 (*)    GFR calc Af Amer 24 (*)    All other components within normal limits  CBC - Abnormal; Notable for the following components:   Platelets 98 (*)    All other  components within normal limits  URINALYSIS, COMPLETE (UACMP) WITH MICROSCOPIC - Abnormal; Notable for the following components:   Color, Urine YELLOW (*)    APPearance CLEAR (*)    Hgb urine dipstick SMALL (*)    Squamous Epithelial / LPF 0-5 (*)    All other components within normal limits  TROPONIN I   ____________________________________________  EKG  ED ECG REPORT I, Dionne Bucy, the attending physician, personally viewed and interpreted this ECG.  Date: 09/25/2017 EKG Time: 1126 Rate: 111 Rhythm: Sinus tachycardia QRS Axis: normal Intervals: normal ST/T Wave abnormalities: Nonspecific T wave abnormalities Narrative Interpretation: No acute ischemia; onspecific changes when compared to EKG of 09/18/2016  ____________________________________________  RADIOLOGY  CT head: ICH or other acute findings CXR: No focal infiltrate XR R ankle: No acute fracture  ____________________________________________   PROCEDURES  Procedure(s) performed: No    Critical Care performed: No  ____________________________________________   INITIAL IMPRESSION / ASSESSMENT AND PLAN / ED COURSE  Pertinent labs & imaging results that were available during my care of the patient were reviewed by me and considered in my medical decision making (see chart for details).  58 year old female with past medical history as noted above presents with generalized weakness, dizziness, and tremor over the last 2 days, accompanied by several falls in which she reports hitting her head as well as injuring her right ankle.  Past medical records reviewed in Epic; patient's several most recent ED/hospital visits were for mental health symptoms, and patient is currently at her baseline and with no acute psychiatric symptoms.  On exam, patient is slightly tachycardic but her other vital signs are normal.  She does have some tremor on finger to nose, and appears clinically dehydrated.  There is evidence  of injury to the right ankle but no other significant trauma on exam.    In terms of the increased weakness and falls, differential includes dehydration, renal insufficiency, other metabolic cause, medication side effects, infection, or less likely cardiac.  Also consider less likely CNS cause such as hydrocephalus although patient has no focal neuro findings to support this.  No evidence of CVA.  I am also somewhat concerned for a fracture to the right ankle, so we will obtain x-rays of this in addition to the workup for etiology of the weakness.   ----------------------------------------- 5:09 PM on 09/25/2017 -----------------------------------------  Patient's lab workup reveals acute on chronic renal insufficiency.  I suspect this is likely related to dehydration but it is a significant worsening for the patient.  Given her generalized weakness and frequent falls, patient is appropriate for admission.  ____________________________________________   FINAL CLINICAL IMPRESSION(S) / ED DIAGNOSES  Final diagnoses:  Acute renal insufficiency  Dehydration  Generalized weakness      NEW MEDICATIONS STARTED DURING THIS VISIT:  New Prescriptions   No medications on file     Note:  This document was prepared using Dragon voice recognition software and may include unintentional dictation errors.    Dionne Bucy, MD 09/25/17 [phone]

## 2017-09-25 NOTE — ED Notes (Signed)
FIRST NURSE NOTE-here for generalized weakness and malaise since Saturday. NAD. Alert and answering questions.

## 2017-09-26 DIAGNOSIS — E860 Dehydration: Secondary | ICD-10-CM | POA: Diagnosis present

## 2017-09-26 DIAGNOSIS — D6959 Other secondary thrombocytopenia: Secondary | ICD-10-CM | POA: Diagnosis present

## 2017-09-26 DIAGNOSIS — I129 Hypertensive chronic kidney disease with stage 1 through stage 4 chronic kidney disease, or unspecified chronic kidney disease: Secondary | ICD-10-CM | POA: Diagnosis present

## 2017-09-26 DIAGNOSIS — Z9181 History of falling: Secondary | ICD-10-CM | POA: Diagnosis not present

## 2017-09-26 DIAGNOSIS — T426X5A Adverse effect of other antiepileptic and sedative-hypnotic drugs, initial encounter: Secondary | ICD-10-CM | POA: Diagnosis present

## 2017-09-26 DIAGNOSIS — N179 Acute kidney failure, unspecified: Secondary | ICD-10-CM | POA: Diagnosis present

## 2017-09-26 DIAGNOSIS — Y92009 Unspecified place in unspecified non-institutional (private) residence as the place of occurrence of the external cause: Secondary | ICD-10-CM | POA: Diagnosis not present

## 2017-09-26 DIAGNOSIS — Z7982 Long term (current) use of aspirin: Secondary | ICD-10-CM | POA: Diagnosis not present

## 2017-09-26 DIAGNOSIS — Q613 Polycystic kidney, unspecified: Secondary | ICD-10-CM | POA: Diagnosis not present

## 2017-09-26 DIAGNOSIS — R251 Tremor, unspecified: Secondary | ICD-10-CM | POA: Diagnosis present

## 2017-09-26 DIAGNOSIS — F17210 Nicotine dependence, cigarettes, uncomplicated: Secondary | ICD-10-CM | POA: Diagnosis present

## 2017-09-26 DIAGNOSIS — N2581 Secondary hyperparathyroidism of renal origin: Secondary | ICD-10-CM | POA: Diagnosis present

## 2017-09-26 DIAGNOSIS — F319 Bipolar disorder, unspecified: Secondary | ICD-10-CM | POA: Diagnosis present

## 2017-09-26 DIAGNOSIS — R296 Repeated falls: Secondary | ICD-10-CM | POA: Diagnosis present

## 2017-09-26 DIAGNOSIS — F79 Unspecified intellectual disabilities: Secondary | ICD-10-CM | POA: Diagnosis present

## 2017-09-26 DIAGNOSIS — N184 Chronic kidney disease, stage 4 (severe): Secondary | ICD-10-CM | POA: Diagnosis present

## 2017-09-26 DIAGNOSIS — E876 Hypokalemia: Secondary | ICD-10-CM | POA: Diagnosis present

## 2017-09-26 LAB — CBC
HCT: 36.5 % (ref 35.0–47.0)
Hemoglobin: 12.2 g/dL (ref 12.0–16.0)
MCH: 32.1 pg (ref 26.0–34.0)
MCHC: 33.4 g/dL (ref 32.0–36.0)
MCV: 96.1 fL (ref 80.0–100.0)
PLATELETS: 94 10*3/uL — AB (ref 150–440)
RBC: 3.8 MIL/uL (ref 3.80–5.20)
RDW: 13.1 % (ref 11.5–14.5)
WBC: 4.6 10*3/uL (ref 3.6–11.0)

## 2017-09-26 LAB — MAGNESIUM: MAGNESIUM: 1.7 mg/dL (ref 1.7–2.4)

## 2017-09-26 LAB — BASIC METABOLIC PANEL
Anion gap: 8 (ref 5–15)
BUN: 35 mg/dL — AB (ref 6–20)
CALCIUM: 8.9 mg/dL (ref 8.9–10.3)
CHLORIDE: 100 mmol/L — AB (ref 101–111)
CO2: 29 mmol/L (ref 22–32)
CREATININE: 2.33 mg/dL — AB (ref 0.44–1.00)
GFR calc non Af Amer: 22 mL/min — ABNORMAL LOW (ref >60)
GFR, EST AFRICAN AMERICAN: 26 mL/min — AB (ref >60)
Glucose, Bld: 148 mg/dL — ABNORMAL HIGH (ref 65–99)
Potassium: 2.8 mmol/L — ABNORMAL LOW (ref 3.5–5.1)
SODIUM: 137 mmol/L (ref 135–145)

## 2017-09-26 MED ORDER — MAGNESIUM SULFATE 2 GM/50ML IV SOLN
2.0000 g | Freq: Once | INTRAVENOUS | Status: AC
  Administered 2017-09-26: 2 g via INTRAVENOUS
  Filled 2017-09-26: qty 50

## 2017-09-26 MED ORDER — POTASSIUM CHLORIDE 20 MEQ PO PACK
40.0000 meq | PACK | ORAL | Status: AC
Start: 2017-09-26 — End: 2017-09-26
  Administered 2017-09-26 (×2): 40 meq via ORAL
  Filled 2017-09-26 (×2): qty 2

## 2017-09-26 NOTE — Progress Notes (Signed)
PT Cancellation Note  Patient Details Name: Jenna AblesLinda Riggs MRN: [id number] DOB: November 27, 1960   Cancelled Treatment:    Reason Eval/Treat Not Completed: Medical issues which prohibited therapy; Pt's Ka currently 2.8 trending down and is outside guidelines for participation with PT services at this time.  Will attempt to see pt at a future date/time as medically appropriate.    Ovidio Hanger. Scott Lurie Mullane PT, DPT 09/26/17, 8:29 AM

## 2017-09-26 NOTE — Consult Note (Signed)
PHARMACY CONSULT NOTE - INITIAL   Pharmacy Consult for Electrolyte Monitoring and Replacement   No Known Allergies  Patient Measurements: Height: 5\' 3"  (160 cm) Weight: 221 lb 12.5 oz (100.6 kg) IBW/kg (Calculated) : 52.4  Vital Signs: Temp: 98.4 F (36.9 C) (01/29 0540) BP: 119/71 (01/29 0540) Pulse Rate: 68 (01/29 0540) Intake/Output from previous day: 01/28 0701 - 01/29 0700 In: 641.7 [I.V.:641.7] Out: -  Intake/Output from this shift: Total I/O In: 360 [P.O.:360] Out: -   Labs: Recent Labs    09/25/17 1124 09/26/17 0635  WBC 10.8 4.6  HGB 14.2 12.2  HCT 42.3 36.5  PLT 98* 94*  CREATININE 2.50* 2.33*  MG  --  1.7   Potassium (mmol/L)  Date Value  09/26/2017 2.8 (L)  03/24/2014 4.0   Magnesium (mg/dL)  Date Value  19/14/782901/29/2019 1.7   Calcium (mg/dL)  Date Value  56/21/308601/29/2019 8.9   Calcium, Total (mg/dL)  Date Value  57/84/696207/27/2015 8.5   Albumin (g/dL)  Date Value  95/28/413201/25/2018 3.7  03/24/2014 3.1 (L)  ] Estimated Creatinine Clearance: 30.5 mL/min (A) (by C-G formula based on SCr of 2.33 mg/dL (H)).  Assessment: Pharmacy consulted for electrolyte monitoring and replacement in 57 yo female admitted with acute renal failure secondary to dehydration.   Goal of Therapy:  Electrolytes WNL  Plan:  1/29 KCL 40mEq x 2 po and magnesium 2gm IV x 1 dose ordered.  Will recheck electrolytes in AM, and continue to replace as needed.   Gardner CandleSheema M Kennedy Brines, PharmD, BCPS Clinical Pharmacist 09/26/2017 11:29 AM

## 2017-09-26 NOTE — Progress Notes (Addendum)
[facility] - [place] at [facility]   PATIENT NAME: Jenna Riggs    MR#:  [id number]  DATE OF BIRTH:  May 01, 1961  SUBJECTIVE:  CHIEF COMPLAINT: Patient is a poor historian, has mental retardation  REVIEW OF SYSTEMS:  CONSTITUTIONAL: Reporting generalized weakness.  EYES: No blurred or double vision.  EARS, NOSE, AND THROAT: No tinnitus or ear pain.  RESPIRATORY: No cough, shortness of breath, wheezing or hemoptysis.  CARDIOVASCULAR: No chest pain, orthopnea, edema.  GASTROINTESTINAL: No nausea, vomiting, diarrhea or abdominal pain.  HEMATOLOGY: No anemia, easy bruising or bleeding SKIN: No rash or lesion. MUSCULOSKELETAL: No joint pain or arthritis.   NEUROLOGIC: No tingling, numbness, weakness.  PSYCHIATRY: No anxiety or depression.   DRUG ALLERGIES:  No Known Allergies  VITALS:  Blood pressure 132/67, pulse 73, temperature (!) 97.4 F (36.3 C), temperature source Oral, resp. rate 15, height 5\' 3"  (1.6 m), weight 100.6 kg (221 lb 12.5 oz), SpO2 98 %.  PHYSICAL EXAMINATION:  GENERAL:  57 y.o.-year-old patient lying in the bed with no acute distress.  EYES: Pupils equal, round, reactive to light and accommodation. No scleral icterus. Extraocular muscles intact.  HEENT: Head atraumatic, normocephalic. Oropharynx and nasopharynx clear.  NECK:  Supple, no jugular venous distention. No thyroid enlargement, no tenderness.  LUNGS: Normal breath sounds bilaterally, no wheezing, rales,rhonchi or crepitation. No use of accessory muscles of respiration.  CARDIOVASCULAR: S1, S2 normal. No murmurs, rubs, or gallops.  ABDOMEN: Soft, nontender, nondistended. Bowel sounds present. No organomegaly or mass.  EXTREMITIES: No pedal edema, cyanosis, or clubbing.  NEUROLOGIC: Cranial nerves II through XII are intact. Sensation intact. Gait not checked.  PSYCHIATRIC: The patient is alert and oriented x 3.  SKIN: No obvious rash, lesion, or ulcer.    LABORATORY PANEL:    CBC Recent Labs  Lab 09/26/17 0635  WBC 4.6  HGB 12.2  HCT 36.5  PLT 94*   ------------------------------------------------------------------------------------------------------------------  Chemistries  Recent Labs  Lab 09/26/17 0635  NA 137  K 2.8*  CL 100*  CO2 29  GLUCOSE 148*  BUN 35*  CREATININE 2.33*  CALCIUM 8.9  MG 1.7   ------------------------------------------------------------------------------------------------------------------  Cardiac Enzymes Recent Labs  Lab 09/25/17 1124  TROPONINI <0.03   ------------------------------------------------------------------------------------------------------------------  RADIOLOGY:  Dg Chest 2 View  Result Date: 09/25/2017 CLINICAL DATA:  Generalized weakness and malaise. Multiple falls. Mental retardation. EXAM: CHEST  2 VIEW COMPARISON:  None. FINDINGS: The heart size and mediastinal contours are within normal limits. Both lungs are clear. The visualized skeletal structures are unremarkable. IMPRESSION: No active cardiopulmonary disease. Electronically Signed   By: Elsie Stain M.D.   On: 09/25/2017 14:29   Dg Ankle Complete Right  Result Date: 09/25/2017 CLINICAL DATA:  Pt here with legal guardian that she lives with who reports pt has been feeling weak and falling around. Per pt she hit her head a few days ago. Pt also injured rt ankle with fall. Pt denies pain other than ankle. EXAM: RIGHT ANKLE - COMPLETE 3+ VIEW COMPARISON:  None. FINDINGS: Previous plate and screw fixation of distal fibular shaft. Previous orthopedic screw fixation of transverse medial malleolus fracture in near anatomic alignment; the fracture line is evident, distracted approximately 2 mm. Ankle mortise appears intact. There is bilateral soft tissue swelling. No subcutaneous gas or radiodense foreign body. Calcaneal spur at the insertion of the plantar aponeurosis. IMPRESSION: 1. Postop changes in medial and lateral malleoli without evident  complicating or acute features. Comparison with outside prior imaging  may be useful to evaluate for subtle interval changes. Electronically Signed   By: Corlis Leak  Hassell M.D.   On: 09/25/2017 14:01   Ct Head Wo Contrast  Result Date: 09/25/2017 CLINICAL DATA:  Weakness, falling, hit her head a few days ago EXAM: CT HEAD WITHOUT CONTRAST TECHNIQUE: Contiguous axial images were obtained from the base of the skull through the vertex without intravenous contrast. Sagittal and coronal MPR images reconstructed from axial data set. COMPARISON:  02/12/2015 FINDINGS: Brain: Generalized atrophy. Dilatation of the RIGHT lateral ventricle. Congenital absence of the corpus callosum. Atrophy of the LEFT cerebellar hemisphere with prominent CSF in the LEFT posterior cranial fossa. No intracranial hemorrhage, mass lesion or evidence acute infarction. 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 No acute intracranial abnormalities. Electronically Signed   By: Ulyses SouthwardMark  Boles M.D.   On: 09/25/2017 14:05    EKG:   Orders placed or performed during the hospital encounter of 09/25/17  . ED EKG  . ED EKG    ASSESSMENT AND PLAN:    57 year old female with past medical history of bipolar disorder, mental retardation, essential hypertension who presents to the hospital due to fall, weakness and noted to be in acute kidney injury.   #. Acute renal failure-secondary to dehydration and also use of diuretics. -gently hydrate with IV fluids  follow BUN and creatinine and urine output.  -Creatinine was 1.6 in January 2018, at the time of admission 2.5--2.3 Hold her hydrochlorothiazide.   #Hypokalemia and hypomagnesemia replete and recheck   #Generalized weakness/tremor/falls- secondary to dehydration and other unclear etiology   Depakote level is  normal physical therapy consult to assess her mobility.  3. Thrombocytopenia-this is new. -No acute bleeding.  platelet count 94,000  4. History of mental retardation/bipolar disorder-continue Depakote, Seroquel. -Normal Depakote level.  5. Essential hypertension- hold hydrochlorothiazide given her acute kidney injury.  Follow blood pressure and if needed will add some IV when necessary meds      All the records are reviewed and case discussed with Care Management/Social Workerr. Management plans discussed with the patient, family and they are in agreement.  CODE STATUS: fc, brother and sister-in-law healthcare power of attorney  TOTAL TIME TAKING CARE OF THIS PATIENT: 36 minutes.   POSSIBLE D/C IN 2  DAYS, DEPENDING ON CLINICAL CONDITION.  Note: This dictation was prepared with Dragon dictation along with smaller phrase technology. Any transcriptional errors that result from this process are unintentional.   Ramonita LabAruna Damiah Mcdonald M.D on 09/26/2017 at 8:01 PM  Between 7am to 6pm - Pager - [phone] After 6pm go to [email or web address] - password [email or web address]  [facility]  Office  [phone]  CC: Primary care physician; Patient, No Pcp Per

## 2017-09-27 LAB — BASIC METABOLIC PANEL
Anion gap: 4 — ABNORMAL LOW (ref 5–15)
BUN: 29 mg/dL — ABNORMAL HIGH (ref 6–20)
CO2: 27 mmol/L (ref 22–32)
CREATININE: 2.19 mg/dL — AB (ref 0.44–1.00)
Calcium: 8.8 mg/dL — ABNORMAL LOW (ref 8.9–10.3)
Chloride: 106 mmol/L (ref 101–111)
GFR calc non Af Amer: 24 mL/min — ABNORMAL LOW (ref >60)
GFR, EST AFRICAN AMERICAN: 28 mL/min — AB (ref >60)
Glucose, Bld: 101 mg/dL — ABNORMAL HIGH (ref 65–99)
Potassium: 4.7 mmol/L (ref 3.5–5.1)
Sodium: 137 mmol/L (ref 135–145)

## 2017-09-27 LAB — MAGNESIUM: Magnesium: 2.1 mg/dL (ref 1.7–2.4)

## 2017-09-27 LAB — HIV ANTIBODY (ROUTINE TESTING W REFLEX): HIV Screen 4th Generation wRfx: NONREACTIVE

## 2017-09-27 MED ORDER — DIVALPROEX SODIUM ER 500 MG PO TB24
750.0000 mg | ORAL_TABLET | Freq: Every day | ORAL | Status: DC
  Administered 2017-09-27 – 2017-09-28 (×2): 750 mg via ORAL
  Filled 2017-09-27 (×3): qty 1

## 2017-09-27 NOTE — Progress Notes (Signed)
[facility] - [place] at [facility]   PATIENT NAME: Jenna Riggs    MR#:  [id number]  DATE OF BIRTH:  [birth year]  SUBJECTIVE:  CHIEF COMPLAINT: Patient is a poor historian, has mental retardation  REVIEW OF SYSTEMS:  CONSTITUTIONAL: Reporting generalized weakness.  EYES: No blurred or double vision.  EARS, NOSE, AND THROAT: No tinnitus or ear pain.  RESPIRATORY: No cough, shortness of breath, wheezing or hemoptysis.  CARDIOVASCULAR: No chest pain, orthopnea, edema.  GASTROINTESTINAL: No nausea, vomiting, diarrhea or abdominal pain.  HEMATOLOGY: No anemia, easy bruising or bleeding SKIN: No rash or lesion. MUSCULOSKELETAL: No joint pain or arthritis.   NEUROLOGIC: No tingling, numbness, weakness.  PSYCHIATRY: No anxiety or depression.   DRUG ALLERGIES:  No Known Allergies  VITALS:  Blood pressure 106/60, pulse 90, temperature 98.5 F (36.9 C), temperature source Oral, resp. rate 16, height 5\' 3"  (1.6 m), weight 100.6 kg (221 lb 12.5 oz), SpO2 96 %.  PHYSICAL EXAMINATION:  GENERAL:  57 y.o.-year-old patient lying in the bed with no acute distress.  EYES: Pupils equal, round, reactive to light and accommodation. No scleral icterus. Extraocular muscles intact.  HEENT: Head atraumatic, normocephalic. Oropharynx and nasopharynx clear.  NECK:  Supple, no jugular venous distention. No thyroid enlargement, no tenderness.  LUNGS: Normal breath sounds bilaterally, no wheezing, rales,rhonchi or crepitation. No use of accessory muscles of respiration.  CARDIOVASCULAR: S1, S2 normal. No murmurs, rubs, or gallops.  ABDOMEN: Soft, nontender, nondistended. Bowel sounds present. No organomegaly or mass.  EXTREMITIES: No pedal edema, cyanosis, or clubbing.  NEUROLOGIC: Cranial nerves II through XII are intact. Sensation intact. Gait not checked.  PSYCHIATRIC: The patient is alert and oriented x 3.  SKIN: No obvious rash, lesion, or ulcer.    LABORATORY PANEL:    CBC Recent Labs  Lab 09/26/17 0635  WBC 4.6  HGB 12.2  HCT 36.5  PLT 94*   ------------------------------------------------------------------------------------------------------------------  Chemistries  Recent Labs  Lab 09/27/17 0705  NA 137  K 4.7  CL 106  CO2 27  GLUCOSE 101*  BUN 29*  CREATININE 2.19*  CALCIUM 8.8*  MG 2.1   ------------------------------------------------------------------------------------------------------------------  Cardiac Enzymes Recent Labs  Lab 09/25/17 1124  TROPONINI <0.03   ------------------------------------------------------------------------------------------------------------------  RADIOLOGY:  No results found.  EKG:   Orders placed or performed during the hospital encounter of 09/25/17  . ED EKG  . ED EKG    ASSESSMENT AND PLAN:    57 year old female with past medical history of bipolar disorder, mental retardation, essential hypertension who presents to the hospital due to fall, weakness and noted to be in acute kidney injury.   #. Acute renal failure-secondary to dehydration and also use of diuretics. -gently hydrate with IV fluids, slow improvement of renal function  follow BUN and creatinine and urine output.  -Creatinine was 1.6 in January 2018, at the time of admission 2.5--2.3--2.19 Hold her hydrochlorothiazide.   #Hypokalemia and hypomagnesemia replete and recheck   #Generalized weakness/tremor/falls- secondary to dehydration and other unclear etiology   Depakote level is normal physical therapy consult to assess her mobility.  3. Thrombocytopenia-this is new. -No acute bleeding.  platelet count 94,000 Check CBC tomorrow  4. History of mental retardation/bipolar disorder-continue Depakote, Seroquel. -Normal Depakote level.  5. Essential hypertension- hold hydrochlorothiazide given her acute kidney injury.  Follow blood pressure and if needed will add some IV when necessary  meds    Physical therapy is recommending skilled nursing facility.  Social worker is following up  All the records are reviewed and case discussed with Care Management/Social Workerr. Management plans discussed with the patient, family and they are in agreement.  CODE STATUS: fc, brother and sister-in-law healthcare power of attorney  TOTAL TIME TAKING CARE OF THIS PATIENT: 36 minutes.   POSSIBLE D/C IN 2  DAYS, DEPENDING ON CLINICAL CONDITION.  Note: This dictation was prepared with Dragon dictation along with smaller phrase technology. Any transcriptional errors that result from this process are unintentional.   Ramonita Lab M.D on 09/27/2017 at 4:50 PM  Between 7am to 6pm - Pager - [phone] After 6pm go to [email or web address] - password [email or web address]  [facility]  Office  [phone]  CC: Primary care physician; Patient, No Pcp Per

## 2017-09-27 NOTE — Plan of Care (Signed)
Patient progressing. IVF still infusing for AKI, Seen by Physical Therapy today- up to chair, eating and drinking well.

## 2017-09-27 NOTE — Evaluation (Signed)
Physical Therapy Evaluation Patient Details Name: Jenna Riggs MRN: [id number] DOB: 01-04-1961 Today's Date: 09/27/2017   History of Present Illness  Pt is a56 y.o.femalewith a known history of mental retardation, essential hypertension, bipolar disorder who presents to the hospital due to weakness, fall.  Patient apparently had a fall a few days back and has been having significant weakness since then. She has also felt somewhat tremulous and lightheaded and dizzy when attempting to ambulate. She's been having some pain in that right foot since the fall. She came to the ER underwent x-rays of her right foot which showed no acute fracture but some postoperative changes, a CT head is negative for acute pathology. She was noted to be in acute kidney injury and hospitalist services were contacted further treatment and evaluation.  Assessment includes:  acute renal failure secondary to dehydration, hypokalemia, generalized weakness/falls, thrombocytopenia, and HTN.    Clinical Impression  Pt presents with deficits in strength, transfers, mobility, gait, balance, and activity tolerance.  Pt pleasant and motivated and able to follow commands well but with limited carryover for sequencing.  Pt limited by pain in the R ankle with any RLE movement but willing to attempt all functional mobility tasks.  Pt required SBA and cues for sequencing with bed mobility and CGA with cues during transfers.  Pt was guarded in standing and unwilling to bear weight through the R ankle.  Pt able to take several small hop-to steps on the LLE but with poor foot clearance that was close to shuffle steps.  Pt is at a high fall risk and would benefit from time in a SNF setting upon discharge to address the above deficits for eventual return to PLOF.       Follow Up Recommendations SNF    Equipment Recommendations  Rolling walker with 5" wheels(TBD at next venue of care if pt able to d/c to a SNF)    Recommendations for Other  Services       Precautions / Restrictions Precautions Precautions: Fall Restrictions Weight Bearing Restrictions: No      Mobility  Bed Mobility Overal bed mobility: Needs Assistance Bed Mobility: Supine to Sit;Sit to Supine     Supine to sit: Supervision Sit to supine: Supervision   General bed mobility comments: Effortful with bed mobility tasks with verbal cues for sequencing but no physical assistance required  Transfers Overall transfer level: Needs assistance Equipment used: Rolling walker (2 wheeled) Transfers: Sit to/from Stand Sit to Stand: Min guard         General transfer comment: Mod verbal cues for hand placement with pt unable to stand without BUE assist  Ambulation/Gait Ambulation/Gait assistance: Min guard Ambulation Distance (Feet): 2 Feet Assistive device: Rolling walker (2 wheeled)     Gait velocity interpretation: <1.8 ft/sec, indicative of risk for recurrent falls General Gait Details: LLE hop-to gait with very poor clearance of L foot from the floor, close to a shuffle, with pt unable to bear weight through R ankle   Stairs Stairs: (Unable/unsafe to attempt)          Wheelchair Mobility    Modified Rankin (Stroke Patients Only)       Balance Overall balance assessment: Needs assistance Sitting-balance support: Feet supported;No upper extremity supported Sitting balance-Leahy Scale: Good     Standing balance support: Bilateral upper extremity supported Standing balance-Leahy Scale: Fair  Pertinent Vitals/Pain Pain Assessment: No/denies pain    Home Living Family/patient expects to be discharged to:: Private residence Living Arrangements: Other relatives(Pt lives with brother, sister-in-law, and common-law spouse) Available Help at Discharge: Family;Available 24 hours/day(Sister-in-law home during the day to assist as needed) Type of Home: House Home Access: Stairs to  enter Entrance Stairs-Rails: Right;Left;Can reach both Entrance Stairs-Number of Steps: 1 Home Layout: One level Home Equipment: None      Prior Function Level of Independence: Independent         Comments: Pt Ind with amb without AD with no other falls in the last year other than current fall, Ind with ADLs     Hand Dominance   Dominant Hand: Left    Extremity/Trunk Assessment   Upper Extremity Assessment Upper Extremity Assessment: Overall WFL for tasks assessed    Lower Extremity Assessment Lower Extremity Assessment: Generalized weakness;RLE deficits/detail RLE Deficits / Details: Difficult to assess RLE strength secondary to ankle pain with any movement to RLE RLE: Unable to fully assess due to pain LLE Deficits / Details: LLE strength grossly WFL       Communication   Communication: No difficulties  Cognition Arousal/Alertness: Awake/alert Behavior During Therapy: WFL for tasks assessed/performed Overall Cognitive Status: No family/caregiver present to determine baseline cognitive functioning                                 General Comments: Pt required extra time to process/answer questions at times.  Pt able to follow commands well during session.       General Comments      Exercises Total Joint Exercises Ankle Circles/Pumps: AROM;Both;5 reps;10 reps(Limited on R) Quad Sets: Strengthening;Both;5 reps;10 reps Gluteal Sets: Strengthening;Both;10 reps Hip ABduction/ADduction: AROM;AAROM;Both;5 reps(RLE AAROM ) Straight Leg Raises: AROM;AAROM;Both;5 reps(RLE AAROM ) Long Arc Quad: AROM;Both;10 reps(Guarded limited movement on the R secondary to ankle pain per pt) Knee Flexion: AROM;Both;10 reps(Guarded limited movement on the R secondary to ankle pain per pt)   Assessment/Plan    PT Assessment Patient needs continued PT services  PT Problem List Decreased strength;Decreased activity tolerance;Decreased balance;Decreased mobility        PT Treatment Interventions DME instruction;Gait training;Stair training;Functional mobility training;Balance training;Therapeutic exercise;Therapeutic activities;Patient/family education    PT Goals (Current goals can be found in the Care Plan section)  Acute Rehab PT Goals Patient Stated Goal: To be able to walk better in the community PT Goal Formulation: With patient Time For Goal Achievement: 10/10/17 Potential to Achieve Goals: Good    Frequency Min 2X/week   Barriers to discharge Inaccessible home environment      Co-evaluation               AM-PAC PT "6 Clicks" Daily Activity  Outcome Measure Difficulty turning over in bed (including adjusting bedclothes, sheets and blankets)?: A Little Difficulty moving from lying on back to sitting on the side of the bed? : A Little Difficulty sitting down on and standing up from a chair with arms (e.g., wheelchair, bedside commode, etc,.)?: Unable Help needed moving to and from a bed to chair (including a wheelchair)?: A Little Help needed walking in hospital room?: Total Help needed climbing 3-5 steps with a railing? : Total 6 Click Score: 12    End of Session Equipment Utilized During Treatment: Gait belt Activity Tolerance: Patient limited by pain Patient left: in chair;with chair alarm set;with call bell/phone within reach Nurse Communication: Mobility  status PT Visit Diagnosis: Difficulty in walking, not elsewhere classified (R26.2);Muscle weakness (generalized) (M62.81)    Time: 1015-1050 PT Time Calculation (min) (ACUTE ONLY): 35 min   Charges:   PT Evaluation $PT Eval Low Complexity: 1 Low PT Treatments $Therapeutic Exercise: 8-22 mins   PT G Codes:        Elly Modena. Scott Arley Garant PT, DPT 09/27/17, 11:23 AM

## 2017-09-27 NOTE — Progress Notes (Signed)
LCSW has attempted to meet with patient who can only give breif history related to needs and current plans for discharge. She reports she lives with her brother and his wife who are her legal guardians. Paperwork supports guardianship which can be reviewed in media tab. 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  Patient reports she does not work nor go to a day treatment program. Call placed to daughter in law/guardian: Jenna Riggs [phone].  Not able to reach family and unable to leave voice message due to no voice mail.  Consult is for discharge planning with SNF being recommended.  Will need assistance from guardians and understanding of patient's baseline.  Appears last admission was psych related and [facility]. She discharged home with outpatient follow up.   LCSW will continue to make efforts to speak with family.  Jenna EmoryHannah Catrena Vari LCSW, MSW Clinical Social Work: Optician, dispensingystem Wide Float Coverage for :  [phone]

## 2017-09-27 NOTE — Consult Note (Signed)
PHARMACY CONSULT NOTE - INITIAL   Pharmacy Consult for Electrolyte Monitoring and Replacement   No Known Allergies  Patient Measurements: Height: 5\' 3"  (160 cm) Weight: 221 lb 12.5 oz (100.6 kg) IBW/kg (Calculated) : 52.4  Vital Signs: Temp: 97.8 F (36.6 C) (01/30 1000) Temp Source: Oral (01/30 1000) BP: 139/64 (01/30 1000) Pulse Rate: 68 (01/30 1056) Intake/Output from previous day: 01/29 0701 - 01/30 0700 In: 2748.3 [P.O.:600; I.V.:2098.3; IV Piggyback:50] Out: 700 [Urine:700] Intake/Output from this shift: Total I/O In: 480 [P.O.:480] Out: -   Labs: Recent Labs    09/25/17 1124 09/26/17 0635 09/27/17 0705  WBC 10.8 4.6  --   HGB 14.2 12.2  --   HCT 42.3 36.5  --   PLT 98* 94*  --   CREATININE 2.50* 2.33* 2.19*  MG  --  1.7 2.1   Potassium (mmol/L)  Date Value  09/27/2017 4.7  03/24/2014 4.0   Magnesium (mg/dL)  Date Value  16/10/960401/30/2019 2.1   Calcium (mg/dL)  Date Value  54/09/811901/30/2019 8.8 (L)   Calcium, Total (mg/dL)  Date Value  14/78/295607/27/2015 8.5   Albumin (g/dL)  Date Value  21/30/865701/25/2018 3.7  03/24/2014 3.1 (L)  ] Estimated Creatinine Clearance: 32.5 mL/min (A) (by C-G formula based on SCr of 2.19 mg/dL (H)).  Assessment: Pharmacy consulted for electrolyte monitoring and replacement in 57 yo female admitted with acute renal failure secondary to dehydration.   Goal of Therapy:  Electrolytes WNL  Plan:  Electrolytes WNL today. No replacement needed at this time. Will recheck electrolytes with AM labs and continue to replace as needed.   Gardner CandleSheema M Elishua Radford, PharmD, BCPS Clinical Pharmacist 09/27/2017 10:59 AM

## 2017-09-28 ENCOUNTER — Inpatient Hospital Stay: Payer: Medicare Other

## 2017-09-28 LAB — CBC
HCT: 33.2 % — ABNORMAL LOW (ref 35.0–47.0)
Hemoglobin: 10.9 g/dL — ABNORMAL LOW (ref 12.0–16.0)
MCH: 31.7 pg (ref 26.0–34.0)
MCHC: 32.8 g/dL (ref 32.0–36.0)
MCV: 96.9 fL (ref 80.0–100.0)
PLATELETS: 89 10*3/uL — AB (ref 150–440)
RBC: 3.42 MIL/uL — ABNORMAL LOW (ref 3.80–5.20)
RDW: 13.4 % (ref 11.5–14.5)
WBC: 4.1 10*3/uL (ref 3.6–11.0)

## 2017-09-28 LAB — BASIC METABOLIC PANEL
Anion gap: 5 (ref 5–15)
BUN: 30 mg/dL — ABNORMAL HIGH (ref 6–20)
CALCIUM: 8.8 mg/dL — AB (ref 8.9–10.3)
CO2: 28 mmol/L (ref 22–32)
Chloride: 108 mmol/L (ref 101–111)
Creatinine, Ser: 2.33 mg/dL — ABNORMAL HIGH (ref 0.44–1.00)
GFR calc non Af Amer: 22 mL/min — ABNORMAL LOW (ref >60)
GFR, EST AFRICAN AMERICAN: 26 mL/min — AB (ref >60)
Glucose, Bld: 100 mg/dL — ABNORMAL HIGH (ref 65–99)
Potassium: 4.3 mmol/L (ref 3.5–5.1)
Sodium: 141 mmol/L (ref 135–145)

## 2017-09-28 LAB — MAGNESIUM: Magnesium: 1.8 mg/dL (ref 1.7–2.4)

## 2017-09-28 MED ORDER — SODIUM CHLORIDE 0.9 % IV BOLUS (SEPSIS)
1000.0000 mL | Freq: Once | INTRAVENOUS | Status: AC
  Administered 2017-09-28: 11:00:00 1000 mL via INTRAVENOUS

## 2017-09-28 MED ORDER — MAGNESIUM SULFATE IN D5W 1-5 GM/100ML-% IV SOLN
1.0000 g | Freq: Once | INTRAVENOUS | Status: AC
  Administered 2017-09-28: 1 g via INTRAVENOUS
  Filled 2017-09-28: qty 100

## 2017-09-28 NOTE — Progress Notes (Signed)
LCSW continues to try and reach family with numbers listed in chart. 2nd attempt to call Lupita LeashDonna  [phone] with no ability to leave message.   Still pending disposition as LCSW has been unable to reach any family.  Deretha EmoryHannah Kadin Bera LCSW, MSW Clinical Social Work: Optician, dispensingystem Wide Float Coverage for :  [phone]

## 2017-09-28 NOTE — Progress Notes (Addendum)
LCSW was able to meet with patient's guardian at bedside. Jenna Riggs reports [place] way [place] and getting to the hospital is difficult due to limited funds for gas, however she is available by phone.  LCSW explained role and reason for consult.  Discussed SNF recommendation which Jenna Riggs is familiar with as patient had a bad fall a few years back and had to complete rehab. At this time she is not wanting patient to go to SNF. She feels patient will be fine at home as she offers 24 hour supervision amongst the other family members and they will be able to assist patient. She does request home health for PT and if patient can have an assisted device such as boot to help support her ankle and assist with walking.  She mentions also crutches or a walker.  Jenna Riggs reports she was a former CNA and feels comfortable taking patient home.  She reports patient will be more comfortable in her own home vs a nursing home, thus not interested in SNF.  No other needs voices. Discussed with RN and MD.  CSW will sign off at this time. Anticipate DC home.  Jenna EmoryHannah Ani Deoliveira LCSW, MSW Clinical Social Work: Optician, dispensingystem Wide Float Coverage for :  [phone]

## 2017-09-28 NOTE — Progress Notes (Signed)
[facility]  ROUNDING NOTE   Subjective:   Ms. Jenna AblesLinda Riggs admitted to [facility] on 09/25/2017 for Dehydration [E86.0] Acute renal insufficiency [N28.9] Generalized weakness [R53.1]   Patient well known to our practice, follows with Dr. Cherylann RatelLateef, for chronic kidney disease stage IV.   Patient admitted with creatinine of 2.5 after taking hydrochlorothiazide and poor PO intake. Patient fell and had trauma to her right ankle.   Started on IV fluids.   Objective:  Vital signs in last 24 hours:  Temp:  [97.7 F (36.5 C)] 97.7 F (36.5 C) (01/31 0354) Pulse Rate:  [58] 58 (01/31 0354) Resp:  [15] 15 (01/31 0354) BP: (135)/(63) 135/63 (01/31 0354) SpO2:  [96 %] 96 % (01/31 0354)  Weight change:  Filed Weights   09/25/17 1117 09/25/17 2052  Weight: 117.9 kg (260 lb) 100.6 kg (221 lb 12.5 oz)    Intake/Output: I/O last 3 completed shifts: In: 5359.3 [P.O.:1320; I.V.:4039.3] Out: 500 [Urine:500]   Intake/Output this shift:  Total I/O In: 120 [P.O.:120] Out: -   Physical Exam: General: NAD, sitting in chair  Head: Normocephalic, atraumatic. Moist oral mucosal membranes  Eyes: Anicteric, PERRL  Neck: Supple, trachea midline  Lungs:  Clear to auscultation  Heart: Regular rate and rhythm  Abdomen:  Soft, nontender,   Extremities:  1+ peripheral edema.  Neurologic: Nonfocal, moving all four extremities  Skin: No lesions       Basic Metabolic Panel: Recent Labs  Lab 09/25/17 1124 09/26/17 0635 09/27/17 0705 09/28/17 0505  NA 135 137 137 141  K 3.5 2.8* 4.7 4.3  CL 93* 100* 106 108  CO2 29 29 27 28   GLUCOSE 153* 148* 101* 100*  BUN 39* 35* 29* 30*  CREATININE 2.50* 2.33* 2.19* 2.33*  CALCIUM 10.1 8.9 8.8* 8.8*  MG  --  1.7 2.1 1.8    Liver Function Tests: No results for input(s): AST, ALT, ALKPHOS, BILITOT, PROT, ALBUMIN in the last 168 hours. No results for input(s): LIPASE, AMYLASE in the last 168 hours. No results for input(s): AMMONIA in the last  168 hours.  CBC: Recent Labs  Lab 09/25/17 1124 09/26/17 0635 09/28/17 0505  WBC 10.8 4.6 4.1  HGB 14.2 12.2 10.9*  HCT 42.3 36.5 33.2*  MCV 95.0 96.1 96.9  PLT 98* 94* 89*    Cardiac Enzymes: Recent Labs  Lab 09/25/17 1124  TROPONINI <0.03    BNP: Invalid input(s): POCBNP  CBG: No results for input(s): GLUCAP in the last 168 hours.  Microbiology: No results found for this or any previous visit.  Coagulation Studies: No results for input(s): LABPROT, INR in the last 72 hours.  Urinalysis: No results for input(s): COLORURINE, LABSPEC, PHURINE, GLUCOSEU, HGBUR, BILIRUBINUR, KETONESUR, PROTEINUR, UROBILINOGEN, NITRITE, LEUKOCYTESUR in the last 72 hours.  Invalid input(s): APPERANCEUR    Imaging: [place] Renal  Result Date: 09/28/2017 CLINICAL DATA:  Acute kidney injury. EXAM: RENAL / URINARY TRACT ULTRASOUND COMPLETE COMPARISON:  None. FINDINGS: Right Kidney: Length: 16.2 cm. Numerous anechoic cysts throughout the kidney measuring to at least 4.3 cm with increased through transmission. No hydronephrosis. Left Kidney: Length: 15.2 cm. Numerous anechoic cysts throughout the kidney measuring to at least 5.5 cm with increased through transmission. No hydronephrosis. Bladder: Appears normal for degree of bladder distention. IMPRESSION: Numerous bilateral renal cysts most consistent with polycystic kidney disease. No obstructive uropathy. Electronically Signed   By: Awilda Metroourtnay  Bloomer M.D.   On: 09/28/2017 19:28     Medications:   . sodium chloride  100 mL/hr at 09/28/17 1226   . aspirin EC  81 mg Oral Daily  . divalproex  750 mg Oral q1800  . divalproex  500 mg Oral q morning - 10a  . heparin  5,000 Units Subcutaneous Q8H  . QUEtiapine  150 mg Oral BID  . traZODone  300 mg Oral QHS  . cyanocobalamin  1,000 mcg Oral Daily   acetaminophen **OR** acetaminophen, ondansetron **OR** ondansetron (ZOFRAN) IV  Assessment/ Plan:  [name] is a 57 y.o. Hispanic female with  chronic kidney disease stage IV, hypertension, bipolar disorder, tardive dyskinesia, hypertension, hyperlipidemia  1. Acute renal failure on chronic kidney disease stage IV: baseline creatinine of 2, GFR of 28. Chronic kidney disease secondary to polycystic kidney disease, hypertension and questionable history of lithium exposure.  Acute renal failure secondary to prerenal azotemia. Creatinine improving with IV fluids and holding hydrochlorothiazide.  - Check renal ultrasound - Continue IV fluids  2. Hypertension: blood pressure at goal. Currently holding hydrochlorothiazide.   3. Secondary Hyperparathyroidism: Hypercalcemia on admission - most likely secondary to renal failure and hydrochlorothiazide.  will need follow up PTH, and phosphorus.  - Calcium now at goal. Improved with IV fluids.    LOS: 2 Jenna Riggs 1/31/20198:24 PM

## 2017-09-28 NOTE — Consult Note (Signed)
PHARMACY CONSULT NOTE - INITIAL   Pharmacy Consult for Electrolyte Monitoring and Replacement   No Known Allergies  Patient Measurements: Height: 5\' 3"  (160 cm) Weight: 221 lb 12.5 oz (100.6 kg) IBW/kg (Calculated) : 52.4  Vital Signs: Temp: 97.7 F (36.5 C) (01/31 0354) BP: 135/63 (01/31 0354) Pulse Rate: 58 (01/31 0354) Intake/Output from previous day: 01/30 0701 - 01/31 0700 In: 2940.3 [P.O.:960; I.V.:1980.3] Out: 500 [Urine:500] Intake/Output from this shift: No intake/output data recorded.  Labs: Recent Labs    09/25/17 1124 09/26/17 0635 09/27/17 0705 09/28/17 0505  WBC 10.8 4.6  --  4.1  HGB 14.2 12.2  --  10.9*  HCT 42.3 36.5  --  33.2*  PLT 98* 94*  --  89*  CREATININE 2.50* 2.33* 2.19* 2.33*  MG  --  1.7 2.1 1.8   Potassium (mmol/L)  Date Value  09/28/2017 4.3  03/24/2014 4.0   Magnesium (mg/dL)  Date Value  40/98/119101/31/2019 1.8   Calcium (mg/dL)  Date Value  47/82/956201/31/2019 8.8 (L)   Calcium, Total (mg/dL)  Date Value  13/08/657807/27/2015 8.5   Albumin (g/dL)  Date Value  46/96/295201/25/2018 3.7  03/24/2014 3.1 (L)  ] Estimated Creatinine Clearance: 30.5 mL/min (A) (by C-G formula based on SCr of 2.33 mg/dL (H)).  Assessment: Pharmacy consulted for electrolyte monitoring and replacement in 57 yo female admitted with acute renal failure secondary to dehydration.   Goal of Therapy:  Electrolytes WNL  Plan:  Magnesium at the low end of normal. Will replacemwnt magnesium 1g IV x 1. All other Electrolytes WNL today.  No additional replacement needed at this time. Will recheck electrolytes with AM labs and continue to replace as needed. If electrolytes WNL- will sign off.   Gardner CandleSheema M Tamikka Pilger, PharmD, BCPS Clinical Pharmacist 09/28/2017 10:28 AM

## 2017-09-28 NOTE — Plan of Care (Signed)
  Progressing Health Behavior/Discharge Planning: Ability to manage health-related needs will improve 09/28/2017 1948 - Progressing by Luretha MurphyMiles, Ethelwyn Gilbertson, RN Ability to manage health-related needs will improve 09/28/2017 1948 - Progressing by Luretha MurphyMiles, Marry Kusch, RN Education: Knowledge of General Education information will improve 09/28/2017 1948 - Progressing by Luretha MurphyMiles, Celicia Minahan, RN Clinical Measurements: Ability to maintain clinical measurements within normal limits will improve 09/28/2017 1948 - Progressing by Luretha MurphyMiles, Maciel Kegg, RN

## 2017-09-28 NOTE — Clinical Social Work Note (Signed)
Clinical Social Work Assessment  Patient Details  Name: Jenna Riggs MRN: [id number] Date of Birth: [birth year]  Date of referral:  09/28/17               Reason for consult:  Facility Placement, Discharge Planning                Permission sought to share information with:  Case Manager, Family Supports Permission granted to share information::  Yes, Verbal Permission Granted  Name::        Agency::     Relationship::  Jenna Riggs: sister in Engineer, civil (consulting) Information:     Housing/Transportation Living arrangements for the past 2 months:  Single Family Home Source of Information:  Patient, Medical Team Patient Interpreter Needed:  None Criminal Activity/Legal Involvement Pertinent to Current Situation/Hospitalization:  No - Comment as needed Significant Relationships:  Other Family Members, Siblings Lives with:  Significant Other, Siblings Do you feel safe going back to the place where you live?  Yes Need for family participation in patient care:  Yes (Comment)  Care giving concerns:  Patient admitted after a fall from home when she reports she became dizzy in the kitchen.  Patient reports she hurt her ankle.  She reports she lives with brother and his wife and 9 other people including a common law husband of 23 years. Patient reports her family takes good care of her by taking her to appointments, giving her medications, and meals.  Reports the house is crowded, but she likes helping around the house with cleaning and playing with the children.  She reports her mother and father were diabetics and have passed away. Patient reports she has worked with therapy and wants to try and walk today as her baseline is independence. She voices that the therapist said she might need to go to rehab, but voices she does not think she would like that and would like help at home or going to outpatient.  Multiple calls have been placed to patient's guardian: Jenna Riggs. Patient has even tried calling in the room  with no answer or ability to leave a message.  Guardians are necessary at this time to help with decision making process.    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    LCSW continues to try and reach family for patient.  Call placed in room and LCSW searched patient's belongings for other phone numbers or identification in effort to reach family.  Patient had a hand written piece of paper in her shoe with the same phone number listed in chart.  Plan: TBD.  If no call or visit from family (per patient no one has come to visit) LCSW may have to send non-emergency police to home in effort to check on status of the patient's family and involve them in care as patient cannot make her own decisions.   Employment status:  Disabled (Comment on whether or not currently receiving Disability) Insurance information:  Medicare, Medicaid In [place] PT Recommendations:  Skilled Nursing Facility Information / Referral to community resources:  Skilled Nursing Facility  Patient/Family's Response to care:  Patient appreciative of visit and aware that we are working to reach her family  Patient/Family's Understanding of and Emotional Response to Diagnosis, Current Treatment, and Prognosis:  Patient aware of her reason  for admission and able to process her fall at home and current injuries: ankle.  She is unaware of any other medical conditions being treated at this time.  Emotional Assessment Appearance:  Appears stated age Attitude/Demeanor/Rapport:  Engaged Affect (typically observed):  Accepting, Adaptable Orientation:  Oriented to Self, Oriented to Place, Oriented to  Time, Oriented to Situation Alcohol / Substance use:  Not Applicable Psych involvement (Current and /or in the community):  Yes (Comment), Outpatient  Provider  Discharge Needs  Concerns to be addressed:  Decision making concerns, Discharge Planning Concerns(UNable to reach family) Readmission within the last 30 days:  No Current discharge risk:  None Barriers to Discharge:  Family Issues, English as a second language teachernsurance Authorization, Continued Medical Work up   Raye SorrowCoble, Carman Auxier N, LCSW 09/28/2017, 10:45 AM

## 2017-09-28 NOTE — Progress Notes (Signed)
Physical Therapy Treatment Patient Details Name: Jenna AblesLinda Dolloff MRN: [id number] DOB: September 04, 1960 Today's Date: 09/28/2017    History of Present Illness Pt is a56 y.o.femalewith a known history of mental retardation, essential hypertension, bipolar disorder who presents to the hospital due to weakness, fall.  Patient apparently had a fall a few days back and has been having significant weakness since then. She has also felt somewhat tremulous and lightheaded and dizzy when attempting to ambulate. She's been having some pain in that right foot since the fall. She came to the ER underwent x-rays of her right foot which showed no acute fracture but some postoperative changes, a CT head is negative for acute pathology. She was noted to be in acute kidney injury and hospitalist services were contacted further treatment and evaluation.  Assessment includes:  acute renal failure secondary to dehydration, hypokalemia, generalized weakness/falls, thrombocytopenia, and HTN.    PT Comments    Pt presents with deficits in strength, transfers, mobility, gait, balance, and activity tolerance.  Pt with edema to R ankle with grossly increased bruising to medial and lateral ankle compared to previous session.  Pt unable to tolerate even gentle R ankle palpation/mobilization attempts secondary to pain.  Pt with decreased tolerance to weight bearing through R ankle this session.  During standing weight shifting activities pt was only able to tolerate gentle toe touch WB on the RLE before shifting back over to NWB on the R.  Pt unable to amb this session secondary to pain.  Pt will benefit from PT services in a SNF setting upon discharge to safely address above deficits for decreased caregiver assistance and eventual return to PLOF.        Follow Up Recommendations  SNF     Equipment Recommendations  Rolling walker with 5" wheels    Recommendations for Other Services       Precautions / Restrictions  Precautions Precautions: Fall Restrictions Weight Bearing Restrictions: No    Mobility  Bed Mobility               General bed mobility comments: NT this session, pt in recliner  Transfers Overall transfer level: Needs assistance Equipment used: Rolling walker (2 wheeled) Transfers: Sit to/from Stand Sit to Stand: Min guard         General transfer comment: Mod verbal cues for hand placement with pt unable to stand without BUE assist, poor carryover with sequencing  Ambulation/Gait             General Gait Details: Pt unable to amb this session secondary to ankle pain with WB   Stairs            Wheelchair Mobility    Modified Rankin (Stroke Patients Only)       Balance Overall balance assessment: Needs assistance Sitting-balance support: Feet supported;No upper extremity supported Sitting balance-Leahy Scale: Good     Standing balance support: Bilateral upper extremity supported Standing balance-Leahy Scale: Fair                              Cognition Arousal/Alertness: Awake/alert Behavior During Therapy: WFL for tasks assessed/performed Overall Cognitive Status: No family/caregiver present to determine baseline cognitive functioning                                        Exercises Total Joint Exercises Ankle  Circles/Pumps: AROM;Both;5 reps;10 reps(Gentle AROM on the R) Quad Sets: Strengthening;Both;5 reps;10 reps Gluteal Sets: Strengthening;Both;5 reps;10 reps Long Arc Quad: Strengthening;Both;10 reps;15 reps Knee Flexion: Strengthening;Both;10 reps;15 reps Marching in Standing: Seated;AROM;Both;5 reps;10 reps Other Exercises Other Exercises: Static standing x 3 with emphasis on gentle weight shifting toward RLE to tolerance    General Comments        Pertinent Vitals/Pain Pain Assessment: No/denies pain(No pain at rest but substantial pain with weight bearing)    Home Living                       Prior Function            PT Goals (current goals can now be found in the care plan section) Progress towards PT goals: Not progressing toward goals - comment(Limited by R ankle pain)    Frequency    Min 2X/week      PT Plan Current plan remains appropriate    Co-evaluation              AM-PAC PT "6 Clicks" Daily Activity  Outcome Measure                   End of Session Equipment Utilized During Treatment: Gait belt Activity Tolerance: Patient limited by pain Patient left: in chair;with call bell/phone within reach;with chair alarm set Nurse Communication: Mobility status PT Visit Diagnosis: Difficulty in walking, not elsewhere classified (R26.2);Muscle weakness (generalized) (M62.81)     Time: 1610-9604 PT Time Calculation (min) (ACUTE ONLY): 27 min  Charges:  $Therapeutic Exercise: 23-37 mins                    G Codes:       DElly Modena PT, DPT 09/28/17, 1:14 PM

## 2017-09-28 NOTE — Plan of Care (Signed)
VSS, free of falls during shift.  Denies pain.  No complaints overnight.  Bed in low position, bed alarm on, call bell within reach.  WCTM.

## 2017-09-28 NOTE — Care Management (Signed)
Patient admitted for AKI.  Notified by CSW that patient's gaudian has decided to return home with home health services.  RNCM spoke with patient's sister Lupita LeashDonna who is her Legal guardian. Lupita LeashDonna states that patient lives at [place].  [facility]P [facility].  Pharmacy [facility] in [place].  No medical equipment in the home.  Independent as baseline. Offered home health agency preference.  Lupita LeashDonna states that she does not have a preference of agency.  [facility].  Anticipate need for RN, PT, and RW.  Lupita LeashDonna asks if there is a need for a walking boot due to an ankle sprain.  RNCM to follow up with MD. [facility] for discharge planning

## 2017-09-28 NOTE — Progress Notes (Signed)
[facility] [place] [facility]   PATIENT NAME: Jenna AblesLinda Riggs    MR#:  [id number]  DATE OF BIRTH:  Jun 03, 1961  SUBJECTIVE:  CHIEF COMPLAINT: Patient is a poor historian, has mental retardation.  Brother and  sister-in-law at bedside  REVIEW OF SYSTEMS:  CONSTITUTIONAL: Reporting generalized weakness.  EYES: No blurred or double vision.  EARS, NOSE, AND THROAT: No tinnitus or ear pain.  RESPIRATORY: No cough, shortness of breath, wheezing or hemoptysis.  CARDIOVASCULAR: No chest pain, orthopnea, edema.  GASTROINTESTINAL: No nausea, vomiting, diarrhea or abdominal pain.  HEMATOLOGY: No anemia, easy bruising or bleeding SKIN: No rash or lesion. MUSCULOSKELETAL: No joint pain or arthritis.   NEUROLOGIC: No tingling, numbness, weakness.  PSYCHIATRY: No anxiety or depression.   DRUG ALLERGIES:  No Known Allergies  VITALS:  Blood pressure 135/63, pulse (!) 58, temperature 97.7 F (36.5 C), resp. rate 15, height 5\' 3"  (1.6 m), weight 100.6 kg (221 lb 12.5 oz), SpO2 96 %.  PHYSICAL EXAMINATION:  GENERAL:  57 y.o.-year-old patient lying in the bed with no acute distress.  EYES: Pupils equal, round, reactive to light and accommodation. No scleral icterus. Extraocular muscles intact.  HEENT: Head atraumatic, normocephalic. Oropharynx and nasopharynx clear.  NECK:  Supple, no jugular venous distention. No thyroid enlargement, no tenderness.  LUNGS: Normal breath sounds bilaterally, no wheezing, rales,rhonchi or crepitation. No use of accessory muscles of respiration.  CARDIOVASCULAR: S1, S2 normal. No murmurs, rubs, or gallops.  ABDOMEN: Soft, nontender, nondistended. Bowel sounds present. No organomegaly or mass.  EXTREMITIES: No pedal edema, cyanosis, or clubbing.  NEUROLOGIC: Cranial nerves II through XII are intact. Sensation intact. Gait not checked.  PSYCHIATRIC: The patient is alert and oriented x 3.  SKIN: No obvious rash, lesion, or ulcer.     LABORATORY PANEL:   CBC Recent Labs  Lab 09/28/17 0505  WBC 4.1  HGB 10.9*  HCT 33.2*  PLT 89*   ------------------------------------------------------------------------------------------------------------------  Chemistries  Recent Labs  Lab 09/28/17 0505  NA 141  K 4.3  CL 108  CO2 28  GLUCOSE 100*  BUN 30*  CREATININE 2.33*  CALCIUM 8.8*  MG 1.8   ------------------------------------------------------------------------------------------------------------------  Cardiac Enzymes Recent Labs  Lab 09/25/17 1124  TROPONINI <0.03   ------------------------------------------------------------------------------------------------------------------  RADIOLOGY:  [place] Renal  Result Date: 09/28/2017 CLINICAL DATA:  Acute kidney injury. EXAM: RENAL / URINARY TRACT ULTRASOUND COMPLETE COMPARISON:  None. FINDINGS: Right Kidney: Length: 16.2 cm. Numerous anechoic cysts throughout the kidney measuring to at least 4.3 cm with increased through transmission. No hydronephrosis. Left Kidney: Length: 15.2 cm. Numerous anechoic cysts throughout the kidney measuring to at least 5.5 cm with increased through transmission. No hydronephrosis. Bladder: Appears normal for degree of bladder distention. IMPRESSION: Numerous bilateral renal cysts most consistent with polycystic kidney disease. No obstructive uropathy. Electronically Signed   By: Awilda Metroourtnay  Bloomer M.D.   On: 09/28/2017 19:28    EKG:   Orders placed or performed during the hospital encounter of 09/25/17  . ED EKG  . ED EKG    ASSESSMENT AND PLAN:    57 year old female with past medical history of bipolar disorder, mental retardation, essential hypertension who presents to the hospital due to fall, weakness and noted to be in acute kidney injury.   #. Acute renal failure on chronic kidney disease stage IV-secondary to polycystic kidney disease, hypertension and questionable history of lithium exposure, dehydration and  also use of diuretics. -gently hydrate with IV fluids, slow improvement of renal  function  follow BUN and creatinine and urine output.  -Creatinine was 1.6 in January 2018, baseline creatinine 2, at the time of admission 2.5--2.3--2.19-2.33 Nephrology consulted, appreciate their recommendations Renal ultrasound-polycystic kidney disease with no obstructive uropathy Hold her hydrochlorothiazide.   #Hypokalemia and hypomagnesemia replete and recheck   #Generalized weakness/tremor/falls- secondary to dehydration and other unclear etiology   Depakote level is normal physical therapy consult to assess her mobility.  3. Thrombocytopenia-this is new. -No acute bleeding.  platelet count 94,000 Check CBC tomorrow  4. History of mental retardation/bipolar disorder-continue Depakote, Seroquel. -Normal Depakote level.  5. Essential hypertension- hold hydrochlorothiazide given her acute kidney injury.  Follow blood pressure and if needed will add some IV when necessary meds    Physical therapy is recommending skilled nursing facility.  Social worker is following up  All the records are reviewed and case discussed with Care Management/Social Workerr. Management plans discussed with the patient, brother and sister-in-law at bedside and they are in agreement.  CODE STATUS: fc, brother and sister-in-law healthcare power of attorney  TOTAL TIME TAKING CARE OF THIS PATIENT: 36 minutes.   POSSIBLE D/C IN 2  DAYS, DEPENDING ON CLINICAL CONDITION.  Note: This dictation was prepared with Dragon dictation along with smaller phrase technology. Any transcriptional errors that result from this process are unintentional.   Ramonita Lab M.D on 09/28/2017 at 8:44 PM  Between 7am to 6pm - Pager - [phone] After 6pm go to [email or web address] - password [email or web address] [facility]  [place]  [facility]ospital[facility]  Office  [phone]  CC: Primary care physician; Center, [facility]

## 2017-09-29 LAB — VALPROIC ACID LEVEL: Valproic Acid Lvl: 67 ug/mL (ref 50.0–100.0)

## 2017-09-29 LAB — BASIC METABOLIC PANEL
ANION GAP: 9 (ref 5–15)
BUN: 27 mg/dL — ABNORMAL HIGH (ref 6–20)
CALCIUM: 8.7 mg/dL — AB (ref 8.9–10.3)
CO2: 23 mmol/L (ref 22–32)
Chloride: 108 mmol/L (ref 101–111)
Creatinine, Ser: 1.92 mg/dL — ABNORMAL HIGH (ref 0.44–1.00)
GFR calc Af Amer: 33 mL/min — ABNORMAL LOW (ref >60)
GFR, EST NON AFRICAN AMERICAN: 28 mL/min — AB (ref >60)
GLUCOSE: 107 mg/dL — AB (ref 65–99)
POTASSIUM: 4.3 mmol/L (ref 3.5–5.1)
Sodium: 140 mmol/L (ref 135–145)

## 2017-09-29 MED ORDER — DIVALPROEX SODIUM ER 250 MG PO TB24: 750.0000 mg | ORAL_TABLET | Freq: Every day | ORAL | 0 refills | Status: DC

## 2017-09-29 MED ORDER — DIVALPROEX SODIUM 500 MG PO DR TAB: 500.0000 mg | DELAYED_RELEASE_TABLET | Freq: Every morning | ORAL | 0 refills | Status: DC

## 2017-09-29 NOTE — Discharge Summary (Signed)
[facility] [place] at [facility]   PATIENT NAME: Jenna AblesLinda Riggs    MR#:  [id number]  DATE OF BIRTH:  1960/11/02  DATE OF ADMISSION:  09/25/2017 ADMITTING PHYSICIAN: Houston SirenVivek J Sainani, MD  DATE OF DISCHARGE: 09/29/17 PRIMARY CARE PHYSICIAN: [facility], [facility]    ADMISSION DIAGNOSIS:  Dehydration [E86.0] Acute renal insufficiency [N28.9] Generalized weakness [R53.1]  DISCHARGE DIAGNOSIS:  Active Problems:   Acute renal failure (ARF) (HCC)   ARF (acute renal failure) (HCC)   SECONDARY DIAGNOSIS:   Past Medical History:  Diagnosis Date  . Mental retardation     HOSPITAL COURSE:   HPI  Jenna Riggs  is a 57 y.o. female with a known history of mental retardation, essential hypertension, bipolar disorder who presents to the hospital due to weakness, fall. Patient is a poor historian therefore most of the history obtained from the chart and from the ER physician. Patient apparently had a fall a few days back and has been having significant weakness since then. She has also felt somewhat tremulous and lightheaded and dizzy when attempting to ambulate. She's been having some pain in that right foot since the fall. She came to the ER underwent x-rays of her right foot which showed no acute fracture but some postoperative changes, a CT head is negative for acute pathology. She was noted to be in acute kidney injury and hospitalist services were contacted further treatment and evaluation.  #. Acute renal failure on chronic kidney disease stage IV-secondary to polycystic kidney disease, hypertension and questionable history of lithium exposure, dehydration and also use of diuretics. -gently hydrate with IV fluids, slow improvement of renal function  follow BUN and creatinine and urine output.  -Creatinine was 1.6 in January 2018, baseline creatinine 2, at the time of admission 2.5--2.3--2.19-2.33--1.92 Nephrology consulted, appreciate their recommendations.   Outpatient follow-up with Dr. Cherylann RatelLateef Renal ultrasound-polycystic kidney disease with no obstructive uropathy Hold her hydrochlorothiazide.   #Hypokalemia and hypomagnesemia replete and recheck   #Generalized weakness/tremor/falls- secondary to dehydration and other unclear etiology   Depakote level is normal physical therapy consult to assess her mobility.  3. Thrombocytopenia-this is probably Depakote induced -No acute bleeding.  platelet count 94,000-89,000 Check CBC during follow-up visit with the primary care physician if  no improvement PCP to consider refer to neurology versus hematology  4. History of mental retardation/bipolar disorder-continue Depakote, Seroquel. -Normal Depakote level.  5. Essential hypertension- hold hydrochlorothiazide given her acute kidney injury.  Follow blood pressure and if needed will add some IV when necessary meds    Physical therapy is recommending skilled nursing facility.    Patient refusing to go to skilled nursing facility she would rather go home.  Will discharge home with home health.      DISCHARGE CONDITIONS:   FAIR  CONSULTS OBTAINED:  Treatment Team:  Lamont DowdyKolluru, Sarath, MD   PROCEDURES NONE   DRUG ALLERGIES:  No Known Allergies  DISCHARGE MEDICATIONS:   Allergies as of 09/29/2017   No Known Allergies     Medication List    STOP taking these medications   hydrochlorothiazide 25 MG tablet Commonly known as:  HYDRODIURIL   valproic acid 250 MG capsule Commonly known as:  DEPAKENE     TAKE these medications   aspirin EC 81 MG tablet Take 81 mg by mouth daily.   cyanocobalamin 1000 MCG tablet Take 1 tablet (1,000 mcg total) by mouth daily.   divalproex 250 MG 24 hr tablet Commonly known as:  DEPAKOTE ER Take 3 tablets (750 mg total) by mouth daily at 6 PM. What changed:  You were already taking a medication with the same name, and this prescription was added. Make sure you understand how and when  to take each.   divalproex 500 MG DR tablet Commonly known as:  DEPAKOTE Take 1 tablet (500 mg total) by mouth every morning. Start taking on:  09/30/2017 What changed:    medication strength  how much to take  when to take this  additional instructions   QUEtiapine 100 MG tablet Commonly known as:  SEROQUEL Take 1 tablet (100 mg total) by mouth 2 (two) times daily. What changed:  how much to take   traZODone 100 MG tablet Commonly known as:  DESYREL Take 300 mg by mouth at bedtime. What changed:  Another medication with the same name was removed. Continue taking this medication, and follow the directions you see here.            Durable Medical Equipment  (From admission, onward)        Start     Ordered   09/29/17 1117  For home use only DME Walker rolling  Once    Comments:  Rolling walker with 5 inch wheels  Question:  Patient needs a walker to treat with the following condition  Answer:  Weakness   09/29/17 1117       DISCHARGE INSTRUCTIONS:   Follow-up with primary care physician in a week  follow-up with nephrology in a week    DIET:  Low-salt  DISCHARGE CONDITION:  Stable  ACTIVITY:  Activity as tolerated  OXYGEN:  Home Oxygen: No.   Oxygen Delivery: room air  DISCHARGE LOCATION:  home   If you experience worsening of your admission symptoms, develop shortness of breath, life threatening emergency, suicidal or homicidal thoughts you must seek medical attention immediately by calling 911 or calling your MD immediately  if symptoms less severe.  You Must read complete instructions/literature along with all the possible adverse reactions/side effects for all the Medicines you take and that have been prescribed to you. Take any new Medicines after you have completely understood and accpet all the possible adverse reactions/side effects.   Please note  You were cared for by a hospitalist during your hospital stay. If you have any questions  about your discharge medications or the care you received while you were in the hospital after you are discharged, you can call the unit and asked to speak with the hospitalist on call if the hospitalist that took care of you is not available. Once you are discharged, your primary care physician will handle any further medical issues. Please note that NO REFILLS for any discharge medications will be authorized once you are discharged, as it is imperative that you return to your primary care physician (or establish a relationship with a primary care physician if you do not have one) for your aftercare needs so that they can reassess your need for medications and monitor your lab values.     Today  Chief Complaint  Patient presents with  . Weakness  . Fall   Patient denies any complaints resting comfortably.  She is mentally challenged.  ROS:  CONSTITUTIONAL: Denies fevers, chills. Denies any fatigue, weakness.  EYES: Denies blurry vision, double vision, eye pain. EARS, NOSE, THROAT: Denies tinnitus, ear pain, hearing loss. RESPIRATORY: Denies cough, wheeze, shortness of breath.  CARDIOVASCULAR: Denies chest pain, palpitations, edema.  GASTROINTESTINAL: Denies nausea, vomiting,  diarrhea, abdominal pain. Denies bright red blood per rectum. HEMATOLOGIC AND LYMPHATIC: Denies easy bruising or bleeding. SKIN: Denies rash or lesion. MUSCULOSKELETAL: Denies pain in neck, back, shoulder, knees, hips or arthritic symptoms.  NEUROLOGIC: Denies paralysis, paresthesias.  PSYCHIATRIC: Denies anxiety or depressive symptoms.   VITAL SIGNS:  Blood pressure (!) 145/80, pulse 75, temperature 97.8 F (36.6 C), temperature source Oral, resp. rate 19, height 5\' 3"  (1.6 m), weight 100.6 kg (221 lb 12.5 oz), SpO2 100 %.  I/O:    Intake/Output Summary (Last 24 hours) at 09/29/2017 1332 Last data filed at 09/28/2017 1919 Gross per 24 hour  Intake 2419 ml  Output -  Net 2419 ml    PHYSICAL EXAMINATION:   GENERAL:  57 y.o.-year-old patient lying in the bed with no acute distress.  EYES: Pupils equal, round, reactive to light and accommodation. No scleral icterus. Extraocular muscles intact.  HEENT: Head atraumatic, normocephalic. Oropharynx and nasopharynx clear.  NECK:  Supple, no jugular venous distention. No thyroid enlargement, no tenderness.  LUNGS: Normal breath sounds bilaterally, no wheezing, rales,rhonchi or crepitation. No use of accessory muscles of respiration.  CARDIOVASCULAR: S1, S2 normal. No murmurs, rubs, or gallops.  ABDOMEN: Soft, non-tender, non-distended. Bowel sounds present.  EXTREMITIES: No pedal edema, cyanosis, or clubbing.  NEUROLOGIC: Cranial nerves II through XII are intact. Muscle strength 5/5 in all extremities. Sensation intact. Gait not checked.  PSYCHIATRIC: The patient is alert and oriented x 3.  SKIN: No obvious rash, lesion, or ulcer.   DATA REVIEW:   CBC Recent Labs  Lab 09/28/17 0505  WBC 4.1  HGB 10.9*  HCT 33.2*  PLT 89*    Chemistries  Recent Labs  Lab 09/28/17 0505 09/29/17 0536  NA 141 140  K 4.3 4.3  CL 108 108  CO2 28 23  GLUCOSE 100* 107*  BUN 30* 27*  CREATININE 2.33* 1.92*  CALCIUM 8.8* 8.7*  MG 1.8  --     Cardiac Enzymes Recent Labs  Lab 09/25/17 1124  TROPONINI <0.03    Microbiology Results  No results found for this or any previous visit.  RADIOLOGY:  Dg Chest 2 View  Result Date: 09/25/2017 CLINICAL DATA:  Generalized weakness and malaise. Multiple falls. Mental retardation. EXAM: CHEST  2 VIEW COMPARISON:  None. FINDINGS: The heart size and mediastinal contours are within normal limits. Both lungs are clear. The visualized skeletal structures are unremarkable. IMPRESSION: No active cardiopulmonary disease. Electronically Signed   By: Elsie Stain M.D.   On: 09/25/2017 14:29   Dg Ankle Complete Right  Result Date: 09/25/2017 CLINICAL DATA:  Pt here with legal guardian that she lives with who reports pt  has been feeling weak and falling around. Per pt she hit her head a few days ago. Pt also injured rt ankle with fall. Pt denies pain other than ankle. EXAM: RIGHT ANKLE - COMPLETE 3+ VIEW COMPARISON:  None. FINDINGS: Previous plate and screw fixation of distal fibular shaft. Previous orthopedic screw fixation of transverse medial malleolus fracture in near anatomic alignment; the fracture line is evident, distracted approximately 2 mm. Ankle mortise appears intact. There is bilateral soft tissue swelling. No subcutaneous gas or radiodense foreign body. Calcaneal spur at the insertion of the plantar aponeurosis. IMPRESSION: 1. Postop changes in medial and lateral malleoli without evident complicating or acute features. Comparison with outside prior imaging may be useful to evaluate for subtle interval changes. Electronically Signed   By: Corlis Leak M.D.   On: 09/25/2017 14:01  Ct Head Wo Contrast  Result Date: 09/25/2017 CLINICAL DATA:  Weakness, falling, hit her head a few days ago EXAM: CT HEAD WITHOUT CONTRAST TECHNIQUE: Contiguous axial images were obtained from the base of the skull through the vertex without intravenous contrast. Sagittal and coronal MPR images reconstructed from axial data set. COMPARISON:  02/12/2015 FINDINGS: Brain: Generalized atrophy. Dilatation of the RIGHT lateral ventricle. Congenital absence of the corpus callosum. Atrophy of the LEFT cerebellar hemisphere with prominent CSF in the LEFT posterior cranial fossa. No intracranial hemorrhage, mass lesion or evidence acute infarction. 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 No acute intracranial abnormalities. Electronically Signed   By: Ulyses Southward M.D.   On: 09/25/2017 14:05   US Renal  Result Date: 09/28/2017 CLINICAL DATA:  Acute kidney injury.  EXAM: RENAL / URINARY TRACT ULTRASOUND COMPLETE COMPARISON:  None. FINDINGS: Right Kidney: Length: 16.2 cm. Numerous anechoic cysts throughout the kidney measuring to at least 4.3 cm with increased through transmission. No hydronephrosis. Left Kidney: Length: 15.2 cm. Numerous anechoic cysts throughout the kidney measuring to at least 5.5 cm with increased through transmission. No hydronephrosis. Bladder: Appears normal for degree of bladder distention. IMPRESSION: Numerous bilateral renal cysts most consistent with polycystic kidney disease. No obstructive uropathy. Electronically Signed   By: Awilda Metro M.D.   On: 09/28/2017 19:28    EKG:   Orders placed or performed during the hospital encounter of 09/25/17  . ED EKG  . ED EKG      Management plans discussed with the patient, family and they are in agreement.  CODE STATUS:     Code Status Orders  (From admission, onward)        Start     Ordered   09/25/17 2029  Full code  Continuous     09/25/17 2028    Code Status History    Date Active Date Inactive Code Status Order ID Comments User Context   09/17/2016 21:59 09/26/2016 20:08 Full Code [id number]  Shari Prows, MD Inpatient   02/13/2015 06:03 02/16/2015 18:54 Full Code [id number]  Clapacs, Jackquline Denmark, MD Inpatient      TOTAL TIME TAKING CARE OF THIS PATIENT: 43  minutes.   Note: This dictation was prepared with Dragon dictation along with smaller phrase technology. Any transcriptional errors that result from this process are unintentional.   @MEC @  on 09/29/2017 at 1:32 PM  Between 7am to 6pm - Pager - [phone]  After 6pm go to [email or web address] - password [email or web address] [facility]  [facility]  Office  [phone]  CC: Primary care physician; [facility], [facility]

## 2017-09-29 NOTE — Care Management Note (Signed)
Case Management Note  Patient Details  Name: Jenna Riggs MRN: [id number] Date of Birth: Dec 05, 1960   Patient to discharge home today.  Home health orders have been placed.  Brad with [facility] notified.  RW delivered to room.  Patients legal guardian has requested a walking boot for the sprain ankle.  One is to delivered to room prior to discharge by supply chain.  Family to provide discharge transport RNCM signing off.   Subjective/Objective:                    Action/Plan:   Expected Discharge Date:  09/29/17               Expected Discharge Plan:  Home w Home Health Services  In-House Referral:     Discharge planning Services  CM Consult  Post Acute Care Choice:  Home Health, Durable Medical Equipment Choice offered to:  Sibling  DME Arranged:  Walker rolling DME Agency:  [facility]  HH Arranged:  RN, PT, Social Work [employer] Agency:  [facility]  Status of Service:  Completed, signed off  If discussed at [employer] Meetings, dates discussed:    Additional CommentsChapman Fitch:  Aras Albarran T, RN 09/29/2017, 2:07 PM

## 2017-09-29 NOTE — Progress Notes (Signed)
[facility]  ROUNDING NOTE   Subjective:   Creatinine 1.92 (2.33)  Patient is eager to go home.   Objective:  Vital signs in last 24 hours:  Temp:  [97.5 F (36.4 C)-97.8 F (36.6 C)] 97.8 F (36.6 C) (02/01 0900) Pulse Rate:  [72-75] 75 (02/01 0900) Resp:  [19] 19 (02/01 0534) BP: (128-145)/(75-80) 145/80 (02/01 0900) SpO2:  [97 %-100 %] 100 % (02/01 0900)  Weight change:  Filed Weights   09/25/17 1117 09/25/17 2052  Weight: 117.9 kg (260 lb) 100.6 kg (221 lb 12.5 oz)    Intake/Output: I/O last 3 completed shifts: In: 3226 [P.O.:480; I.V.:2746] Out: -    Intake/Output this shift:  Total I/O In: 240 [P.O.:240] Out: 1200 [Urine:1200]  Physical Exam: General: NAD, sitting in chair  Head: Normocephalic, atraumatic. Moist oral mucosal membranes  Eyes: Anicteric, PERRL  Neck: Supple, trachea midline  Lungs:  Clear to auscultation  Heart: Regular rate and rhythm  Abdomen:  Soft, nontender,   Extremities:  1+ peripheral edema.  Neurologic: Nonfocal, moving all four extremities  Skin: No lesions       Basic Metabolic Panel: Recent Labs  Lab 09/25/17 1124 09/26/17 0635 09/27/17 0705 09/28/17 0505 09/29/17 0536  NA 135 137 137 141 140  K 3.5 2.8* 4.7 4.3 4.3  CL 93* 100* 106 108 108  CO2 29 29 27 28 23   GLUCOSE 153* 148* 101* 100* 107*  BUN 39* 35* 29* 30* 27*  CREATININE 2.50* 2.33* 2.19* 2.33* 1.92*  CALCIUM 10.1 8.9 8.8* 8.8* 8.7*  MG  --  1.7 2.1 1.8  --     Liver Function Tests: No results for input(s): AST, ALT, ALKPHOS, BILITOT, PROT, ALBUMIN in the last 168 hours. No results for input(s): LIPASE, AMYLASE in the last 168 hours. No results for input(s): AMMONIA in the last 168 hours.  CBC: Recent Labs  Lab 09/25/17 1124 09/26/17 0635 09/28/17 0505  WBC 10.8 4.6 4.1  HGB 14.2 12.2 10.9*  HCT 42.3 36.5 33.2*  MCV 95.0 96.1 96.9  PLT 98* 94* 89*    Cardiac Enzymes: Recent Labs  Lab 09/25/17 1124  TROPONINI <0.03     BNP: Invalid input(s): POCBNP  CBG: No results for input(s): GLUCAP in the last 168 hours.  Microbiology: No results found for this or any previous visit.  Coagulation Studies: No results for input(s): LABPROT, INR in the last 72 hours.  Urinalysis: No results for input(s): COLORURINE, LABSPEC, PHURINE, GLUCOSEU, HGBUR, BILIRUBINUR, KETONESUR, PROTEINUR, UROBILINOGEN, NITRITE, LEUKOCYTESUR in the last 72 hours.  Invalid input(s): APPERANCEUR    Imaging: [place] Renal  Result Date: 09/28/2017 CLINICAL DATA:  Acute kidney injury. EXAM: RENAL / URINARY TRACT ULTRASOUND COMPLETE COMPARISON:  None. FINDINGS: Right Kidney: Length: 16.2 cm. Numerous anechoic cysts throughout the kidney measuring to at least 4.3 cm with increased through transmission. No hydronephrosis. Left Kidney: Length: 15.2 cm. Numerous anechoic cysts throughout the kidney measuring to at least 5.5 cm with increased through transmission. No hydronephrosis. Bladder: Appears normal for degree of bladder distention. IMPRESSION: Numerous bilateral renal cysts most consistent with polycystic kidney disease. No obstructive uropathy. Electronically Signed   By: Awilda Metroourtnay  Bloomer M.D.   On: 09/28/2017 19:28     Medications:   . sodium chloride Stopped (09/29/17 1250)   . aspirin EC  81 mg Oral Daily  . divalproex  750 mg Oral q1800  . divalproex  500 mg Oral q morning - 10a  . heparin  5,000 Units Subcutaneous  Q8H  . QUEtiapine  150 mg Oral BID  . traZODone  300 mg Oral QHS  . cyanocobalamin  1,000 mcg Oral Daily   acetaminophen **OR** acetaminophen, ondansetron **OR** ondansetron (ZOFRAN) IV  Assessment/ Plan:  Ms. Kindra Bickham is a 57 y.o. Hispanic female with chronic kidney disease stage IV, hypertension, bipolar disorder, tardive dyskinesia, hypertension, hyperlipidemia  1. Acute renal failure on chronic kidney disease stage IV: baseline creatinine of 2, GFR of 28. Chronic kidney disease secondary to polycystic  kidney disease, hypertension and questionable history of lithium exposure.  Acute renal failure secondary to prerenal azotemia. Creatinine improving with IV fluids and holding hydrochlorothiazide.  - Will need nephrology follow up   2. Hypertension: blood pressure at goal. Currently holding hydrochlorothiazide.   3. Secondary Hyperparathyroidism: Hypercalcemia on admission - most likely secondary to renal failure and hydrochlorothiazide.  will need follow up PTH, and phosphorus.  - Calcium now at goal. Improved with IV fluids.    LOS: 3 Marlea Gambill 2/1/20192:46 PM

## 2017-09-29 NOTE — Discharge Instructions (Signed)
Follow-up with primary care physician in a week  follow-up with nephrology in a week

## 2017-09-29 NOTE — Consult Note (Signed)
PHARMACY CONSULT NOTE - INITIAL   Pharmacy Consult for Electrolyte Monitoring and Replacement   No Known Allergies  Patient Measurements: Height: 5\' 3"  (160 cm) Weight: 221 lb 12.5 oz (100.6 kg) IBW/kg (Calculated) : 52.4  Vital Signs: Temp: 97.5 F (36.4 C) (02/01 0534) Temp Source: Oral (02/01 0534) BP: 128/75 (02/01 0534) Pulse Rate: 72 (02/01 0534) Intake/Output from previous day: 01/31 0701 - 02/01 0700 In: 2539 [P.O.:480; I.V.:2059] Out: -  Intake/Output from this shift: No intake/output data recorded.  Labs: Recent Labs    09/27/17 0705 09/28/17 0505 09/29/17 0536  WBC  --  4.1  --   HGB  --  10.9*  --   HCT  --  33.2*  --   PLT  --  89*  --   CREATININE 2.19* 2.33* 1.92*  MG 2.1 1.8  --    Potassium (mmol/L)  Date Value  09/29/2017 4.3  03/24/2014 4.0   Magnesium (mg/dL)  Date Value  30/86/578401/31/2019 1.8   Calcium (mg/dL)  Date Value  69/62/952802/08/2017 8.7 (L)   Calcium, Total (mg/dL)  Date Value  41/32/440107/27/2015 8.5   Albumin (g/dL)  Date Value  02/72/536601/25/2018 3.7  03/24/2014 3.1 (L)  ] Estimated Creatinine Clearance: 37 mL/min (A) (by C-G formula based on SCr of 1.92 mg/dL (H)).  Assessment: Pharmacy consulted for electrolyte monitoring and replacement in 57 yo female admitted with acute renal failure secondary to dehydration.   Goal of Therapy:  Electrolytes WNL  Plan:  Electrolytes WNL. Scr improving. Will sign off at this time.   Gardner CandleSheema M Galya Dunnigan, PharmD, BCPS Clinical Pharmacist 09/29/2017 7:21 AM

## 2017-10-25 ENCOUNTER — Encounter: Payer: Self-pay | Admitting: Emergency Medicine

## 2017-10-25 ENCOUNTER — Emergency Department: Payer: Medicare Other

## 2017-10-25 ENCOUNTER — Other Ambulatory Visit: Payer: Self-pay

## 2017-10-25 ENCOUNTER — Emergency Department
Admission: EM | Admit: 2017-10-25 | Discharge: 2017-10-26 | Disposition: A | Discharge status: Home / Self Care | Payer: Medicare Other | Attending: Emergency Medicine | Admitting: Emergency Medicine

## 2017-10-25 DIAGNOSIS — F17210 Nicotine dependence, cigarettes, uncomplicated: Secondary | ICD-10-CM | POA: Insufficient documentation

## 2017-10-25 DIAGNOSIS — I10 Essential (primary) hypertension: Secondary | ICD-10-CM | POA: Insufficient documentation

## 2017-10-25 DIAGNOSIS — M25571 Pain in right ankle and joints of right foot: Secondary | ICD-10-CM | POA: Insufficient documentation

## 2017-10-25 DIAGNOSIS — F79 Unspecified intellectual disabilities: Secondary | ICD-10-CM | POA: Diagnosis not present

## 2017-10-25 DIAGNOSIS — G40909 Epilepsy, unspecified, not intractable, without status epilepticus: Secondary | ICD-10-CM | POA: Insufficient documentation

## 2017-10-25 DIAGNOSIS — Z79899 Other long term (current) drug therapy: Secondary | ICD-10-CM | POA: Diagnosis not present

## 2017-10-25 DIAGNOSIS — Z7982 Long term (current) use of aspirin: Secondary | ICD-10-CM | POA: Insufficient documentation

## 2017-10-25 HISTORY — DX: Disorder of kidney and ureter, unspecified: N28.9

## 2017-10-25 HISTORY — DX: Unspecified convulsions: R56.9

## 2017-10-25 LAB — CBC
HCT: 38.4 % (ref 35.0–47.0)
Hemoglobin: 13 g/dL (ref 12.0–16.0)
MCH: 32.5 pg (ref 26.0–34.0)
MCHC: 33.9 g/dL (ref 32.0–36.0)
MCV: 95.8 fL (ref 80.0–100.0)
Platelets: 119 10*3/uL — ABNORMAL LOW (ref 150–440)
RBC: 4.01 MIL/uL (ref 3.80–5.20)
RDW: 14.1 % (ref 11.5–14.5)
WBC: 4.4 10*3/uL (ref 3.6–11.0)

## 2017-10-25 LAB — BASIC METABOLIC PANEL
Anion gap: 9 (ref 5–15)
BUN: 33 mg/dL — AB (ref 6–20)
CALCIUM: 9.4 mg/dL (ref 8.9–10.3)
CO2: 26 mmol/L (ref 22–32)
Chloride: 101 mmol/L (ref 101–111)
Creatinine, Ser: 2.15 mg/dL — ABNORMAL HIGH (ref 0.44–1.00)
GFR calc Af Amer: 28 mL/min — ABNORMAL LOW (ref >60)
GFR, EST NON AFRICAN AMERICAN: 24 mL/min — AB (ref >60)
GLUCOSE: 117 mg/dL — AB (ref 65–99)
Potassium: 3.7 mmol/L (ref 3.5–5.1)
Sodium: 136 mmol/L (ref 135–145)

## 2017-10-25 LAB — VALPROIC ACID LEVEL: VALPROIC ACID LVL: 120 ug/mL — AB (ref 50.0–100.0)

## 2017-10-25 MED ORDER — CYCLOBENZAPRINE HCL 10 MG PO TABS
10.0000 mg | ORAL_TABLET | Freq: Once | ORAL | Status: AC
  Administered 2017-10-25: 10 mg via ORAL
  Filled 2017-10-25: qty 1

## 2017-10-25 MED ORDER — ACETAMINOPHEN 325 MG PO TABS
650.0000 mg | ORAL_TABLET | Freq: Once | ORAL | Status: AC
  Administered 2017-10-25: 650 mg via ORAL
  Filled 2017-10-25: qty 2

## 2017-10-25 MED ORDER — SODIUM CHLORIDE 0.9 % IV BOLUS (SEPSIS)
1000.0000 mL | Freq: Once | INTRAVENOUS | Status: AC
  Administered 2017-10-25: 1000 mL via INTRAVENOUS

## 2017-10-25 NOTE — ED Notes (Signed)
Pt given sandwich tray and water per request with MD approval.

## 2017-10-25 NOTE — ED Provider Notes (Signed)
[facility] Emergency Department Provider Note ____________________________________________   First MD Initiated Contact with Patient 10/25/17 1933     (approximate)  I have reviewed the triage vital signs and the nursing notes.   HISTORY  Chief Complaint Seizures    HPI Jenna Riggs is a 57 y.o. female with a history of seizure disorder and mental disability who presents with an apparent seizure, acute onset, now resolved, and not directly witnessed.  The patient states that she went to the bathroom and suddenly had ankle pain to her right ankle where she had had a previous fracture.  The patient states that the ankle sometimes gives out, causing her to fall, and this is what happened today.  The patient states that she then felt like her eyes went back into her head and she lost consciousness briefly.  The patient reports persistent right ankle pain, but denies any other complaints at this time.  She denies any recent changes in her medications.   Past Medical History:  Diagnosis Date  . Mental retardation   . Renal disorder   . Seizures [facility](HCC)     Patient Active Problem List   Diagnosis Date Noted  . ARF (acute renal failure) (HCC) 09/26/2017  . Acute renal failure (ARF) (HCC) 09/25/2017  . Vitamin B12 deficiency 09/20/2016  . Tobacco use disorder 09/17/2016  . UTI (urinary tract infection) 09/17/2016  . 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 HTN (hypertension) 02/13/2015  . 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 Impulse control disorder (skin picking) 02/13/2015  . Bipolar I disorder with anxious distress (HCC) 02/12/2015    Past Surgical History:  Procedure Laterality Date  . ANKLE FUSION Right     Prior to Admission medications   Medication Sig Start Date End Date Taking? Authorizing Provider  aspirin EC 81 MG tablet Take 81 mg by mouth daily.    [provider]  divalproex (DEPAKOTE ER) 250 MG 24 hr tablet  Take 3 tablets (750 mg total) by mouth daily at 6 PM. 09/29/17   Gouru, Aruna, MD  divalproex (DEPAKOTE) 500 MG DR tablet Take 1 tablet (500 mg total) by mouth every morning. 09/30/17   Gouru, Deanna ArtisAruna, MD  QUEtiapine (SEROQUEL) 100 MG tablet Take 1 tablet (100 mg total) by mouth 2 (two) times daily. Patient taking differently: Take 150 mg by mouth 2 (two) times daily.  09/22/16   Pucilowska, Braulio ConteJolanta B, MD  traZODone (DESYREL) 100 MG tablet Take 300 mg by mouth at bedtime.    [provider]  vitamin B-12 1000 MCG tablet Take 1 tablet (1,000 mcg total) by mouth daily. 09/24/16   Pucilowska, Ellin GoodieJolanta B, MD    Allergies Patient has no known allergies.  Family History  Problem Relation Age of Onset  . Diabetes Mother   . Diabetes Father     Social History Social History   Tobacco Use  . Smoking status: Current Every Day Smoker    Packs/day: 1.00    Years: 35.00    Pack years: 35.00    Types: Cigarettes  . Smokeless tobacco: Never Used  . Tobacco comment: Patient refused  Substance Use Topics  . Alcohol use: No  . Drug use: No    Review of Systems  Constitutional: No fever. Eyes: No redness. ENT: No neck pain. Cardiovascular: Denies chest pain. Respiratory: Denies shortness of breath. Gastrointestinal: No nausea, no vomiting.  Genitourinary: Negative for d flank pain ysuria.  Musculoskeletal: Positive for right ankle pain. Skin:  Negative for rash. Neurological: Negative for headaches.  Positive for seizure.   ____________________________________________   PHYSICAL EXAM:  VITAL SIGNS: ED Triage Vitals  Enc Vitals Group     BP 10/25/17 1904 130/85     Pulse Rate 10/25/17 1904 76     Resp 10/25/17 1904 16     Temp 10/25/17 1904 97.7 F (36.5 C)     Temp Source 10/25/17 1904 Oral     SpO2 10/25/17 1904 99 %     Weight 10/25/17 1908 200 lb (90.7 kg)     Height 10/25/17 1908 5\' 3"  (1.6 m)     Head Circumference --      Peak Flow --      Pain Score 10/25/17 1908  10     Pain Loc --      Pain Edu? --      Excl. in GC? --     Constitutional: Alert and oriented.  Relatively comfortable appearing and in no acute distress. Eyes: Conjunctivae are normal.  EOMI.  PERRLA. Head: Atraumatic. Nose: No congestion/rhinnorhea. Mouth/Throat: Mucous membranes are dry.   Neck: Normal range of motion.  Cardiovascular: Normal rate, regular rhythm. Grossly normal heart sounds.  Good peripheral circulation. Respiratory: Normal respiratory effort.  No retractions. Lungs CTAB. Gastrointestinal: Soft and nontender. No distention.  Genitourinary: No flank tenderness. Musculoskeletal:  Extremities warm and well perfused.  Right ankle with mild lateral tenderness, and mild tenderness to the posterior lower leg.  No swelling.  2+ DP pulse.  Full range of motion at the ankle.  No deformity. Neurologic:  Normal speech and language.  Motor and sensory intact in all extremities.  Normal coordination, with no ataxia on finger to nose.  Cranial nerves III through XII intact. Skin:  Skin is warm and dry. No rash noted. Psychiatric: Speech and behavior are normal.  ____________________________________________   LABS (all labs ordered are listed, but only abnormal results are displayed)  Labs Reviewed  VALPROIC ACID LEVEL - Abnormal; Notable for the following components:      Result Value   Valproic Acid Lvl 120 (*)    All other components within normal limits  BASIC METABOLIC PANEL - Abnormal; Notable for the following components:   Glucose, Bld 117 (*)    BUN 33 (*)    Creatinine, Ser 2.15 (*)    GFR calc non Af Amer 24 (*)    GFR calc Af Amer 28 (*)    All other components within normal limits  CBC - Abnormal; Notable for the following components:   Platelets 119 (*)    All other components within normal limits  CBG MONITORING, ED   ____________________________________________  EKG  ED ECG REPORT I, Dionne Bucy, the attending physician, personally viewed  and interpreted this ECG.  Date: 10/25/2017 EKG Time: 1906 Rate: 75 Rhythm: normal sinus rhythm QRS Axis: normal Intervals: normal ST/T Wave abnormalities: Nonspecific T wave abnormalities anteriorly Narrative Interpretation: no evidence of acute ischemia; no significant change when compared to EKG of 09/25/2017  ____________________________________________  RADIOLOGY  XR R ankle and tib/fib: no acute fracture  ____________________________________________   PROCEDURES  Procedure(s) performed: No  Procedures  Critical Care performed: No ____________________________________________   INITIAL IMPRESSION / ASSESSMENT AND PLAN / ED COURSE  Pertinent labs & imaging results that were available during my care of the patient were reviewed by me and considered in my medical decision making (see chart for details).  57 year old female with mental impairment and seizure disorder presents after  an apparent seizure that occurred when her right ankle "gave out" which has happened previously.  Patient reports persistent pain in the right ankle but denies any other acute complaints.  I reviewed the past medical records in Epic; the patient was admitted (by me) earlier this month for acute renal insufficiency and dehydration after she presented with generalized weakness.  On exam, vital signs are normal except for slight hypertension, there is no evidence of trauma, neuro exam is nonfocal, and the patient is somewhat dry mucous membranes.  There is tenderness to the right ankle but no deformity.  Presentation is most consistent with seizure versus syncope.  Patient does appear somewhat dehydrated.  Right ankle is likely exacerbation of chronic pain.  Plan: Basic labs, Depakote level, IV fluids, x-ray of the right ankle and tib-fib and reassess.   ----------------------------------------- 8:37 PM on 10/25/2017 -----------------------------------------  X-rays are negative.  Lab workup  reveals elevated creatinine, however this is actually improved from the patient's admission last month when her creatinine was as high as 2.5.  Her valproic acid is slightly supratherapeutic, so she will be instructed to skip a dose.We are giving IV fluids for the patient's dehydration, and she is eating a meal.  The patient states that her family members are coming to the hospital, but they are not here yet.    At this time, based on the workup there is no indication for admission.  I am signing out the patient to the oncoming physician Dr. Roxan Hockey to reassess the patient after fluids and check in with family members to verify the patient is safe for discharge home.  ____________________________________________   FINAL CLINICAL IMPRESSION(S) / ED DIAGNOSES  Final diagnoses:  Seizure disorder (HCC)      NEW MEDICATIONS STARTED DURING THIS VISIT:  New Prescriptions   No medications on file     Note:  This document was prepared using Dragon voice recognition software and may include unintentional dictation errors.    Dionne Bucy, MD 10/25/17 2039

## 2017-10-25 NOTE — ED Triage Notes (Addendum)
Pt bib [facility] d/t fall (old ankle fracture, "gives out" often resulting in falls) and then seizure once laying down. Pt has hx seizures, unknown last seizure, taking depakote. Pt reporting Rt ankle pain 10/10. Pt alert and oriented, lives at home with sister. VSS, CBG120

## 2017-10-26 NOTE — ED Notes (Signed)
Pt. Family verbalizes understanding of d/c instructions, medications, and follow-up. VS stable and pain controlled per pt.  Pt. In NAD at time of d/c and family denies further concerns regarding this visit. Pt. Stable at the time of departure from the unit, departing unit by the safest and most appropriate manner per that pt condition and limitations with all belongings accounted for. Pt family advised to return to the ED at any time for emergent concerns, or for new/worsening symptoms.

## 2018-01-20 ENCOUNTER — Other Ambulatory Visit: Payer: Self-pay

## 2018-01-20 ENCOUNTER — Emergency Department
Admission: EM | Admit: 2018-01-20 | Discharge: 2018-01-20 | Disposition: A | Discharge status: Home / Self Care | Payer: Medicare Other | Attending: Emergency Medicine | Admitting: Emergency Medicine

## 2018-01-20 ENCOUNTER — Encounter: Payer: Self-pay | Admitting: Emergency Medicine

## 2018-01-20 DIAGNOSIS — Z79899 Other long term (current) drug therapy: Secondary | ICD-10-CM | POA: Insufficient documentation

## 2018-01-20 DIAGNOSIS — F17210 Nicotine dependence, cigarettes, uncomplicated: Secondary | ICD-10-CM | POA: Insufficient documentation

## 2018-01-20 DIAGNOSIS — Z7982 Long term (current) use of aspirin: Secondary | ICD-10-CM | POA: Diagnosis not present

## 2018-01-20 DIAGNOSIS — Z23 Encounter for immunization: Secondary | ICD-10-CM | POA: Insufficient documentation

## 2018-01-20 DIAGNOSIS — R451 Restlessness and agitation: Secondary | ICD-10-CM

## 2018-01-20 DIAGNOSIS — F71 Moderate intellectual disabilities: Secondary | ICD-10-CM | POA: Diagnosis present

## 2018-01-20 DIAGNOSIS — F319 Bipolar disorder, unspecified: Secondary | ICD-10-CM | POA: Diagnosis not present

## 2018-01-20 DIAGNOSIS — F639 Impulse disorder, unspecified: Secondary | ICD-10-CM | POA: Diagnosis present

## 2018-01-20 DIAGNOSIS — I10 Essential (primary) hypertension: Secondary | ICD-10-CM | POA: Insufficient documentation

## 2018-01-20 LAB — COMPREHENSIVE METABOLIC PANEL
ALT: 12 U/L — AB (ref 14–54)
AST: 16 U/L (ref 15–41)
Albumin: 4.1 g/dL (ref 3.5–5.0)
Alkaline Phosphatase: 54 U/L (ref 38–126)
Anion gap: 7 (ref 5–15)
BUN: 36 mg/dL — AB (ref 6–20)
CHLORIDE: 102 mmol/L (ref 101–111)
CO2: 29 mmol/L (ref 22–32)
CREATININE: 1.84 mg/dL — AB (ref 0.44–1.00)
Calcium: 9.7 mg/dL (ref 8.9–10.3)
GFR calc Af Amer: 34 mL/min — ABNORMAL LOW (ref >60)
GFR calc non Af Amer: 30 mL/min — ABNORMAL LOW (ref >60)
GLUCOSE: 111 mg/dL — AB (ref 65–99)
Potassium: 3.2 mmol/L — ABNORMAL LOW (ref 3.5–5.1)
Sodium: 138 mmol/L (ref 135–145)
Total Bilirubin: 0.6 mg/dL (ref 0.3–1.2)
Total Protein: 8 g/dL (ref 6.5–8.1)

## 2018-01-20 LAB — URINE DRUG SCREEN, QUALITATIVE (ARMC ONLY)
Amphetamines, Ur Screen: NOT DETECTED
BARBITURATES, UR SCREEN: NOT DETECTED
BENZODIAZEPINE, UR SCRN: NOT DETECTED
CANNABINOID 50 NG, UR ~~LOC~~: NOT DETECTED
COCAINE METABOLITE, UR ~~LOC~~: NOT DETECTED
MDMA (Ecstasy)Ur Screen: NOT DETECTED
Methadone Scn, Ur: NOT DETECTED
Opiate, Ur Screen: NOT DETECTED
Phencyclidine (PCP) Ur S: NOT DETECTED
TRICYCLIC, UR SCREEN: NOT DETECTED

## 2018-01-20 LAB — URINALYSIS, COMPLETE (UACMP) WITH MICROSCOPIC
BACTERIA UA: NONE SEEN
BILIRUBIN URINE: NEGATIVE
Glucose, UA: NEGATIVE mg/dL
HGB URINE DIPSTICK: NEGATIVE
Ketones, ur: NEGATIVE mg/dL
Leukocytes, UA: NEGATIVE
NITRITE: NEGATIVE
PROTEIN: NEGATIVE mg/dL
SPECIFIC GRAVITY, URINE: 1.012 (ref 1.005–1.030)
pH: 6 (ref 5.0–8.0)

## 2018-01-20 LAB — CBC WITH DIFFERENTIAL/PLATELET
Basophils Absolute: 0 10*3/uL (ref 0–0.1)
Basophils Relative: 0 %
Eosinophils Absolute: 0.2 10*3/uL (ref 0–0.7)
Eosinophils Relative: 2 %
HCT: 39.7 % (ref 35.0–47.0)
HEMOGLOBIN: 14.1 g/dL (ref 12.0–16.0)
LYMPHS ABS: 1.4 10*3/uL (ref 1.0–3.6)
Lymphocytes Relative: 18 %
MCH: 33 pg (ref 26.0–34.0)
MCHC: 35.4 g/dL (ref 32.0–36.0)
MCV: 93.2 fL (ref 80.0–100.0)
MONOS PCT: 7 %
Monocytes Absolute: 0.5 10*3/uL (ref 0.2–0.9)
NEUTROS ABS: 5.8 10*3/uL (ref 1.4–6.5)
NEUTROS PCT: 73 %
Platelets: 247 10*3/uL (ref 150–440)
RBC: 4.27 MIL/uL (ref 3.80–5.20)
RDW: 13.5 % (ref 11.5–14.5)
WBC: 7.9 10*3/uL (ref 3.6–11.0)

## 2018-01-20 LAB — SALICYLATE LEVEL: Salicylate Lvl: <7 mg/dL (ref 2.8–30.0)

## 2018-01-20 LAB — ETHANOL: Alcohol, Ethyl (B): <10 mg/dL (ref <10)

## 2018-01-20 LAB — ACETAMINOPHEN LEVEL: Acetaminophen (Tylenol), Serum: <10 ug/mL — ABNORMAL LOW (ref 10–30)

## 2018-01-20 LAB — VALPROIC ACID LEVEL

## 2018-01-20 MED ORDER — TETANUS-DIPHTH-ACELL PERTUSSIS 5-2.5-18.5 LF-MCG/0.5 IM SUSP
0.5000 mL | Freq: Once | INTRAMUSCULAR | Status: AC
  Administered 2018-01-20: 0.5 mL via INTRAMUSCULAR
  Filled 2018-01-20: qty 0.5

## 2018-01-20 MED ORDER — QUETIAPINE FUMARATE 25 MG PO TABS
150.0000 mg | ORAL_TABLET | Freq: Two times a day (BID) | ORAL | Status: DC
  Administered 2018-01-20: 150 mg via ORAL
  Filled 2018-01-20: qty 6

## 2018-01-20 MED ORDER — DIVALPROEX SODIUM 500 MG PO DR TAB
500.0000 mg | DELAYED_RELEASE_TABLET | Freq: Two times a day (BID) | ORAL | Status: DC
  Administered 2018-01-20: 500 mg via ORAL
  Filled 2018-01-20: qty 1
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
## 2018-01-20 NOTE — Discharge Instructions (Addendum)
Please take all of your medicines as prescribed every day. please follow-up with your psychiatrist. Please also follow-up with your regular doctor. Return here for any further problems. If you have any problem getting all of your psychiatrist she can always come to [facility]. Contact information is on the other sheet of paper that I gave you.
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
## 2018-03-12 ENCOUNTER — Other Ambulatory Visit: Payer: Self-pay | Admitting: Nurse Practitioner

## 2018-03-12 DIAGNOSIS — Z1231 Encounter for screening mammogram for malignant neoplasm of breast: Secondary | ICD-10-CM

## 2018-11-12 ENCOUNTER — Emergency Department
Admission: EM | Admit: 2018-11-12 | Discharge: 2018-11-14 | Disposition: A | Discharge status: Home / Self Care | Payer: Medicare HMO | Attending: Emergency Medicine | Admitting: Emergency Medicine

## 2018-11-12 ENCOUNTER — Other Ambulatory Visit: Payer: Self-pay

## 2018-11-12 ENCOUNTER — Encounter: Payer: Self-pay | Admitting: Emergency Medicine

## 2018-11-12 DIAGNOSIS — F4322 Adjustment disorder with anxiety: Secondary | ICD-10-CM | POA: Insufficient documentation

## 2018-11-12 DIAGNOSIS — Z79899 Other long term (current) drug therapy: Secondary | ICD-10-CM | POA: Diagnosis not present

## 2018-11-12 DIAGNOSIS — F78 Other intellectual disabilities: Secondary | ICD-10-CM | POA: Diagnosis not present

## 2018-11-12 DIAGNOSIS — F319 Bipolar disorder, unspecified: Secondary | ICD-10-CM | POA: Insufficient documentation

## 2018-11-12 DIAGNOSIS — I10 Essential (primary) hypertension: Secondary | ICD-10-CM | POA: Insufficient documentation

## 2018-11-12 DIAGNOSIS — F17210 Nicotine dependence, cigarettes, uncomplicated: Secondary | ICD-10-CM | POA: Diagnosis not present

## 2018-11-12 DIAGNOSIS — Z008 Encounter for other general examination: Secondary | ICD-10-CM | POA: Insufficient documentation

## 2018-11-12 DIAGNOSIS — Z7982 Long term (current) use of aspirin: Secondary | ICD-10-CM | POA: Diagnosis not present

## 2018-11-12 DIAGNOSIS — R4689 Other symptoms and signs involving appearance and behavior: Secondary | ICD-10-CM

## 2018-11-12 DIAGNOSIS — R451 Restlessness and agitation: Secondary | ICD-10-CM

## 2018-11-12 LAB — COMPREHENSIVE METABOLIC PANEL
ALK PHOS: 53 U/L (ref 38–126)
ALT: 12 U/L (ref 0–44)
ANION GAP: 9 (ref 5–15)
AST: 18 U/L (ref 15–41)
Albumin: 4.6 g/dL (ref 3.5–5.0)
BILIRUBIN TOTAL: 0.4 mg/dL (ref 0.3–1.2)
BUN: 34 mg/dL — ABNORMAL HIGH (ref 6–20)
CALCIUM: 10.2 mg/dL (ref 8.9–10.3)
CO2: 24 mmol/L (ref 22–32)
Chloride: 110 mmol/L (ref 98–111)
Creatinine, Ser: 1.97 mg/dL — ABNORMAL HIGH (ref 0.44–1.00)
GFR calc Af Amer: 32 mL/min — ABNORMAL LOW (ref >60)
GFR calc non Af Amer: 28 mL/min — ABNORMAL LOW (ref >60)
Glucose, Bld: 164 mg/dL — ABNORMAL HIGH (ref 70–99)
POTASSIUM: 4.4 mmol/L (ref 3.5–5.1)
Sodium: 143 mmol/L (ref 135–145)
TOTAL PROTEIN: 8.1 g/dL (ref 6.5–8.1)

## 2018-11-12 LAB — URINE DRUG SCREEN, QUALITATIVE (ARMC ONLY)
Amphetamines, Ur Screen: NOT DETECTED
BARBITURATES, UR SCREEN: NOT DETECTED
BENZODIAZEPINE, UR SCRN: NOT DETECTED
CANNABINOID 50 NG, UR ~~LOC~~: NOT DETECTED
COCAINE METABOLITE, UR ~~LOC~~: NOT DETECTED
MDMA (Ecstasy)Ur Screen: NOT DETECTED
Methadone Scn, Ur: NOT DETECTED
Opiate, Ur Screen: NOT DETECTED
Phencyclidine (PCP) Ur S: NOT DETECTED
TRICYCLIC, UR SCREEN: NOT DETECTED

## 2018-11-12 LAB — CBC
HCT: 39 % (ref 36.0–46.0)
Hemoglobin: 12.9 g/dL (ref 12.0–15.0)
MCH: 31.1 pg (ref 26.0–34.0)
MCHC: 33.1 g/dL (ref 30.0–36.0)
MCV: 94 fL (ref 80.0–100.0)
PLATELETS: 292 10*3/uL (ref 150–400)
RBC: 4.15 MIL/uL (ref 3.87–5.11)
RDW: 13.5 % (ref 11.5–15.5)
WBC: 9.3 10*3/uL (ref 4.0–10.5)
nRBC: 0 % (ref 0.0–0.2)

## 2018-11-12 LAB — SALICYLATE LEVEL

## 2018-11-12 LAB — ACETAMINOPHEN LEVEL

## 2018-11-12 LAB — ETHANOL: Alcohol, Ethyl (B): <10 mg/dL (ref <10)

## 2018-11-12 NOTE — ED Notes (Signed)
VOL/ SOC ordered & called waiting on call back for evaluation/ Pt moved to BHU- 1
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
## 2018-11-12 NOTE — ED Notes (Signed)
Hourly rounding reveals patient in room. No complaints, stable, in no acute distress. Q15 minute rounds and monitoring via Rover and Officer to continue.   

## 2018-11-12 NOTE — ED Notes (Signed)
Pt. Transferred to BHU from ED to room 1 after screening for contraband. Report to include Situation, Background, Assessment and Recommendations from Hewan RN. Pt. Oriented to unit including Q15 minute rounds as well as the security cameras for their protection. Patient is alert and oriented, warm and dry in no acute distress. Patient denies SI, HI, and AVH. Pt. Encouraged to let me know if needs arise. 

## 2018-11-12 NOTE — ED Notes (Signed)
Snack and beverage given. 
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
## 2018-11-12 NOTE — ED Notes (Signed)
Pt. Transferred from Triage to room after dressing out and screening for contraband. Report to include Situation, Background, Assessment and Recommendations from [facility]. Pt. Oriented to [place] including Q15 minute rounds as well as Psychologist, counselling for their protection. Patient is alert and oriented, warm and dry in no acute distress. Patient reported SI, HI contracted for safety. Denied AVH and pain. Pt. Encouraged to let me know if needs arise.

## 2018-11-13 DIAGNOSIS — F4322 Adjustment disorder with anxiety: Secondary | ICD-10-CM | POA: Diagnosis not present

## 2018-11-13 DIAGNOSIS — R4689 Other symptoms and signs involving appearance and behavior: Secondary | ICD-10-CM

## 2018-11-13 MED ORDER — TRAZODONE HCL 100 MG PO TABS
100.0000 mg | ORAL_TABLET | Freq: Every day | ORAL | Status: DC
  Administered 2018-11-13: 100 mg via ORAL
  Filled 2018-11-13: qty 1

## 2018-11-13 MED ORDER — QUETIAPINE FUMARATE 25 MG PO TABS
100.0000 mg | ORAL_TABLET | Freq: Two times a day (BID) | ORAL | Status: DC
  Administered 2018-11-13 – 2018-11-14 (×3): 100 mg via ORAL
  Filled 2018-11-13 (×3): qty 4

## 2018-11-13 MED ORDER — ONDANSETRON 4 MG PO TBDP
4.0000 mg | ORAL_TABLET | Freq: Once | ORAL | Status: AC
  Administered 2018-11-13: 4 mg via ORAL
  Filled 2018-11-13 (×2): qty 1

## 2018-11-13 MED ORDER — ONDANSETRON 4 MG PO TBDP
4.0000 mg | ORAL_TABLET | Freq: Three times a day (TID) | ORAL | Status: DC | PRN
  Filled 2018-11-13: qty 1

## 2018-11-13 MED ORDER — DIVALPROEX SODIUM ER 500 MG PO TB24
750.0000 mg | ORAL_TABLET | Freq: Every day | ORAL | Status: DC
  Filled 2018-11-13 (×3): qty 1

## 2018-11-13 MED ORDER — LISINOPRIL 5 MG PO TABS
10.0000 mg | ORAL_TABLET | Freq: Once | ORAL | Status: AC
  Administered 2018-11-13: 10 mg via ORAL
  Filled 2018-11-13: qty 2

## 2018-11-13 MED ORDER — ASPIRIN EC 81 MG PO TBEC
81.0000 mg | DELAYED_RELEASE_TABLET | Freq: Every day | ORAL | Status: DC
  Administered 2018-11-13 – 2018-11-14 (×2): 81 mg via ORAL
  Filled 2018-11-13 (×2): qty 1

## 2018-11-13 MED ORDER — IBUPROFEN 800 MG PO TABS
400.0000 mg | ORAL_TABLET | Freq: Once | ORAL | Status: AC
  Administered 2018-11-13: 400 mg via ORAL
  Filled 2018-11-13: qty 1

## 2018-11-13 NOTE — ED Notes (Signed)
Hourly rounding reveals patient sleeping in room. No complaints, stable, in no acute distress. Q15 minute rounds and monitoring via Security Cameras to continue. 

## 2018-11-13 NOTE — ED Notes (Signed)
Report to include Situation, Background, Assessment, and Recommendations received from Jadeka RN. Patient alert and oriented, warm and dry, in no acute distress. Patient denies SI, HI, AVH and pain. Patient made aware of Q15 minute rounds and security cameras for their safety. Patient instructed to come to me with needs or concerns. 

## 2018-11-13 NOTE — ED Notes (Signed)
Hourly rounding reveals patient in room. No complaints, stable, in no acute distress. Q15 minute rounds and monitoring via Security Cameras to continue. 
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
## 2018-11-13 NOTE — Discharge Instructions (Addendum)
Please follow up with your mental health provider this week for continued evaluation of your symptoms.

## 2018-11-13 NOTE — ED Notes (Signed)
Pt. Talking to SOC Psychiatrist. 
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
## 2018-11-13 NOTE — ED Notes (Signed)
Patient took medications, with no issues
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
## 2018-11-13 NOTE — ED Provider Notes (Signed)
-----------------------------------------   4:13 AM on 11/13/2018 ----------------------------------------- Psychiatry consult note reviewed, case discussed with the psychiatrist after he completed his assessment.  No acute psychiatric needs, no safety issues.  Stable.  Medically stable.  Suitable for discharge home with family with whom the patient lives.   Sharman Cheek, MD 11/13/18 [phone]

## 2018-11-13 NOTE — ED Notes (Signed)
VOL  PENDING  PLACEMENT 

## 2018-11-13 NOTE — TOC Transition Note (Signed)
Transition of Care [facility] - CM/SW Discharge Note   Patient Details  Name: Jenna Riggs MRN: [id number] Date of Birth: April 27, 1961  Transition of Care [facility]) CM/SW Contact:  Allayne Butcher, RN Phone Number: 11/13/2018, 9:46 AM   Clinical Narrative:    Patient has legal guardian her brother and sister in law.  Patient has been cleared by psych and will be discharged back into the care of her guardians.  Patient refused to leave this morning but she has no alternative placement, and her family are her legal guardians, she will be discharged into their care.    Final next level of care: Home/Self Care Barriers to Discharge: Other (comment)(Patient refused to leave this morning)   Patient Goals and CMS Choice        Discharge Placement                       Discharge Plan and Services                        Social Determinants of Health (SDOH) Interventions     Readmission Risk Interventions No flowsheet data found.

## 2018-11-13 NOTE — ED Notes (Signed)
Gave pt food tray with juice. 
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
## 2018-11-14 DIAGNOSIS — F4322 Adjustment disorder with anxiety: Secondary | ICD-10-CM | POA: Diagnosis not present

## 2018-11-14 NOTE — ED Notes (Signed)
Hourly rounding reveals patient in room. No complaints, stable, in no acute distress. Q15 minute rounds and monitoring via Security Cameras to continue. 

## 2018-11-14 NOTE — ED Notes (Addendum)
Hourly rounding reveals patient in room. No complaints, stable, in no acute distress. Q15 minute rounds and monitoring via Security Cameras to continue. 

## 2018-11-14 NOTE — ED Notes (Signed)
VOL/Pending Placement 

## 2018-11-14 NOTE — ED Notes (Signed)
Breakfast tray given.  Hand hygiene encouraged.   

## 2018-11-14 NOTE — ED Notes (Signed)
Called legal guardians to let them know that the patient is up for discharge.  Legal guardian stated that someone would be here to pick her up shortly.

## 2018-11-14 NOTE — ED Provider Notes (Signed)
-----------------------------------------   6:24 AM on 11/14/2018 -----------------------------------------   Blood pressure (!) 101/58, pulse 67, temperature 98.2 F (36.8 C), temperature source Oral, resp. rate 18, height 5\' 3"  (1.6 m), weight 99.8 kg, SpO2 97 %.  The patient is calm and cooperative at this time.  There have been no acute events since the last update.  Awaiting disposition plan from Behavioral Medicine team.   Irean Hong, MD 11/14/18 [phone]

## 2018-11-14 NOTE — ED Notes (Signed)
Pt asked if she would like to take a shower.  Pt refused, stated that she took a shower yesterday evening.  Pt did request a clean pair of socks.

## 2018-11-14 NOTE — ED Notes (Signed)
Pt discharged to legal guardian in the lobby.   Verbalized understanding of discharge instructions and follow up care.  Pt denies SI, HI, and A/V hallucinations.  Contracts for safety.  1:1 belongings bag returned to patient.  Pt unable to sign signature pad as she is not her own legal guardian.  Printed discharge signature obtained from legal guardian for records.

## 2019-02-27 ENCOUNTER — Encounter: Payer: Self-pay | Admitting: Emergency Medicine

## 2019-02-27 ENCOUNTER — Other Ambulatory Visit: Payer: Self-pay

## 2019-02-27 ENCOUNTER — Emergency Department
Admission: EM | Admit: 2019-02-27 | Discharge: 2019-03-01 | Disposition: A | Discharge status: Other Acute Inpatient Hospital | Payer: Medicare HMO | Attending: Emergency Medicine | Admitting: Emergency Medicine

## 2019-02-27 DIAGNOSIS — F639 Impulse disorder, unspecified: Secondary | ICD-10-CM | POA: Diagnosis present

## 2019-02-27 DIAGNOSIS — Z7982 Long term (current) use of aspirin: Secondary | ICD-10-CM | POA: Insufficient documentation

## 2019-02-27 DIAGNOSIS — Z79899 Other long term (current) drug therapy: Secondary | ICD-10-CM | POA: Insufficient documentation

## 2019-02-27 DIAGNOSIS — F319 Bipolar disorder, unspecified: Secondary | ICD-10-CM | POA: Diagnosis present

## 2019-02-27 DIAGNOSIS — Z049 Encounter for examination and observation for unspecified reason: Secondary | ICD-10-CM

## 2019-02-27 DIAGNOSIS — F22 Delusional disorders: Secondary | ICD-10-CM | POA: Insufficient documentation

## 2019-02-27 DIAGNOSIS — I10 Essential (primary) hypertension: Secondary | ICD-10-CM | POA: Insufficient documentation

## 2019-02-27 DIAGNOSIS — Q040 Congenital malformations of corpus callosum: Secondary | ICD-10-CM

## 2019-02-27 DIAGNOSIS — R569 Unspecified convulsions: Secondary | ICD-10-CM | POA: Diagnosis not present

## 2019-02-27 DIAGNOSIS — N390 Urinary tract infection, site not specified: Secondary | ICD-10-CM | POA: Diagnosis present

## 2019-02-27 DIAGNOSIS — F3163 Bipolar disorder, current episode mixed, severe, without psychotic features: Secondary | ICD-10-CM

## 2019-02-27 DIAGNOSIS — N179 Acute kidney failure, unspecified: Secondary | ICD-10-CM | POA: Diagnosis present

## 2019-02-27 DIAGNOSIS — R451 Restlessness and agitation: Secondary | ICD-10-CM | POA: Diagnosis present

## 2019-02-27 DIAGNOSIS — F17210 Nicotine dependence, cigarettes, uncomplicated: Secondary | ICD-10-CM | POA: Insufficient documentation

## 2019-02-27 DIAGNOSIS — F71 Moderate intellectual disabilities: Secondary | ICD-10-CM | POA: Diagnosis present

## 2019-02-27 DIAGNOSIS — F17200 Nicotine dependence, unspecified, uncomplicated: Secondary | ICD-10-CM | POA: Diagnosis present

## 2019-02-27 DIAGNOSIS — Z20828 Contact with and (suspected) exposure to other viral communicable diseases: Secondary | ICD-10-CM | POA: Insufficient documentation

## 2019-02-27 DIAGNOSIS — R4689 Other symptoms and signs involving appearance and behavior: Secondary | ICD-10-CM | POA: Diagnosis present

## 2019-02-27 DIAGNOSIS — E538 Deficiency of other specified B group vitamins: Secondary | ICD-10-CM | POA: Diagnosis present

## 2019-02-27 LAB — COMPREHENSIVE METABOLIC PANEL
ALT: 14 U/L (ref 0–44)
AST: 14 U/L — ABNORMAL LOW (ref 15–41)
Albumin: 4.5 g/dL (ref 3.5–5.0)
Alkaline Phosphatase: 67 U/L (ref 38–126)
Anion gap: 9 (ref 5–15)
BUN: 29 mg/dL — ABNORMAL HIGH (ref 6–20)
CO2: 23 mmol/L (ref 22–32)
Calcium: 10.2 mg/dL (ref 8.9–10.3)
Chloride: 107 mmol/L (ref 98–111)
Creatinine, Ser: 1.7 mg/dL — ABNORMAL HIGH (ref 0.44–1.00)
GFR calc Af Amer: 38 mL/min — ABNORMAL LOW (ref >60)
GFR calc non Af Amer: 33 mL/min — ABNORMAL LOW (ref >60)
Glucose, Bld: 105 mg/dL — ABNORMAL HIGH (ref 70–99)
Potassium: 4.2 mmol/L (ref 3.5–5.1)
Sodium: 139 mmol/L (ref 135–145)
Total Bilirubin: 0.7 mg/dL (ref 0.3–1.2)
Total Protein: 8.3 g/dL — ABNORMAL HIGH (ref 6.5–8.1)

## 2019-02-27 LAB — PREGNANCY, URINE: Preg Test, Ur: NEGATIVE

## 2019-02-27 LAB — CBC
HCT: 41.3 % (ref 36.0–46.0)
Hemoglobin: 13.6 g/dL (ref 12.0–15.0)
MCH: 30.7 pg (ref 26.0–34.0)
MCHC: 32.9 g/dL (ref 30.0–36.0)
MCV: 93.2 fL (ref 80.0–100.0)
Platelets: 328 10*3/uL (ref 150–400)
RBC: 4.43 MIL/uL (ref 3.87–5.11)
RDW: 12.8 % (ref 11.5–15.5)
WBC: 6.5 10*3/uL (ref 4.0–10.5)
nRBC: 0 % (ref 0.0–0.2)

## 2019-02-27 LAB — URINE DRUG SCREEN, QUALITATIVE (ARMC ONLY)
Amphetamines, Ur Screen: NOT DETECTED
Barbiturates, Ur Screen: NOT DETECTED
Benzodiazepine, Ur Scrn: NOT DETECTED
Cannabinoid 50 Ng, Ur ~~LOC~~: NOT DETECTED
Cocaine Metabolite,Ur ~~LOC~~: NOT DETECTED
MDMA (Ecstasy)Ur Screen: NOT DETECTED
Methadone Scn, Ur: NOT DETECTED
Opiate, Ur Screen: NOT DETECTED
Phencyclidine (PCP) Ur S: NOT DETECTED
Tricyclic, Ur Screen: NOT DETECTED

## 2019-02-27 LAB — VALPROIC ACID LEVEL: Valproic Acid Lvl: <10 ug/mL — ABNORMAL LOW (ref 50.0–100.0)

## 2019-02-27 LAB — ETHANOL: Alcohol, Ethyl (B): <10 mg/dL (ref <10)

## 2019-02-27 LAB — SALICYLATE LEVEL: Salicylate Lvl: <7 mg/dL (ref 2.8–30.0)

## 2019-02-27 LAB — ACETAMINOPHEN LEVEL: Acetaminophen (Tylenol), Serum: <10 ug/mL — ABNORMAL LOW (ref 10–30)

## 2019-02-27 MED ORDER — QUETIAPINE FUMARATE 25 MG PO TABS
150.0000 mg | ORAL_TABLET | Freq: Two times a day (BID) | ORAL | Status: DC
  Administered 2019-02-27 – 2019-03-01 (×5): 150 mg via ORAL
  Filled 2019-02-27 (×6): qty 6

## 2019-02-27 MED ORDER — ASPIRIN EC 81 MG PO TBEC
81.0000 mg | DELAYED_RELEASE_TABLET | Freq: Every day | ORAL | Status: DC
  Administered 2019-02-27 – 2019-03-01 (×3): 81 mg via ORAL
  Filled 2019-02-27 (×3): qty 1

## 2019-02-27 MED ORDER — LISINOPRIL 5 MG PO TABS
10.0000 mg | ORAL_TABLET | Freq: Every day | ORAL | Status: DC
  Administered 2019-02-27 – 2019-03-01 (×3): 10 mg via ORAL
  Filled 2019-02-27 (×2): qty 2
  Filled 2019-02-27: qty 1

## 2019-02-27 MED ORDER — TRAZODONE HCL 100 MG PO TABS
300.0000 mg | ORAL_TABLET | Freq: Every day | ORAL | Status: DC
  Administered 2019-02-28 – 2019-03-01 (×2): 300 mg via ORAL
  Filled 2019-02-27 (×3): qty 3

## 2019-02-27 MED ORDER — ALUM & MAG HYDROXIDE-SIMETH 200-200-20 MG/5ML PO SUSP: 30.0000 mL | Freq: Four times a day (QID) | ORAL | Status: DC | PRN

## 2019-02-27 MED ORDER — DIVALPROEX SODIUM 250 MG PO DR TAB
250.0000 mg | DELAYED_RELEASE_TABLET | Freq: Two times a day (BID) | ORAL | Status: DC
  Administered 2019-02-27 – 2019-03-01 (×4): 250 mg via ORAL
  Filled 2019-02-27 (×5): qty 1
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
## 2019-02-27 NOTE — ED Notes (Signed)
Snack given.

## 2019-02-27 NOTE — ED Notes (Signed)
Report received from [place] at [id number]. Orders reviewed. Pt did not wish to speak with this Probation officer and began cursing when this Probation officer introduced herself. She has sought assistance with the remote once. She remains cooperative but agitates easily. Will continue to monitor for needs/safety.

## 2019-02-27 NOTE — ED Notes (Signed)
ED BHU [place] Is the patient under IVC or is there intent for IVC: Yes.   Is the patient medically cleared: Yes.   Is there vacancy in the ED BHU: Yes.   Is the population mix appropriate for patient: Yes.   Is the patient awaiting placement in inpatient or outpatient setting: Yes.   Has the patient had a psychiatric consult: Yes.   Survey of unit performed for contraband, proper placement and condition of furniture, tampering with fixtures in bathroom, shower, and each patient room: Yes.  ; Findings:  APPEARANCE/BEHAVIOR Calm and cooperative NEURO ASSESSMENT Orientation: oriented x3  Denies pain Hallucinations: No.None noted (Hallucinations) Speech: Normal Gait: normal RESPIRATORY ASSESSMENT Even  Unlabored respirations  CARDIOVASCULAR ASSESSMENT Pulses equal   regular rate  Skin warm and dry   GASTROINTESTINAL ASSESSMENT no GI complaint EXTREMITIES Full ROM  PLAN OF CARE Provide calm/safe environment. Vital signs assessed twice daily. ED BHU Assessment once each 12-hour shift.  Assure the ED provider has rounded once each shift. Provide and encourage hygiene. Provide redirection as needed. Assess for escalating behavior; address immediately and inform ED provider.  Assess family dynamic and appropriateness for visitation as needed: Yes.  ; If necessary, describe findings:  Educate the patient/family about BHU procedures/visitation: Yes.  ; If necessary, describe findings:
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
## 2019-02-27 NOTE — ED Notes (Signed)
Pt dressed out and belongings placed in belongings bag
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
## 2019-02-27 NOTE — ED Notes (Signed)
IVC/Consult ordered/Moved to BHU-3
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
## 2019-02-27 NOTE — ED Notes (Signed)
Verified POA papers  Jenna Riggs (sister-in-law) [phone]

## 2019-02-27 NOTE — ED Notes (Signed)
Pt transferred into ED BHU room 3    Patient assigned to appropriate care area. Patient oriented to unit/care area: Informed that, for their safety, care areas are designed for safety and monitored by security cameras at all times;  phone times explained to patient. Patient verbalizes understanding, and verbal contract for safety obtained.   Assessment completed  She denies pain   

## 2019-02-27 NOTE — ED Notes (Signed)
Pt refused all medication, despite discussion and multiple prompts. It seems she did so because her 150 mg quetiapine dose was supplied in 25 mg tablets, and she decided she would take none. Encouraged her in multiple ways to take medication but she did not agree to do so.

## 2019-02-27 NOTE — ED Notes (Signed)
Patient again refused hs meds.

## 2019-02-27 NOTE — ED Notes (Signed)

## 2019-02-28 NOTE — ED Notes (Signed)
IVC/  PENDING  PLACEMENT 

## 2019-02-28 NOTE — ED Notes (Signed)
Pt legal guardian , [name], called at this time for update. RN gave update on pt status and still awaiting acceptance from an accepting facility. Legal guardian requesting [facility]. Kerry Dory, TTS  counselor made aware

## 2019-02-28 NOTE — ED Notes (Signed)
BEHAVIORAL HEALTH ROUNDING Patient sleeping: No. Patient alert and oriented: yes Behavior appropriate: Yes.  ; If no, describe:  Nutrition and fluids offered: yes Toileting and hygiene offered: Yes  Sitter present: q15 minute observations and security camera monitoring Law enforcement present: Yes  ODS  

## 2019-02-28 NOTE — ED Notes (Signed)
ED BHU PLACEMENT JUSTIFICATION Is the patient under IVC or is there intent for IVC: Yes.   Is the patient medically cleared: Yes.   Is there vacancy in the ED BHU: Yes.   Is the population mix appropriate for patient: Yes.   Is the patient awaiting placement in inpatient or outpatient setting: Yes.   Has the patient had a psychiatric consult: Yes.   Survey of unit performed for contraband, proper placement and condition of furniture, tampering with fixtures in bathroom, shower, and each patient room: Yes.  ; Findings:  APPEARANCE/BEHAVIOR Calm and cooperative NEURO ASSESSMENT Orientation: oriented x3  Denies pain Hallucinations: No.None noted (Hallucinations) denies Speech: Normal Gait: normal RESPIRATORY ASSESSMENT Even  Unlabored respirations  CARDIOVASCULAR ASSESSMENT Pulses equal   regular rate  Skin warm and dry   GASTROINTESTINAL ASSESSMENT no GI complaint EXTREMITIES Full ROM  PLAN OF CARE Provide calm/safe environment. Vital signs assessed twice daily. ED BHU Assessment once each 12-hour shift.  Assure the ED provider has rounded once each shift. Provide and encourage hygiene. Provide redirection as needed. Assess for escalating behavior; address immediately and inform ED provider.  Assess family dynamic and appropriateness for visitation as needed: Yes.  ; If necessary, describe findings:  Educate the patient/family about BHU procedures/visitation: Yes.  ; If necessary, describe findings:   

## 2019-02-28 NOTE — ED Notes (Signed)
Pt up to restroom, will continue to monitor

## 2019-02-28 NOTE — BH Assessment (Addendum)
Writer spoke with patient's guardian ([phone]) and informed her of the process of patient getting placed in a psych hospital for inpatient treatment. Also informed her that will continue to try for [facility] but will go with the first available bed. Sister/Guardian stated she understood.

## 2019-02-28 NOTE — ED Notes (Signed)
Pt upset because she can't use her own hygiene products. Pt cursing this nurse.

## 2019-02-28 NOTE — BH Assessment (Signed)
Referral information for Psychiatric Hospitalization faxed to;   [facility] [phone]),   . [facility] [phone])  . [facility] [phone] -or- [phone])   . [facility] ([phone]),  . [facility] [phone], [phone], [phone] or [phone]),   . [place] [phone] or [phone])  . [facility] [phone]),   . [place] [phone]),   . [facility] [phone] or [phone])  . [place] [phone] or [phone]),   . [facility] [phone]).  [facility] [phone])

## 2019-02-28 NOTE — ED Provider Notes (Signed)
-----------------------------------------   7:25 AM on 02/28/2019 -----------------------------------------   Blood pressure 98/63, pulse 64, temperature 97.9 F (36.6 C), temperature source Oral, resp. rate 17, SpO2 97 %.  The patient is calm and cooperative at this time.  There have been no acute events since the last update.  Awaiting disposition plan from Behavioral Medicine team.    Schuyler Amor, MD 02/28/19 [phone]
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
## 2019-02-28 NOTE — ED Notes (Signed)

## 2019-02-28 NOTE — ED Notes (Signed)
Offered pt meal tray, pt continued to sleep. Meal tray and ginger ale placed in pt rm.

## 2019-02-28 NOTE — ED Notes (Signed)
Pt provided breakfast tray at this time , continue to sleep

## 2019-02-28 NOTE — ED Notes (Signed)
Patient observed lying in bed with eyes closed  Even, unlabored respirations observed   NAD pt appears to be sleeping  I will continue to monitor along with every 15 minute visual observations and ongoing security camera monitoring    

## 2019-03-01 ENCOUNTER — Emergency Department: Payer: Medicare HMO

## 2019-03-01 ENCOUNTER — Inpatient Hospital Stay
Admission: AD | Admit: 2019-03-01 | Discharge: 2019-03-11 | DRG: 885 | Disposition: A | Discharge status: Home / Self Care | Payer: Medicare HMO | Attending: Psychiatry | Admitting: Psychiatry

## 2019-03-01 ENCOUNTER — Other Ambulatory Visit: Payer: Self-pay

## 2019-03-01 DIAGNOSIS — Z7982 Long term (current) use of aspirin: Secondary | ICD-10-CM

## 2019-03-01 DIAGNOSIS — F17210 Nicotine dependence, cigarettes, uncomplicated: Secondary | ICD-10-CM | POA: Diagnosis present

## 2019-03-01 DIAGNOSIS — F71 Moderate intellectual disabilities: Secondary | ICD-10-CM | POA: Diagnosis present

## 2019-03-01 DIAGNOSIS — F4325 Adjustment disorder with mixed disturbance of emotions and conduct: Secondary | ICD-10-CM

## 2019-03-01 DIAGNOSIS — G4700 Insomnia, unspecified: Secondary | ICD-10-CM | POA: Diagnosis present

## 2019-03-01 DIAGNOSIS — R45850 Homicidal ideations: Secondary | ICD-10-CM | POA: Diagnosis present

## 2019-03-01 DIAGNOSIS — R45851 Suicidal ideations: Secondary | ICD-10-CM | POA: Diagnosis present

## 2019-03-01 DIAGNOSIS — Z9114 Patient's other noncompliance with medication regimen: Secondary | ICD-10-CM | POA: Diagnosis not present

## 2019-03-01 DIAGNOSIS — F3163 Bipolar disorder, current episode mixed, severe, without psychotic features: Secondary | ICD-10-CM | POA: Diagnosis present

## 2019-03-01 DIAGNOSIS — Z833 Family history of diabetes mellitus: Secondary | ICD-10-CM

## 2019-03-01 DIAGNOSIS — Z981 Arthrodesis status: Secondary | ICD-10-CM

## 2019-03-01 DIAGNOSIS — I10 Essential (primary) hypertension: Secondary | ICD-10-CM | POA: Diagnosis present

## 2019-03-01 DIAGNOSIS — F22 Delusional disorders: Secondary | ICD-10-CM | POA: Diagnosis not present

## 2019-03-01 LAB — SARS CORONAVIRUS 2 BY RT PCR (HOSPITAL ORDER, PERFORMED IN ~~LOC~~ HOSPITAL LAB): SARS Coronavirus 2: NEGATIVE

## 2019-03-01 MED ORDER — ASPIRIN EC 81 MG PO TBEC
81.0000 mg | DELAYED_RELEASE_TABLET | Freq: Every day | ORAL | Status: DC
  Administered 2019-03-02 – 2019-03-11 (×10): 81 mg via ORAL
  Filled 2019-03-01 (×10): qty 1

## 2019-03-01 MED ORDER — QUETIAPINE FUMARATE 25 MG PO TABS
150.0000 mg | ORAL_TABLET | Freq: Two times a day (BID) | ORAL | Status: DC
  Administered 2019-03-02: 08:00:00 150 mg via ORAL
  Filled 2019-03-01: qty 1

## 2019-03-01 MED ORDER — TRAZODONE HCL 100 MG PO TABS
300.0000 mg | ORAL_TABLET | Freq: Every day | ORAL | Status: DC
  Administered 2019-03-02 – 2019-03-10 (×9): 300 mg via ORAL
  Filled 2019-03-01 (×9): qty 3

## 2019-03-01 MED ORDER — ALUM & MAG HYDROXIDE-SIMETH 200-200-20 MG/5ML PO SUSP: 30.0000 mL | Freq: Four times a day (QID) | ORAL | Status: DC | PRN

## 2019-03-01 MED ORDER — LISINOPRIL 20 MG PO TABS
10.0000 mg | ORAL_TABLET | Freq: Every day | ORAL | Status: DC
  Administered 2019-03-02 – 2019-03-11 (×9): 10 mg via ORAL
  Filled 2019-03-01 (×10): qty 1

## 2019-03-01 MED ORDER — ACETAMINOPHEN 325 MG PO TABS
650.0000 mg | ORAL_TABLET | Freq: Four times a day (QID) | ORAL | Status: DC | PRN
  Administered 2019-03-04: 650 mg via ORAL
  Filled 2019-03-01: qty 2

## 2019-03-01 MED ORDER — DIVALPROEX SODIUM 500 MG PO DR TAB
500.0000 mg | DELAYED_RELEASE_TABLET | Freq: Two times a day (BID) | ORAL | Status: DC
  Administered 2019-03-01: 500 mg via ORAL
  Filled 2019-03-01: qty 1

## 2019-03-01 MED ORDER — DIVALPROEX SODIUM 500 MG PO DR TAB
500.0000 mg | DELAYED_RELEASE_TABLET | Freq: Two times a day (BID) | ORAL | Status: DC
  Administered 2019-03-02 – 2019-03-11 (×19): 500 mg via ORAL
  Filled 2019-03-01 (×19): qty 1

## 2019-03-01 MED ORDER — MAGNESIUM HYDROXIDE 400 MG/5ML PO SUSP: 30.0000 mL | Freq: Every day | ORAL | Status: DC | PRN

## 2019-03-01 MED ORDER — ALUM & MAG HYDROXIDE-SIMETH 200-200-20 MG/5ML PO SUSP: 30.0000 mL | ORAL | Status: DC | PRN

## 2019-03-01 NOTE — ED Notes (Signed)
Pt provided with meal tray and ginger ale to drink, pt sitting up in bed at this time eating meal tray with no further needs or concerns 
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
## 2019-03-01 NOTE — ED Notes (Signed)
Pt up to nurses station requesting a drink, pt provided with ginger ale

## 2019-03-01 NOTE — ED Provider Notes (Signed)
Writer spoke with patient's guardian ([phone]) and informed her that the of patient will be admitted here at [facility] for inpatient treatment.

## 2019-03-01 NOTE — ED Notes (Signed)
Patient went for chest xray.

## 2019-03-01 NOTE — Tx Team (Signed)
Initial Treatment Plan 03/01/2019 10:54 PM Jenna Riggs MIW:[id number]    PATIENT STRESSORS: Marital or family conflict   PATIENT STRENGTHS: Religious Affiliation Supportive family/friends   PATIENT IDENTIFIED PROBLEMS:     psychosis                 DISCHARGE CRITERIA:  Ability to meet basic life and health needs Adequate post-discharge living arrangements Improved stabilization in mood, thinking, and/or behavior Medical problems require only outpatient monitoring Motivation to continue treatment in a less acute level of care Need for constant or close observation no longer present Reduction of life-threatening or endangering symptoms to within safe limits Safe-care adequate arrangements made Verbal commitment to aftercare and medication compliance  PRELIMINARY DISCHARGE PLAN: Outpatient therapy Return to previous living arrangement  PATIENT/FAMILY INVOLVEMENT: This treatment plan has been presented to and reviewed with the patient, Jenna Riggs, and/or family member.  The patient and family have been given the opportunity to ask questions and make suggestions.  Libby Maw, RN 03/01/2019, 10:54 PM
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
## 2019-03-01 NOTE — ED Notes (Signed)
Pt. Waiting to go downstairs to BMU unit.  Pt. Currently sleeping in bed.

## 2019-03-01 NOTE — BH Assessment (Addendum)
Spoke to [facility] BMU Attending Physician (Dr. Weber Cooks) about patient and patient to be admitted to the Unit. Nurse Practitioner [place] Will put in admission orders. Attending Physician will be Dr. Weber Cooks.   Patient has been assigned to room 320, by [place].   ER staff is aware of the admission:  Nitchia, ER Secretary    Lynnell Grain., Patient's Nurse   Elberta Fortis, Patient Access.

## 2019-03-01 NOTE — ED Provider Notes (Signed)
-----------------------------------------   6:24 AM on 03/01/2019 -----------------------------------------   Blood pressure 135/76, pulse 83, temperature 98.2 F (36.8 C), temperature source Oral, resp. rate 16, SpO2 100 %.  The patient is calm and cooperative at this time.  There have been no acute events since the last update.  Awaiting disposition plan from Behavioral Medicine team.   Paulette Blanch, MD 03/01/19 [phone]

## 2019-03-01 NOTE — ED Notes (Signed)
Patient up to bathroom, ask for drink, no signs of distress, will continue to monitor.

## 2019-03-01 NOTE — ED Notes (Signed)
Patient refuses her medications, covid test, and ekg at this time, nurse will attempt again soon to administer, Patient is safe at this time, she is easily agitated, will continue to monitor.
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
## 2019-03-02 MED ORDER — QUETIAPINE FUMARATE 100 MG PO TABS
100.0000 mg | ORAL_TABLET | Freq: Three times a day (TID) | ORAL | Status: DC
  Administered 2019-03-02 – 2019-03-07 (×16): 100 mg via ORAL
  Filled 2019-03-02 (×16): qty 1

## 2019-03-02 MED ORDER — PERPHENAZINE 4 MG PO TABS
4.0000 mg | ORAL_TABLET | Freq: Every day | ORAL | Status: DC
  Administered 2019-03-02: 22:00:00 4 mg via ORAL
  Filled 2019-03-02: qty 1

## 2019-03-02 NOTE — BHH Suicide Risk Assessment (Signed)
[facility] Admission Suicide Risk Assessment   Nursing information obtained from:  Patient Demographic factors:  Caucasian, Unemployed, Low socioeconomic status Current Mental Status:  Suicidal ideation indicated by others, Thoughts of violence towards others Loss Factors:  NA Historical Factors:  Prior suicide attempts, Impulsivity Risk Reduction Factors:  Religious beliefs about death  Total Time spent with patient: 45 minutes Principal Problem: Exacerbation of bipolar disorder Diagnosis:  Active Problems:   Bipolar affective disorder, mixed, severe ([place])  Subjective Data: Petition by family due to agitation and threats and volatility.  Stating she "never wants to go back there"  Continued Clinical Symptoms:  Alcohol Use Disorder Identification Test Final Score (AUDIT): 0 The "Alcohol Use Disorders Identification Test", Guidelines for Use in Primary Care, Second Edition.  World Pharmacologist [facility]). Score between 0-7:  no or low risk or alcohol related problems. Score between 8-15:  moderate risk of alcohol related problems. Score between 16-19:  high risk of alcohol related problems. Score 20 or above:  warrants further diagnostic evaluation for alcohol dependence and treatment.   CLINICAL FACTORS:   Bipolar Disorder:   Mixed State   Musculoskeletal: Strength & Muscle Tone: within normal limits Gait & Station: normal Patient leans: N/A  Psychiatric Specialty Exam: Physical Exam no acute medical ailments/labs reviewed nursing notes reviewed vital signs reviewed  ROS denies hypertension/denies chest pain/denies head injury seizures/denies GI GU issues  Blood pressure 131/86, pulse 66, temperature 97.9 F (36.6 C), temperature source Oral, resp. rate 18, height 5\' 3"  (1.6 m), weight 101.2 kg, SpO2 100 %.Body mass index is 39.5 kg/m.  General Appearance: Disheveled  Eye Contact:  Good  Speech:  Pressured  Volume:  Increased  Mood:  hypomanic-labile at intervals  Affect:   Congruent  Thought Process:  Linear and Descriptions of Associations: Loose  Orientation:  Full (Time, Place, and Person)  Thought Content:  Illogical and Delusions  Suicidal Thoughts:  No  Homicidal Thoughts:  Yes.  without intent/plan  Memory:  Immediate;   Fair  Judgement:  Impaired  Insight:  Lacking  Psychomotor Activity:  Normal  Concentration:  Concentration: Poor  Recall:  Poor  Fund of Knowledge:  Poor  Language:  Poor  Akathisia:  Negative  Handed:  Right  AIMS (if indicated):     Assets:  Leisure Time Physical Health Resilience Social Support  ADL's:  Intact  Cognition:  WNL  Sleep:         COGNITIVE FEATURES THAT CONTRIBUTE TO RISK:  Loss of executive function    SUICIDE RISK:   Mild:  Suicidal ideation of limited frequency, intensity, duration, and specificity.  There are no identifiable plans, no associated intent, mild dysphoria and related symptoms, good self-control (both objective and subjective assessment), few other risk factors, and identifiable protective factors, including available and accessible social support.  PLAN OF CARE: see eval  I certify that inpatient services furnished can reasonably be expected to improve the patient's condition.   [name], MD 03/02/2019, 9:05 AM

## 2019-03-02 NOTE — Plan of Care (Signed)
Visible and active in the milieu. Cooperative and engaged in group activities. Pleasant. Denying thoughts of self harm. Denying hallucinations.  Reports that she slept and is eating well. No sign of distress. Support and encouragements provided. Safety monitored per unit routine.
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
## 2019-03-02 NOTE — [facility] Group Notes (Signed)
LCSW Group Therapy Note   03/02/2019 1:15pm   Type of Therapy and Topic:  Group Therapy:  Trust and Honesty  Participation Level:  Active  Description of Group:    In this group patients will be asked to explore the value of being honest.  Patients will be guided to discuss their thoughts, feelings, and behaviors related to honesty and trusting in others. Patients will process together how trust and honesty relate to forming relationships with peers, family members, and self. Each patient will be challenged to identify and express feelings of being vulnerable. Patients will discuss reasons why people are dishonest and identify alternative outcomes if one was truthful (to self or others). This group will be process-oriented, with patients participating in exploration of their own experiences, giving and receiving support, and processing challenge from other group members.   Therapeutic Goals: 1. Patient will identify why honesty is important to relationships and how honesty overall affects relationships.  2. Patient will identify a situation where they lied or were lied too and the  feelings, thought process, and behaviors surrounding the situation 3. Patient will identify the meaning of being vulnerable, how that feels, and how that correlates to being honest with self and others. 4. Patient will identify situations where they could have told the truth, but instead lied and explain reasons of dishonesty.   Summary of Patient Progress The patient was able to explore the value of being honest.  Patient discussed thoughts, feelings, and behaviors related to honesty and trusting in others. The patient processed together with other group members how trust and honesty relate to forming relationships with peers, family members, and self. Pt actively and appropriately engaged in the group. Patient was able to provide support and validation to other group members. Patient practiced active listening when  interacting with the facilitator and other group members.    Therapeutic Modalities:   Cognitive Behavioral Therapy Solution Focused Therapy Motivational Interviewing Brief Therapy  Zairah Arista  CUEBAS-COLON, LCSW 03/02/2019 12:38 PM  

## 2019-03-03 MED ORDER — PERPHENAZINE 4 MG PO TABS
6.0000 mg | ORAL_TABLET | Freq: Every day | ORAL | Status: DC
  Administered 2019-03-03 – 2019-03-06 (×4): 6 mg via ORAL
  Filled 2019-03-03 (×5): qty 2

## 2019-03-03 NOTE — Progress Notes (Signed)
[facility]  03/03/2019 9:28 AM Jenna AblesLinda Riggs  MRN:  [id number] Subjective:   Patient in the day room she continues to be a little bit pressured and hypomanic and insisting she will "never leave the hospital" wanting to stay here indefinitely or have new housing arrangements continues to focus on her brother and the volatility at home.  Denies wanting to harm self today denies wanting to harm brother or sister-in-law.  Again very focused on housing/staying here so forth but again hypomanic and pressured Principal Problem: Exacerbation of underlying bipolar condition with paranoia and volatility Diagnosis: Active Problems:   Bipolar affective disorder, mixed, severe (HCC)  Total Time spent with patient: 20 minutes  Past Psychiatric History: extensive  Past Medical History:  Past Medical History:  Diagnosis Date  . Mental retardation   . Renal disorder   . Seizures (HCC)     Past Surgical History:  Procedure Laterality Date  . ANKLE FUSION Right    Family History:  Family History  Problem Relation Age of Onset  . Diabetes Mother   . Diabetes Father    Family Psychiatric  History: no new data shared Social History:  Social History   Substance and Sexual Activity  Alcohol Use No     Social History   Substance and Sexual Activity  Drug Use No    Social History   Socioeconomic History  . Marital status: Single    Spouse name: Not on file  . Number of children: Not on file  . Years of education: Not on file  . Highest education level: Not on file  Occupational History  . Not on file  Social Needs  . Financial resource strain: Not very hard  . Food insecurity    Worry: Patient refused    Inability: Patient refused  . Transportation needs    Medical: Patient refused    Non-medical: Patient refused  Tobacco Use  . Smoking status: Current Every Day Smoker    Packs/day: 1.00    Years: 35.00    Pack years: 35.00    Types: Cigarettes  . Smokeless tobacco:  Never Used  . Tobacco comment: Patient refused  Substance and Sexual Activity  . Alcohol use: No  . Drug use: No  . Sexual activity: Never    Birth control/protection: None  Lifestyle  . Physical activity    Days per week: Patient refused    Minutes per session: Patient refused  . Stress: To some extent  Relationships  . Social Musicianconnections    Talks on phone: Patient refused    Gets together: Patient refused    Attends religious service: Patient refused    Active member of club or organization: Patient refused    Attends meetings of clubs or organizations: Patient refused    Relationship status: Patient refused  Other Topics Concern  . Not on file  Social History Narrative  . Not on file   Additional Social History:                         Sleep: Fair  Appetite:  Fair  Current Medications: Current Facility-Administered Medications  Medication Dose Route Frequency Provider Last Rate Last Dose  . acetaminophen (TYLENOL) tablet 650 mg  650 mg Oral Q6H PRN Charm RingsLord, Jamison Y, NP      . alum & mag hydroxide-simeth (MAALOX/MYLANTA) 200-200-20 MG/5ML suspension 30 mL  30 mL Oral Q4H PRN Charm RingsLord, Jamison Y, NP      .  aspirin EC tablet 81 mg  81 mg Oral Daily [name], NP   81 mg at 03/03/19 6629  . divalproex (DEPAKOTE) DR tablet 500 mg  500 mg Oral BID [name], NP   500 mg at 03/03/19 4765  . lisinopril (ZESTRIL) tablet 10 mg  10 mg Oral Daily [name], NP   10 mg at 03/03/19 4650  . magnesium hydroxide (MILK OF MAGNESIA) suspension 30 mL  30 mL Oral Daily PRN [name], NP      . perphenazine (TRILAFON) tablet 6 mg  6 mg Oral QHS [name], MD      . QUEtiapine (SEROQUEL) tablet 100 mg  100 mg Oral TID [name], MD   100 mg at 03/03/19 0906  . traZODone (DESYREL) tablet 300 mg  300 mg Oral QHS [name], NP   300 mg at 03/02/19 2142    Lab Results:  Results for orders placed or performed during the hospital encounter of 02/27/19  (from the past 48 hour(s))  SARS Coronavirus 2 (CEPHEID - Performed in [place] hospital lab), Hosp Order     Status: None   Collection Time: 03/01/19 10:57 AM   Specimen: Nasopharyngeal Swab  Result Value Ref Range   SARS Coronavirus 2 NEGATIVE NEGATIVE    Comment: (NOTE) If result is NEGATIVE SARS-CoV-2 target nucleic acids are NOT DETECTED. The SARS-CoV-2 RNA is generally detectable in upper and lower  respiratory specimens during the acute phase of infection. The lowest  concentration of SARS-CoV-2 viral copies this assay can detect is 250  copies / mL. A negative result does not preclude SARS-CoV-2 infection  and should not be used as the sole basis for treatment or other  patient management decisions.  A negative result may occur with  improper specimen collection / handling, submission of specimen other  than nasopharyngeal swab, presence of viral mutation(s) within the  areas targeted by this assay, and inadequate number of viral copies  (<250 copies / mL). A negative result must be combined with clinical  observations, patient history, and epidemiological information. If result is POSITIVE SARS-CoV-2 target nucleic acids are DETECTED. The SARS-CoV-2 RNA is generally detectable in upper and lower  respiratory specimens dur ing the acute phase of infection.  Positive  results are indicative of active infection with SARS-CoV-2.  Clinical  correlation with patient history and other diagnostic information is  necessary to determine patient infection status.  Positive results do  not rule out bacterial infection or co-infection with other viruses. If result is PRESUMPTIVE POSTIVE SARS-CoV-2 nucleic acids MAY BE PRESENT.   A presumptive positive result was obtained on the submitted specimen  and confirmed on repeat testing.  While 2019 novel coronavirus  (SARS-CoV-2) nucleic acids may be present in the submitted sample  additional confirmatory testing may be necessary for  epidemiological  and / or clinical management purposes  to differentiate between  SARS-CoV-2 and other Sarbecovirus currently known to infect humans.  If clinically indicated additional testing with an alternate test  methodology [phone]) is advised. The SARS-CoV-2 RNA is generally  detectable in upper and lower respiratory sp ecimens during the acute  phase of infection. The expected result is Negative. Fact Sheet for Patients:  StrictlyIdeas.no Fact Sheet for Healthcare Providers: BankingDealers.co.za This test is not yet approved or cleared by the [place] FDA and has been authorized for detection and/or diagnosis of SARS-CoV-2 by FDA under an Emergency Use Authorization (EUA).  This EUA  will remain in effect (meaning this test can be used) for the duration of the COVID-19 declaration under Section 564(b)(1) of the Act, 21 U.S.C. section 360bbb-3(b)(1), unless the authorization is terminated or revoked sooner. Performed at [facility], [address] [place], [place] [address]     Blood Alcohol level:  Lab Results  Component Value Date   [facility] 02/27/2019   ETH <10 11/12/2018    Metabolic Disorder Labs: Lab Results  Component Value Date   HGBA1C 5.3 09/19/2016   MPG 105 09/19/2016   Lab Results  Component Value Date   PROLACTIN 12.8 09/19/2016   Lab Results  Component Value Date   CHOL 144 09/19/2016   TRIG 123 09/19/2016   HDL 44 09/19/2016   CHOLHDL 3.3 09/19/2016   VLDL 25 09/19/2016   LDLCALC 75 09/19/2016   LDLCALC 76 02/14/2015    Physical Findings: AIMS: Facial and Oral Movements Muscles of Facial Expression: None, normal Lips and Perioral Area: None, normal Jaw: None, normal Tongue: None, normal,Extremity Movements Upper (arms, wrists, hands, fingers): None, normal Lower (legs, knees, ankles, toes): None, normal, Trunk Movements Neck, shoulders, hips: None, normal, Overall  Severity Severity of abnormal movements (highest score from questions above): None, normal Incapacitation due to abnormal movements: None, normal Patient's awareness of abnormal movements (rate only patient's report): No Awareness, Dental Status Current problems with teeth and/or dentures?: No Does patient usually wear dentures?: No  CIWA:    COWS:     Musculoskeletal: Strength & Muscle Tone: within normal limits Gait & Station: normal Patient leans: N/A  Psychiatric Specialty Exam: Physical Exam  ROS  Blood pressure 119/79, pulse 77, temperature 98 F (36.7 C), temperature source Oral, resp. rate 18, height 5\' 3"  (1.6 m), weight 101.2 kg, SpO2 100 %.Body mass index is 39.5 kg/m.  General Appearance: Casual and Disheveled  Eye Contact:  Fair  Speech:  Pressured  Volume:  Increased  Mood:  hypomanic  Affect:  Congruent and Constricted  Thought Process:  Irrelevant and Descriptions of Associations: Loose  Orientation:  Full (Time, Place, and Person)  Thought Content:  Illogical, Delusions and Paranoid Ideation  Suicidal Thoughts:  No  Homicidal Thoughts:  No  Memory:  Immediate;   Fair  Judgement:  Poor  Insight:  Shallow  Psychomotor Activity:  Normal  Concentration:  Concentration: Fair  Recall:  FiservFair  Fund of Knowledge:  Fair  Language:  Fair  Akathisia:  Negative  Handed:  Right  AIMS (if indicated):     Assets:  Physical Health Resilience Social Support  ADL's:  Intact  Cognition:  WNL  Sleep:        Treatment Plan Summary: Daily contact with patient to assess and evaluate symptoms and progress in treatment, Medication management and Plan Continue current cognitive and reality based therapies, continue to monitor for safety, escalate perphenazine at night for improved mood stabilization continue quetiapine in the daytime to help with volatility and paranoia, again continue reality-based therapy  Meshulem Onorato, MD 03/03/2019, 9:28 AM

## 2019-03-03 NOTE — Plan of Care (Signed)
  Problem: Education: Goal: Knowledge of [facility] Education information/materials will improve Outcome: Not Progressing Goal: Emotional status will improve Outcome: Not Progressing Goal: Mental status will improve Outcome: Not Progressing Goal: Verbalization of understanding the information provided will improve Outcome: Not Progressing  D: Patient has been present out on the unit. Interacting appropriately with peers. Stated she went to group and found group to be helpful. Denies SI, HI and AVH. Continues to ask for staff to call her family for her and ask them to bring her long pants, because she does not want to talk with them. No outbursts. Affect and mood are anxious. Contracting for safety. A: Continue to monitor for safety. R: Safety maintained.

## 2019-03-03 NOTE — Progress Notes (Signed)
D: Patient has been present out on the unit. Interacting appropriately with peers. Stated she went to group and found group to be helpful. Denies SI, HI and AVH. Continues to ask for staff to call her family for her and ask them to bring her long pants, because she does not want to talk with them. No outbursts. Affect and mood are anxious. Contracting for safety. A: Continue to monitor for safety. R: Safety maintained.

## 2019-03-03 NOTE — Plan of Care (Signed)
D- Patient alert and oriented. Patient presents in a pleasant mood on assessment stating that she slept "good" last night and had no major complaints or concerns to voice to this Probation officer. Patient denies any signs/symptoms of depression/anxiety stating that overall she is feeling "good". Patient also denies SI, HI, AVH, and pain at this time. Patient's goal for today is to "keep going to group".  A- Scheduled medications administered to patient, per MD orders. Support and encouragement provided.  Routine safety checks conducted every 15 minutes.  Patient informed to notify staff with problems or concerns.  R- No adverse drug reactions noted. Patient contracts for safety at this time. Patient compliant with medications and treatment plan. Patient receptive, calm, and cooperative. Patient interacts well with others on the unit.  Patient remains safe at this time.  Problem: Education: Goal: Knowledge of [facility] Education information/materials will improve Outcome: Progressing Goal: Emotional status will improve Outcome: Progressing Goal: Mental status will improve Outcome: Progressing Goal: Verbalization of understanding the information provided will improve Outcome: Progressing   Problem: Activity: Goal: Interest or engagement in activities will improve Outcome: Progressing Goal: Sleeping patterns will improve Outcome: Progressing   Problem: Coping: Goal: Ability to verbalize frustrations and anger appropriately will improve Outcome: Progressing Goal: Ability to demonstrate self-control will improve Outcome: Progressing   Problem: Health Behavior/Discharge Planning: Goal: Identification of resources available to assist in meeting health care needs will improve Outcome: Progressing Goal: Compliance with treatment plan for underlying cause of condition will improve Outcome: Progressing   Problem: Physical Regulation: Goal: Ability to maintain clinical measurements within  normal limits will improve Outcome: Progressing   Problem: Safety: Goal: Periods of time without injury will increase Outcome: Progressing   Problem: Activity: Goal: Will verbalize the importance of balancing activity with adequate rest periods Outcome: Progressing   Problem: Education: Goal: Will be free of psychotic symptoms Outcome: Progressing Goal: Knowledge of the prescribed therapeutic regimen will improve Outcome: Progressing   Problem: Coping: Goal: Coping ability will improve Outcome: Progressing Goal: Will verbalize feelings Outcome: Progressing   Problem: Health Behavior/Discharge Planning: Goal: Compliance with prescribed medication regimen will improve Outcome: Progressing   Problem: Nutritional: Goal: Ability to achieve adequate nutritional intake will improve Outcome: Progressing   Problem: Role Relationship: Goal: Ability to communicate needs accurately will improve Outcome: Progressing Goal: Ability to interact with others will improve Outcome: Progressing   Problem: Safety: Goal: Ability to redirect hostility and anger into socially appropriate behaviors will improve Outcome: Progressing Goal: Ability to remain free from injury will improve Outcome: Progressing   Problem: Self-Care: Goal: Ability to participate in self-care as condition permits will improve Outcome: Progressing   Problem: Self-Concept: Goal: Will verbalize positive feelings about self Outcome: Progressing
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
## 2019-03-03 NOTE — [facility] Group Notes (Signed)
LCSW Group Therapy Note 03/03/2019 1:15pm  Type of Therapy and Topic: Group Therapy: Feelings Around Returning Home & Establishing a Supportive Framework and Supporting Oneself When Supports Not Available  Participation Level: Active  Description of Group:  Patients first processed thoughts and feelings about upcoming discharge. These included fears of upcoming changes, lack of change, new living environments, judgements and expectations from others and overall stigma of mental health issues. The group then discussed the definition of a supportive framework, what that looks and feels like, and how do to discern it from an unhealthy non-supportive network. The group identified different types of supports as well as what to do when your family/friends are less than helpful or unavailable  Therapeutic Goals  1. Patient will identify one healthy supportive network that they can use at discharge. 2. Patient will identify one factor of a supportive framework and how to tell it from an unhealthy network. 3. Patient able to identify one coping skill to use when they do not have positive supports from others. 4. Patient will demonstrate ability to communicate their needs through discussion and/or role plays.  Summary of Patient Progress:  The patient reported she feels "good." Pt engaged during group session. As patients processed their anxiety about discharge and described healthy supports patient shared she does not want to go back home to her brother. Member listed her family in [place] as her main support. Patients identified at least one self-care tool they were willing to use after discharge.   Therapeutic Modalities Cognitive Behavioral Therapy Motivational Interviewing   Jenna Riggs  CUEBAS-COLON, LCSW 03/03/2019 12:45 PM

## 2019-03-04 DIAGNOSIS — F3163 Bipolar disorder, current episode mixed, severe, without psychotic features: Principal | ICD-10-CM
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
## 2019-03-04 NOTE — Progress Notes (Signed)
Recreation Therapy Notes  Date: 03/04/2019  Time: 9:30 am   Location: Craft room   Behavioral response: N/A   Intervention Topic: Stress  Discussion/Intervention: Patient did not attend group.   Clinical Observations/Feedback:  Patient did not attend group.   Ashur Glatfelter LRT/CTRS        Jameil Whitmoyer 03/04/2019 10:30 AM

## 2019-03-04 NOTE — Progress Notes (Signed)
D: Patient has been appropriate on the unit. Denies SI, HI and AVH. Wants social work to tell the doctor not to send her home because she does not want to be with her family. Mood is anxious. Affect is appropriate to circumstance. A: Continue to monitor for safety. R: Safety maintained.

## 2019-03-04 NOTE — Tx Team (Addendum)
Interdisciplinary Treatment and Diagnostic Plan Update  03/04/2019 Time of Session: 230p Jenna Riggs MRN: [id number]  Principal Diagnosis: <principal problem not specified>  Secondary Diagnoses: Active Problems:   Bipolar affective disorder, mixed, severe (HCC)   Current Medications:  Current Facility-Administered Medications  Medication Dose Route Frequency Provider Last Rate Last Dose  . acetaminophen (TYLENOL) tablet 650 mg  650 mg Oral Q6H PRN [name], NP   650 mg at 03/04/19 2409  . alum & mag hydroxide-simeth (MAALOX/MYLANTA) 200-200-20 MG/5ML suspension 30 mL  30 mL Oral Q4H PRN [name], NP      . aspirin EC tablet 81 mg  81 mg Oral Daily [name], NP   81 mg at 03/04/19 7353  . divalproex (DEPAKOTE) DR tablet 500 mg  500 mg Oral BID [name], NP   500 mg at 03/04/19 2992  . lisinopril (ZESTRIL) tablet 10 mg  10 mg Oral Daily [name], NP   10 mg at 03/04/19 4268  . magnesium hydroxide (MILK OF MAGNESIA) suspension 30 mL  30 mL Oral Daily PRN [name], NP      . perphenazine (TRILAFON) tablet 6 mg  6 mg Oral QHS [name], MD   6 mg at 03/03/19 2134  . QUEtiapine (SEROQUEL) tablet 100 mg  100 mg Oral TID [name], MD   100 mg at 03/04/19 1207  . traZODone (DESYREL) tablet 300 mg  300 mg Oral QHS [name], NP   300 mg at 03/03/19 2134   PTA Medications: Medications Prior to Admission  Medication Sig Dispense Refill Last Dose  . aspirin EC 81 MG tablet Take 81 mg by mouth daily.     . divalproex (DEPAKOTE ER) 250 MG 24 hr tablet Take 3 tablets (750 mg total) by mouth daily at 6 PM. (Patient not taking: Reported on 11/12/2018) 90 tablet 0   . divalproex (DEPAKOTE) 250 MG DR tablet Take 1 tablet by mouth 2 (two) times daily.     . divalproex (DEPAKOTE) 500 MG DR tablet Take 1 tablet (500 mg total) by mouth every morning. (Patient not taking: Reported on 11/12/2018) 30 tablet 0   . lisinopril (PRINIVIL,ZESTRIL) 10 MG tablet Take  10 mg by mouth daily.     . QUEtiapine (SEROQUEL) 100 MG tablet Take 1 tablet (100 mg total) by mouth 2 (two) times daily. (Patient taking differently: Take 150 mg by mouth 2 (two) times daily. ) 60 tablet 1   . traZODone (DESYREL) 100 MG tablet Take 300 mg by mouth at bedtime.     . vitamin B-12 1000 MCG tablet Take 1 tablet (1,000 mcg total) by mouth daily. (Patient not taking: Reported on 02/28/2019) 30 tablet 1     Patient Stressors: Marital or family conflict  Patient Strengths: Religious Affiliation Supportive family/friends  Treatment Modalities: Medication Management, Group therapy, Case management,  1 to 1 session with clinician, Psychoeducation, Recreational therapy.   Physician Treatment Plan for Primary Diagnosis: <principal problem not specified> Long Term Goal(s): Improvement in symptoms so as ready for discharge Improvement in symptoms so as ready for discharge   Short Term Goals: Ability to disclose and discuss suicidal ideas Ability to demonstrate self-control will improve Ability to identify and develop effective coping behaviors will improve Ability to demonstrate self-control will improve Ability to identify and develop effective coping behaviors will improve Ability to maintain clinical measurements within normal limits will improve Compliance with prescribed medications will improve  Medication  Management: Evaluate patient's response, side effects, and tolerance of medication regimen.  Therapeutic Interventions: 1 to 1 sessions, Unit Group sessions and Medication administration.  Evaluation of Outcomes: Not Met  Physician Treatment Plan for Secondary Diagnosis: Active Problems:   Bipolar affective disorder, mixed, severe (Jenna Riggs)  Long Term Goal(s): Improvement in symptoms so as ready for discharge Improvement in symptoms so as ready for discharge   Short Term Goals: Ability to disclose and discuss suicidal ideas Ability to demonstrate self-control will  improve Ability to identify and develop effective coping behaviors will improve Ability to demonstrate self-control will improve Ability to identify and develop effective coping behaviors will improve Ability to maintain clinical measurements within normal limits will improve Compliance with prescribed medications will improve     Medication Management: Evaluate patient's response, side effects, and tolerance of medication regimen.  Therapeutic Interventions: 1 to 1 sessions, Unit Group sessions and Medication administration.  Evaluation of Outcomes: Not Met   RN Treatment Plan for Primary Diagnosis: <principal problem not specified> Long Term Goal(s): Knowledge of disease and therapeutic regimen to maintain health will improve  Short Term Goals: Ability to demonstrate self-control, Ability to participate in decision making will improve, Ability to verbalize feelings will improve, Ability to identify and develop effective coping behaviors will improve and Compliance with prescribed medications will improve  Medication Management: RN will administer medications as ordered by provider, will assess and evaluate patient's response and provide education to patient for prescribed medication. RN will report any adverse and/or side effects to prescribing provider.  Therapeutic Interventions: 1 on 1 counseling sessions, Psychoeducation, Medication administration, Evaluate responses to treatment, Monitor vital signs and CBGs as ordered, Perform/monitor CIWA, COWS, AIMS and Fall Risk screenings as ordered, Perform wound care treatments as ordered.  Evaluation of Outcomes: Not Met   LCSW Treatment Plan for Primary Diagnosis: <principal problem not specified> Long Term Goal(s): Safe transition to appropriate next level of care at discharge, Engage patient in therapeutic group addressing interpersonal concerns.  Short Term Goals: Engage patient in aftercare planning with referrals and  resources  Therapeutic Interventions: Assess for all discharge needs, 1 to 1 time with Social worker, Explore available resources and support systems, Assess for adequacy in community support network, Educate family and significant other(s) on suicide prevention, Complete Psychosocial Assessment, Interpersonal group therapy.  Evaluation of Outcomes: Not Met   Progress in Treatment: Attending groups: Yes. Participating in groups: Yes. Taking medication as prescribed: Yes. Toleration medication: Yes. Family/Significant other contact made: Yes, individual(s) contacted:  [name] and Haynes Hoehn, legal guardian Patient understands diagnosis: No. Discussing patient identified problems/goals with staff: No. Medical problems stabilized or resolved: No. Denies suicidal/homicidal ideation: Unknown Issues/concerns per patient self-inventory: No. Other: NA  New problem(s) identified: No, Describe:  Pt declined today's meeting, telling Nurse Tech she does not want to meet with the doctor.  New Short Term/Long Term Goal(s): Attend outpatient treatment, take medication as prescribed, develop and implement healthy coping methods  Patient Goals:  Goal unable to be obtained at this time  Discharge Plan or Barriers: Pt will return home and follow up at [facility].  Reason for Continuation of Hospitalization: Medication stabilization  Estimated Length of Stay:5-7 days    Attendees: Patient: 03/04/2019 2:54 PM  Physician: Alethia Berthold 03/04/2019 2:54 PM  Nursing: Duwayne Heck Chisem 03/04/2019 2:54 PM  RN Care Manager: 03/04/2019 2:54 PM  Social Worker: Darren Lehman Prom Assunta Curtis Worden Moton 03/04/2019 2:54 PM  Recreational Therapist: Roanna Epley 03/04/2019 2:54 PM  Other:  03/04/2019  2:54 PM  Other:  03/04/2019 2:54 PM  Other: 03/04/2019 2:54 PM    Scribe for Treatment Team: Yvette Rack, LCSW 03/04/2019 2:54 PM

## 2019-03-04 NOTE — BHH Counselor (Signed)
CSW spoke with the patient's guardians regarding consents. They shared that they do not have a fax machine or computer.  CSW obtained verbal consent from legal guardians to schedule follow up appointments with [place].  CSW team (Darren Victor, [place] Moton) was present to witness verbal consent.  Assunta Curtis, MSW, LCSW 03/04/2019 9:51 AM

## 2019-03-04 NOTE — Plan of Care (Signed)
D- Patient alert and oriented. Patient presents in a pleasant mood on assessment stating that she slept good last night and the only complaint she had was of right calf pain, rating it a "4/10". Patient did request pain medication from this writer. Patient denies any signs/stmptoms of depression/anxiety to this Probation officer. Patient also denies SI, HI, AVH at this time. Patient's goal for today is "try to stay awake".  A- Scheduled medications administered to patient, per MD orders. Support and encouragement provided.  Routine safety checks conducted every 15 minutes.  Patient informed to notify staff with problems or concerns.  R- No adverse drug reactions noted. Patient contracts for safety at this time. Patient compliant with medications and treatment plan. Patient receptive, calm, and cooperative. Patient interacts well with others on the unit.  Patient remains safe at this time.  Problem: Education: Goal: Knowledge of  General Education information/materials will improve Outcome: Progressing Goal: Emotional status will improve Outcome: Progressing Goal: Mental status will improve Outcome: Progressing Goal: Verbalization of understanding the information provided will improve Outcome: Progressing   Problem: Activity: Goal: Interest or engagement in activities will improve Outcome: Progressing Goal: Sleeping patterns will improve Outcome: Progressing   Problem: Coping: Goal: Ability to verbalize frustrations and anger appropriately will improve Outcome: Progressing Goal: Ability to demonstrate self-control will improve Outcome: Progressing   Problem: Health Behavior/Discharge Planning: Goal: Identification of resources available to assist in meeting health care needs will improve Outcome: Progressing Goal: Compliance with treatment plan for underlying cause of condition will improve Outcome: Progressing   Problem: Physical Regulation: Goal: Ability to maintain clinical  measurements within normal limits will improve Outcome: Progressing   Problem: Safety: Goal: Periods of time without injury will increase Outcome: Progressing   Problem: Activity: Goal: Will verbalize the importance of balancing activity with adequate rest periods Outcome: Progressing   Problem: Education: Goal: Will be free of psychotic symptoms Outcome: Progressing Goal: Knowledge of the prescribed therapeutic regimen will improve Outcome: Progressing   Problem: Coping: Goal: Coping ability will improve Outcome: Progressing Goal: Will verbalize feelings Outcome: Progressing   Problem: Health Behavior/Discharge Planning: Goal: Compliance with prescribed medication regimen will improve Outcome: Progressing   Problem: Nutritional: Goal: Ability to achieve adequate nutritional intake will improve Outcome: Progressing   Problem: Role Relationship: Goal: Ability to communicate needs accurately will improve Outcome: Progressing Goal: Ability to interact with others will improve Outcome: Progressing   Problem: Safety: Goal: Ability to redirect hostility and anger into socially appropriate behaviors will improve Outcome: Progressing Goal: Ability to remain free from injury will improve Outcome: Progressing   Problem: Self-Care: Goal: Ability to participate in self-care as condition permits will improve Outcome: Progressing   Problem: Self-Concept: Goal: Will verbalize positive feelings about self Outcome: Progressing

## 2019-03-04 NOTE — Progress Notes (Signed)
Recreation Therapy Notes  INPATIENT RECREATION THERAPY ASSESSMENT  Patient Details Name: Winona Sison MRN: [id number] DOB: August 24, 1961 Today's Date: 03/04/2019       Information Obtained From: Patient  Able to Participate in Assessment/Interview: Yes  Patient Presentation: Responsive  Reason for Admission (Per Patient): Active Symptoms  Patient Stressors: Family  Coping Skills:   Other (Comment)(Word search, puzzles)  Leisure Interests (2+):  Individual - TV  Frequency of Recreation/Participation: Weekly  Awareness of Community Resources:     [employer]:     Current Use:    If no, Barriers?:    Expressed Interest in [place] of Residence:  Insurance underwriter  Patient Main Form of Transportation: Other (Comment)(Family)  Patient Strengths:  N/A  Patient Identified Areas of Improvement:  Finding a groups home  Patient Goal for Hospitalization:  Going to groups  Current SI (including self-harm):  No  Current HI:  No  Current AVH: No  Staff Intervention Plan: Collaborate with Interdisciplinary Treatment Team, Group Attendance  Consent to Intern Participation: N/A  Tamirah George 03/04/2019, 12:55 PM

## 2019-03-04 NOTE — Plan of Care (Signed)
  Problem: Activity: Goal: Interest or engagement in activities will improve Outcome: Progressing Goal: Sleeping patterns will improve Outcome: Progressing  D: Patient has been appropriate on the unit. Denies SI, HI and AVH. Wants social work to tell the doctor not to send her home because she does not want to be with her family. Mood is anxious. Affect is appropriate to circumstance. A: Continue to monitor for safety. R: Safety maintained.

## 2019-03-04 NOTE — [facility] Group Notes (Signed)
LCSW Group Therapy Note   03/04/2019 11:38 AM   Type of Therapy and Topic:  Group Therapy:  Overcoming Obstacles   Participation Level:  Minimal   Description of Group:    In this group patients will be encouraged to explore what they see as obstacles to their own wellness and recovery. They will be guided to discuss their thoughts, feelings, and behaviors related to these obstacles. The group will process together ways to cope with barriers, with attention given to specific choices patients can make. Each patient will be challenged to identify changes they are motivated to make in order to overcome their obstacles. This group will be process-oriented, with patients participating in exploration of their own experiences as well as giving and receiving support and challenge from other group members.   Therapeutic Goals: 1. Patient will identify personal and current obstacles as they relate to admission. 2. Patient will identify barriers that currently interfere with their wellness or overcoming obstacles.  3. Patient will identify feelings, thought process and behaviors related to these barriers. 4. Patient will identify two changes they are willing to make to overcome these obstacles:      Summary of Patient Progress Pt came to group late, but did participate a little bit when she arrived. Pt reported that she likes to go for walks and meditate as coping strategies.     Therapeutic Modalities:   Cognitive Behavioral Therapy Solution Focused Therapy Motivational Interviewing Relapse Prevention Therapy  Evalina Field, MSW, LCSW Clinical Social Work 03/04/2019 11:38 AM

## 2019-03-04 NOTE — Progress Notes (Addendum)
[facility] MD Progress Note  03/04/2019 4:04 PM Jenna Riggs  MRN:  [id number] Subjective:  "My family does want no part of me."  Appetite and sleep are "good".  Denies any adverse effects from medications.  Denies depression, only "sad because they (her family" don't want me."  She did not want to meet with the doctor because "He's trying to send me home and I don't want to go home."  Patient has been living with her brother and his wife for years after her mother passed away.    Patient seen and evaluated, exhibited no hypomanic symptoms today or pressured speech as she was yesterday.  Patient was admitted after becoming aggressive with her family after being off her medicaions for a couple of days.  Patient continues to perseverate on not wanting to return to live with her brother.  Calm and cooperative in the milieu today, appropriate behaviors.  Engaging well with staff and peers.    Principal Problem: Exacerbation of underlying bipolar condition with paranoia and volatility Diagnosis: Active Problems:   Bipolar affective disorder, mixed, severe (HCC)  Total Time spent with patient: 20 minutes  Past Psychiatric History: extensive  Past Medical History:  Past Medical History:  Diagnosis Date  . Mental retardation   . Renal disorder   . Seizures ([place])     Past Surgical History:  Procedure Laterality Date  . ANKLE FUSION Right    Family History:  Family History  Problem Relation Age of Onset  . Diabetes Mother   . Diabetes Father    Family Psychiatric  History: no new data shared Social History:  Social History   Substance and Sexual Activity  Alcohol Use No     Social History   Substance and Sexual Activity  Drug Use No    Social History   Socioeconomic History  . Marital status: Single    Spouse name: Not on file  . Number of children: Not on file  . Years of education: Not on file  . Highest education level: Not on file  Occupational History  . Not on file  Social  Needs  . Financial resource strain: Not very hard  . Food insecurity    Worry: Patient refused    Inability: Patient refused  . Transportation needs    Medical: Patient refused    Non-medical: Patient refused  Tobacco Use  . Smoking status: Current Every Day Smoker    Packs/day: 1.00    Years: 35.00    Pack years: 35.00    Types: Cigarettes  . Smokeless tobacco: Never Used  . Tobacco comment: Patient refused  Substance and Sexual Activity  . Alcohol use: No  . Drug use: No  . Sexual activity: Never    Birth control/protection: None  Lifestyle  . Physical activity    Days per week: Patient refused    Minutes per session: Patient refused  . Stress: To some extent  Relationships  . Social Herbalist on phone: Patient refused    Gets together: Patient refused    Attends religious service: Patient refused    Active member of club or organization: Patient refused    Attends meetings of clubs or organizations: Patient refused    Relationship status: Patient refused  Other Topics Concern  . Not on file  Social History Narrative  . Not on file   Additional Social History:    Sleep: Good  Appetite:  Good  Current Medications: Current Facility-Administered Medications  Medication  Dose Route Frequency Provider Last Rate Last Dose  . acetaminophen (TYLENOL) tablet 650 mg  650 mg Oral Q6H PRN Charm RingsLord, Parul Porcelli Y, NP   650 mg at 03/04/19 [id number]  . alum & mag hydroxide-simeth (MAALOX/MYLANTA) 200-200-20 MG/5ML suspension 30 mL  30 mL Oral Q4H PRN Charm RingsLord, Tinnie Kunin Y, NP      . aspirin EC tablet 81 mg  81 mg Oral Daily Charm RingsLord, Analaya Hoey Y, NP   81 mg at 03/04/19 [id number]  . divalproex (DEPAKOTE) DR tablet 500 mg  500 mg Oral BID Charm RingsLord, Anika Shore Y, NP   500 mg at 03/04/19 [id number]  . lisinopril (ZESTRIL) tablet 10 mg  10 mg Oral Daily Charm RingsLord, Mikolaj Woolstenhulme Y, NP   10 mg at 03/04/19 [id number]  . magnesium hydroxide (MILK OF MAGNESIA) suspension 30 mL  30 mL Oral Daily PRN Charm RingsLord, Jelisha Weed Y, NP      .  perphenazine (TRILAFON) tablet 6 mg  6 mg Oral QHS Malvin JohnsFarah, Brian, MD   6 mg at 03/03/19 2134  . QUEtiapine (SEROQUEL) tablet 100 mg  100 mg Oral TID Malvin JohnsFarah, Brian, MD   100 mg at 03/04/19 1207  . traZODone (DESYREL) tablet 300 mg  300 mg Oral QHS Charm RingsLord, Kavitha Lansdale Y, NP   300 mg at 03/03/19 2134    Lab Results:  No results found for this or any previous visit (from the past 48 hour(s)).  Blood Alcohol level:  Lab Results  Component Value Date   ETH <10 02/27/2019   ETH <10 11/12/2018    Metabolic Disorder Labs: Lab Results  Component Value Date   HGBA1C 5.3 09/19/2016   MPG 105 09/19/2016   Lab Results  Component Value Date   PROLACTIN 12.8 09/19/2016   Lab Results  Component Value Date   CHOL 144 09/19/2016   TRIG 123 09/19/2016   HDL 44 09/19/2016   CHOLHDL 3.3 09/19/2016   VLDL 25 09/19/2016   LDLCALC 75 09/19/2016   LDLCALC 76 02/14/2015    Physical Findings: AIMS: Facial and Oral Movements Muscles of Facial Expression: None, normal Lips and Perioral Area: None, normal Jaw: None, normal Tongue: None, normal,Extremity Movements Upper (arms, wrists, hands, fingers): None, normal Lower (legs, knees, ankles, toes): None, normal, Trunk Movements Neck, shoulders, hips: None, normal, Overall Severity Severity of abnormal movements (highest score from questions above): None, normal Incapacitation due to abnormal movements: None, normal Patient's awareness of abnormal movements (rate only patient's report): No Awareness, Dental Status Current problems with teeth and/or dentures?: No Does patient usually wear dentures?: No  CIWA:    COWS:     Musculoskeletal: Strength & Muscle Tone: within normal limits Gait & Station: normal Patient leans: N/A  Psychiatric Specialty Exam: Physical Exam  Nursing note and vitals reviewed. Constitutional: She is oriented to person, place, and time. She appears well-developed and well-nourished.  HENT:  Head: Normocephalic.  Neck:  Normal range of motion.  Respiratory: Effort normal.  Musculoskeletal: Normal range of motion.  Neurological: She is oriented to person, place, and time.  Psychiatric: Her affect is blunt. Cognition and memory are impaired. She expresses impulsivity.    Review of Systems  All other systems reviewed and are negative.   Blood pressure 107/69, pulse 70, temperature 97.7 F (36.5 C), temperature source Oral, resp. rate 18, height 5\' 3"  (1.6 m), weight 101.2 kg, SpO2 100 %.Body mass index is 39.5 kg/m.  General Appearance: Casual  Eye Contact:  Good  Speech:  Normal  Volume:  Normal  Mood:  Euthymic  Affect:  Blunt  Thought Process: Clear and coherent  Orientation:  Full (Time, Place, and Person)  Thought Content:  Logical  Suicidal Thoughts:  No  Homicidal Thoughts:  No  Memory:  Immediate;   Fair  Judgement:  Fair  Insight:  Shallow  Psychomotor Activity:  Normal  Concentration:  Concentration: Fair  Recall:  Fis[employer]Fair  Fund of Knowledge:  Fair  Language:  Fair  Akathisia:  Negative  Handed:  Right  AIMS (if indicated):     Assets:  Physical Health Resilience Social Support  ADL's:  Intact  Cognition:  WNL  Sleep:  Number of Hours: 7     Treatment Plan Summary: Daily contact with patient to assess and evaluate symptoms and progress in treatment, Medication management and Plan Continue current cognitive and reality based therapies, continue to monitor for safety, escalate perphenazine at night for improved mood stabilization continue quetiapine in the daytime to help with volatility and paranoia, again continue reality-based therapy   Reviewed current treatment plan 03/04/2019  Daily contact with patient to assess and evaluate symptoms and progress in treatment and Medication management 1. Will maintain Q 15 minutes observation for safety.  Estimated LOS:  5-7 days 2. Reviewed admission labs: CMP WDL except glucose 105 H, BUN 29 H, 1.7 H, AST 14 L, total protein 8.3 H, GFR  33/38.  CBC WDL, Valproic acid less than 10 L, negative acetaminophen, pregnancy, COVID, UDS, and salicylate 3. Patient will participate in  group and mileu therapy. Psychotherapy:  Social and Doctor, hospitalcommunication skill training, learning based strategies, and cognitive behavioral  psychotherapies can be considered.  4. Bipolar affective disorder, mixed, severe: Improving: Continue Depakote 500 mg BID, Trilafon 6 mg, Seroquel 100 mg at bedtime 5.  Insomnia:  Continue Trazodone 100 mg at bedtime 6.  Will continue to monitor patient's mood and behavior. 7.  Social Work will obtain collateral information and discuss discharge and             follow up plan.   8.  Discharge concerns will also be addressed:  Safety, stabilization, and       access to medication. 9.  Expected date of discharge March 08, 2019  Nanine MeansJamison Shelie Lansing, NP 03/04/2019, 4:04 PM

## 2019-03-05 DIAGNOSIS — F4325 Adjustment disorder with mixed disturbance of emotions and conduct: Secondary | ICD-10-CM

## 2019-03-05 NOTE — Progress Notes (Signed)
Patient is expressing that she wants to stay here and don't want to go home , scared of her family, patient took her medicines and sleep very well at night time, patient do participate iin scheduled social  activities  With out any issues , denies any SI/HI/AVH , has no complains denies delusions and depressions , teaching and support is provided no distress.

## 2019-03-05 NOTE — Progress Notes (Signed)
Pt has been cooperative today and active in group, day room and going out in the courtyard. Pt expressed to me several times that she does not want to dc. Collier Bullock RN

## 2019-03-05 NOTE — BHH Group Notes (Signed)
Feelings Around Diagnosis 03/05/2019 1PM  Type of Therapy/Topic:  Group Therapy:  Feelings about Diagnosis  Participation Level:  Minimal   Description of Group:   This group will allow patients to explore their thoughts and feelings about diagnoses they have received. Patients will be guided to explore their level of understanding and acceptance of these diagnoses. Facilitator will encourage patients to process their thoughts and feelings about the reactions of others to their diagnosis and will guide patients in identifying ways to discuss their diagnosis with significant others in their lives. This group will be process-oriented, with patients participating in exploration of their own experiences, giving and receiving support, and processing challenge from other group members.   Therapeutic Goals: 1. Patient will demonstrate understanding of diagnosis as evidenced by identifying two or more symptoms of the disorder 2. Patient will be able to express two feelings regarding the diagnosis 3. Patient will demonstrate their ability to communicate their needs through discussion and/or role play  Summary of Patient Progress:  Minimal participation during today's session. Pt required redirection as she became of topic. Minimal input provided.    Therapeutic Modalities:   Cognitive Behavioral Therapy Brief Therapy Feelings Identification    Yvette Rack, LCSW 03/05/2019 2:02 PM

## 2019-03-05 NOTE — Progress Notes (Signed)
[facility] MD Progress Note  03/05/2019 4:28 PM Jenna Riggs  MRN:  [id number] Subjective: Patient seen and chart reviewed.  Follow-up for this woman with developmental disability and history of mood instability.  Brought into the hospital middle of last week because of agitation and conflict at her home with allegations that she had been aggressive towards other family members.  Patient has maintained that other family members started it and were aggressive with her.  On interview today the patient was completely calm and interacting appropriately with others until I sat down to interview her.  She repeated to me that her brother had been violent with her first which is why she got angry.  She told me multiple times that she would not go back to live there.  She asked if she could go live in a group home instead.  Patient does not report auditory or visual hallucinations.  Does not report suicidal or homicidal ideation.  Has not been agitated does not appear to be manic.  She had appropriate emotional reactions.  When I told her that group homes were not currently available she became sad and tearful and ended the interview but was not inappropriate.  She has been taking her medicine since she has been back in the hospital without any complication. Principal Problem: Adjustment disorder with mixed disturbance of emotions and conduct Diagnosis: Principal Problem:   Adjustment disorder with mixed disturbance of emotions and conduct Active Problems:   Moderate intellectual disability   Bipolar affective disorder, mixed, severe ([place])  Total Time spent with patient: 30 minutes  Past Psychiatric History: Patient has a history of a diagnosis of bipolar disorder but also a clear history of intellectual disability with behavior problems and mood instability.  Past Medical History:  Past Medical History:  Diagnosis Date  . Mental retardation   . Renal disorder   . Seizures ([place])     Past Surgical History:   Procedure Laterality Date  . ANKLE FUSION Right    Family History:  Family History  Problem Relation Age of Onset  . Diabetes Mother   . Diabetes Father    Family Psychiatric  History: See previous Social History:  Social History   Substance and Sexual Activity  Alcohol Use No     Social History   Substance and Sexual Activity  Drug Use No    Social History   Socioeconomic History  . Marital status: Single    Spouse name: Not on file  . Number of children: Not on file  . Years of education: Not on file  . Highest education level: Not on file  Occupational History  . Not on file  Social Needs  . Financial resource strain: Not very hard  . Food insecurity    Worry: Patient refused    Inability: Patient refused  . Transportation needs    Medical: Patient refused    Non-medical: Patient refused  Tobacco Use  . Smoking status: Current Every Day Smoker    Packs/day: 1.00    Years: 35.00    Pack years: 35.00    Types: Cigarettes  . Smokeless tobacco: Never Used  . Tobacco comment: Patient refused  Substance and Sexual Activity  . Alcohol use: No  . Drug use: No  . Sexual activity: Never    Birth control/protection: None  Lifestyle  . Physical activity    Days per week: Patient refused    Minutes per session: Patient refused  . Stress: To some extent  Relationships  .  Social Musicianconnections    Talks on phone: Patient refused    Gets together: Patient refused    Attends religious service: Patient refused    Active member of club or organization: Patient refused    Attends meetings of clubs or organizations: Patient refused    Relationship status: Patient refused  Other Topics Concern  . Not on file  Social History Narrative  . Not on file   Additional Social History:                         Sleep: Fair  Appetite:  Fair  Current Medications: Current Facility-Administered Medications  Medication Dose Route Frequency Provider Last Rate Last  Dose  . acetaminophen (TYLENOL) tablet 650 mg  650 mg Oral Q6H PRN Charm RingsLord, Jamison Y, NP   650 mg at 03/04/19 [id number]  . alum & mag hydroxide-simeth (MAALOX/MYLANTA) 200-200-20 MG/5ML suspension 30 mL  30 mL Oral Q4H PRN Charm RingsLord, Jamison Y, NP      . aspirin EC tablet 81 mg  81 mg Oral Daily Charm RingsLord, Jamison Y, NP   81 mg at 03/05/19 [id number]  . divalproex (DEPAKOTE) DR tablet 500 mg  500 mg Oral BID Charm RingsLord, Jamison Y, NP   500 mg at 03/05/19 [id number]  . lisinopril (ZESTRIL) tablet 10 mg  10 mg Oral Daily Charm RingsLord, Jamison Y, NP   10 mg at 03/04/19 [id number]  . magnesium hydroxide (MILK OF MAGNESIA) suspension 30 mL  30 mL Oral Daily PRN Charm RingsLord, Jamison Y, NP      . perphenazine (TRILAFON) tablet 6 mg  6 mg Oral QHS Malvin JohnsFarah, Brian, MD   6 mg at 03/04/19 2153  . QUEtiapine (SEROQUEL) tablet 100 mg  100 mg Oral TID Malvin JohnsFarah, Brian, MD   100 mg at 03/05/19 1209  . traZODone (DESYREL) tablet 300 mg  300 mg Oral QHS Charm RingsLord, Jamison Y, NP   300 mg at 03/04/19 2153    Lab Results: No results found for this or any previous visit (from the past 48 hour(s)).  Blood Alcohol level:  Lab Results  Component Value Date   ETH <10 02/27/2019   ETH <10 11/12/2018    Metabolic Disorder Labs: Lab Results  Component Value Date   HGBA1C 5.3 09/19/2016   MPG 105 09/19/2016   Lab Results  Component Value Date   PROLACTIN 12.8 09/19/2016   Lab Results  Component Value Date   CHOL 144 09/19/2016   TRIG 123 09/19/2016   HDL 44 09/19/2016   CHOLHDL 3.3 09/19/2016   VLDL 25 09/19/2016   LDLCALC 75 09/19/2016   LDLCALC 76 02/14/2015    Physical Findings: AIMS: Facial and Oral Movements Muscles of Facial Expression: None, normal Lips and Perioral Area: None, normal Jaw: None, normal Tongue: None, normal,Extremity Movements Upper (arms, wrists, hands, fingers): None, normal Lower (legs, knees, ankles, toes): None, normal, Trunk Movements Neck, shoulders, hips: None, normal, Overall Severity Severity of abnormal movements (highest score  from questions above): None, normal Incapacitation due to abnormal movements: None, normal Patient's awareness of abnormal movements (rate only patient's report): No Awareness, Dental Status Current problems with teeth and/or dentures?: No Does patient usually wear dentures?: No  CIWA:    COWS:     Musculoskeletal: Strength & Muscle Tone: within normal limits Gait & Station: normal Patient leans: N/A  Psychiatric Specialty Exam: Physical Exam  Nursing note and vitals reviewed. Constitutional: She appears well-developed and well-nourished.  HENT:  Head: Normocephalic and atraumatic.  Eyes: Pupils are equal, round, and reactive to light. Conjunctivae are normal.  Neck: Normal range of motion.  Cardiovascular: Regular rhythm and normal heart sounds.  Respiratory: Effort normal. No respiratory distress.  GI: Soft.  Musculoskeletal: Normal range of motion.  Neurological: She is alert.  Skin: Skin is warm and dry.  Psychiatric: Her mood appears anxious. Her speech is slurred. She is agitated. She is not aggressive. Thought content is not paranoid. Cognition and memory are impaired. She expresses impulsivity. She expresses no homicidal and no suicidal ideation.    Review of Systems  Constitutional: Negative.   HENT: Negative.   Eyes: Negative.   Respiratory: Negative.   Cardiovascular: Negative.   Gastrointestinal: Negative.   Musculoskeletal: Negative.   Skin: Negative.   Neurological: Negative.   Psychiatric/Behavioral: Negative for depression, hallucinations, memory loss, substance abuse and suicidal ideas. The patient is not nervous/anxious and does not have insomnia.     Blood pressure (!) 106/58, pulse 88, temperature 97.9 F (36.6 C), temperature source Oral, resp. rate 18, height 5\' 3"  (1.6 m), weight 101.2 kg, SpO2 98 %.Body mass index is 39.5 kg/m.  General Appearance: Casual  Eye Contact:  Fair  Speech:  Clear and Coherent  Volume:  Decreased  Mood:  Euthymic   Affect:  Constricted  Thought Process:  Goal Directed  Orientation:  Full (Time, Place, and Person)  Thought Content:  Logical  Suicidal Thoughts:  No  Homicidal Thoughts:  No  Memory:  Immediate;   Fair Recent;   Fair Remote;   Fair  Judgement:  Impaired  Insight:  Shallow  Psychomotor Activity:  Normal  Concentration:  Concentration: Fair  Recall:  [employer]  Fund of Knowledge:  Fair  Language:  Fair  Akathisia:  No  Handed:  Right  AIMS (if indicated):     Assets:  Desire for Improvement Housing Physical Health  ADL's:  Intact  Cognition:  Impaired,  Mild and Moderate  Sleep:  Number of Hours: 6.15     Treatment Plan Summary: Daily contact with patient to assess and evaluate symptoms and progress in treatment, Medication management and Plan Patient does appear to be stable back on her medicine with the primary combination of Depakote and Seroquel.  She is adamant that she does not want to go back home.  It does not strike me as being necessarily a psychotic situation.  I listened to the patient and offered her empathy and support but also informed her that group homes were not currently available because of the coronavirus.  Patient became tearful and upset but was not hostile towards me.  Patient really does not have any reason necessarily to continue in a psychiatric ward other than the placement difficulty.  Treatment team will discuss tomorrow and we will see if we can start working on arrangements either to find an alternative place or to gradually ease the patient back into going home.  No change to medicine today.  Mordecai RasmussenJohn Keiona Jenison, MD 03/05/2019, 4:28 PM

## 2019-03-05 NOTE — Progress Notes (Signed)
Recreation Therapy Notes  Date: 03/05/2019  Time: 9:30 am  Location: Craft room  Behavioral response: Appropriate   Intervention Topic: Goals  Discussion/Intervention:  Group content on today was focused on goals. Patients described what goals are and how they define goals. Individuals expressed how they go about setting goals and reaching them. The group identified how important goals are and if they make short term goals to reach long term goals. Patients described how many goals they work on at a time and what affects them not reaching their goal. Individuals described how much time they put into planning and obtaining their goals. The group participated in the intervention "My Goal Board" and made personal goal boards to help them achieve their goal. Clinical Observations/Feedback:  Patient came to group and defined goals as what keeps her going. She explained that her goal is to not go home and figure out if she is pregnant or not. Participant express that she would also like improve the way she copes with things. Individual was social with peers and staff while participating in group.  Nirali Magouirk LRT/CTRS           Lamisha Roussell 03/05/2019 11:31 AM

## 2019-03-05 NOTE — Plan of Care (Signed)
  Problem: Education: Goal: Knowledge of [place] [facility] Education information/materials will improve Outcome: Progressing Goal: Emotional status will improve Outcome: Progressing Goal: Mental status will improve Outcome: Progressing Goal: Verbalization of understanding the information provided will improve Outcome: Progressing   Problem: Activity: Goal: Interest or engagement in activities will improve Outcome: Progressing Goal: Sleeping patterns will improve Outcome: Progressing   Problem: Coping: Goal: Ability to verbalize frustrations and anger appropriately will improve Outcome: Progressing Goal: Ability to demonstrate self-control will improve Outcome: Progressing   Problem: Health Behavior/Discharge Planning: Goal: Identification of resources available to assist in meeting health care needs will improve Outcome: Progressing Goal: Compliance with treatment plan for underlying cause of condition will improve Outcome: Progressing   Problem: Physical Regulation: Goal: Ability to maintain clinical measurements within normal limits will improve Outcome: Progressing   Problem: Safety: Goal: Periods of time without injury will increase Outcome: Progressing   Problem: Activity: Goal: Will verbalize the importance of balancing activity with adequate rest periods Outcome: Progressing   Problem: Education: Goal: Will be free of psychotic symptoms Outcome: Progressing Goal: Knowledge of the prescribed therapeutic regimen will improve Outcome: Progressing   Problem: Coping: Goal: Coping ability will improve Outcome: Progressing Goal: Will verbalize feelings Outcome: Progressing   Problem: Health Behavior/Discharge Planning: Goal: Compliance with prescribed medication regimen will improve Outcome: Progressing   Problem: Nutritional: Goal: Ability to achieve adequate nutritional intake will improve Outcome: Progressing   Problem: Role Relationship: Goal:  Ability to communicate needs accurately will improve Outcome: Progressing Goal: Ability to interact with others will improve Outcome: Progressing   Problem: Safety: Goal: Ability to redirect hostility and anger into socially appropriate behaviors will improve Outcome: Progressing Goal: Ability to remain free from injury will improve Outcome: Progressing   Problem: Self-Care: Goal: Ability to participate in self-care as condition permits will improve Outcome: Progressing   Problem: Self-Concept: Goal: Will verbalize positive feelings about self Outcome: Progressing   

## 2019-03-05 NOTE — Plan of Care (Signed)
Pt rates depression 1/10 and anxiety 0/10. Pt denies SI, HI and AVH. Pt was educated on care plan and verbalizes understanding. Collier Bullock RN Problem: Education: Goal: Knowledge of [place] [facility] Education information/materials will improve Outcome: Progressing Goal: Emotional status will improve Outcome: Not Progressing Goal: Mental status will improve Outcome: Not Progressing Goal: Verbalization of understanding the information provided will improve Outcome: Progressing   Problem: Activity: Goal: Interest or engagement in activities will improve Outcome: Progressing Goal: Sleeping patterns will improve Outcome: Progressing   Problem: Coping: Goal: Ability to verbalize frustrations and anger appropriately will improve Outcome: Not Progressing Goal: Ability to demonstrate self-control will improve Outcome: Not Progressing   Problem: Health Behavior/Discharge Planning: Goal: Identification of resources available to assist in meeting health care needs will improve Outcome: Progressing Goal: Compliance with treatment plan for underlying cause of condition will improve Outcome: Progressing   Problem: Physical Regulation: Goal: Ability to maintain clinical measurements within normal limits will improve Outcome: Progressing   Problem: Safety: Goal: Periods of time without injury will increase Outcome: Progressing   Problem: Activity: Goal: Will verbalize the importance of balancing activity with adequate rest periods Outcome: Progressing   Problem: Education: Goal: Will be free of psychotic symptoms Outcome: Progressing Goal: Knowledge of the prescribed therapeutic regimen will improve Outcome: Progressing   Problem: Coping: Goal: Coping ability will improve Outcome: Progressing Goal: Will verbalize feelings Outcome: Progressing   Problem: Health Behavior/Discharge Planning: Goal: Compliance with prescribed medication regimen will improve Outcome: Progressing   Problem: Nutritional: Goal: Ability to achieve adequate nutritional intake will improve Outcome: Progressing   Problem: Role Relationship: Goal: Ability to communicate needs accurately will improve Outcome: Progressing Goal: Ability to interact with others will improve Outcome: Progressing   Problem: Safety: Goal: Ability to redirect hostility and anger into socially appropriate behaviors will improve Outcome: Progressing Goal: Ability to remain free from injury will improve Outcome: Progressing   Problem: Self-Care: Goal: Ability to participate in self-care as condition permits will improve Outcome: Progressing   Problem: Self-Concept: Goal: Will verbalize positive feelings about self Outcome: Not Progressing   Problem: Self-Concept: Goal: Will verbalize positive feelings about self Outcome: Not Progressing

## 2019-03-06 NOTE — [facility] Suicide Risk Assessment (Signed)
[facility] Discharge Suicide Risk Assessment   Principal Problem: Adjustment disorder with mixed disturbance of emotions and conduct Discharge Diagnoses: Principal Problem:   Adjustment disorder with mixed disturbance of emotions and conduct Active Problems:   Moderate intellectual disability   Bipolar affective disorder, mixed, severe ([facility])   Total Time spent with patient: 30 minutes  Musculoskeletal: Strength & Muscle Tone: within normal limits Gait & Station: normal Patient leans: N/A  Psychiatric Specialty Exam: Review of Systems  Constitutional: Negative.   HENT: Negative.   Eyes: Negative.   Respiratory: Negative.   Cardiovascular: Negative.   Gastrointestinal: Negative.   Musculoskeletal: Negative.   Skin: Negative.   Neurological: Negative.   Psychiatric/Behavioral: Negative for depression, hallucinations, memory loss, substance abuse and suicidal ideas. The patient is not nervous/anxious and does not have insomnia.     Blood pressure 125/72, pulse 80, temperature (!) 97.4 F (36.3 C), temperature source Oral, resp. rate 18, height 5\' 3"  (1.6 m), weight 101.2 kg, SpO2 100 %.Body mass index is 39.5 kg/m.  General Appearance: Casual  Eye Contact::  Good  Speech:  Slow409  Volume:  Decreased  Mood:  Euthymic  Affect:  Constricted  Thought Process:  Coherent  Orientation:  Full (Time, Place, and Person)  Thought Content:  Logical  Suicidal Thoughts:  No  Homicidal Thoughts:  No  Memory:  Immediate;   Fair Recent;   Fair Remote;   Fair  Judgement:  Fair  Insight:  Fair  Psychomotor Activity:  Decreased  Concentration:  Fair  Recall:  [employer] [place]  Language: Fair  Akathisia:  No  Handed:  Right  AIMS (if indicated):     Assets:  Desire for Improvement Housing Resilience  Sleep:  Number of Hours: 6.5  Cognition: Impaired,  Mild  ADL's:  Impaired   Mental Status Per Nursing Assessment::   On Admission:  Suicidal ideation indicated by others,  Thoughts of violence towards others  Demographic Factors:  Unemployed  Loss Factors: NA  Historical Factors: Impulsivity  Risk Reduction Factors:   Living with another person, especially a relative, Positive social support and Positive therapeutic relationship  Continued Clinical Symptoms:  Dysthymia  Cognitive Features That Contribute To Risk:  Thought constriction (tunnel vision)    Suicide Risk:  Minimal: No identifiable suicidal ideation.  Patients presenting with no risk factors but with morbid ruminations; may be classified as minimal risk based on the severity of the depressive symptoms  Follow-up Information    [facility] up on 03/13/2019.   Why: Please follow up with [facility] on Wednesday, July 15th at 9:20am. Please take your hospital discharge paperwork with you to your appointment. Thank you. Contact information: [place] [id number] [phone]           Plan Of Care/Follow-up recommendations:  Activity:  Activity as tolerated Diet:  Regular diet Other:  Follow-up with outpatient provider as routine.  Alethia Berthold, MD 03/06/2019, 3:41 PM

## 2019-03-06 NOTE — Progress Notes (Signed)
D - Patient was outside upon arrival to the unit with the group. Patient was pleasant during assessment and medication administration. Patient denies SI/HI/AVH and pain with this Probation officer. Patient was unable to say if she had depression or anxiety. Patient said she had a good day until another patient started cussing her out in the dayroom for no reason.   A - Patient compliant with medication administration per MD orders and procedures on the unit. Patient given education. Patient given support and encouragement to be active in her treatment plan. Patient informed to let staff know if there are any issues or problems on the unit.   R - Patient being monitored Q 15 minutes for safety per unit protocol. Patient remains safe on the unit.

## 2019-03-06 NOTE — Plan of Care (Signed)
Patient compliant with medication administration per MD orders and procedures on the unit.   Problem: Education: Goal: Emotional status will improve Outcome: Not Progressing Goal: Mental status will improve Outcome: Not Progressing

## 2019-03-06 NOTE — Progress Notes (Signed)
[facility]  03/06/2019 3:37 PM Jenna AblesLinda Riggs  MRN:  [id number] Subjective: Follow-up for this patient with mild intellectual disability and behavior problems.  Patient seen chart reviewed.  Patient has no specific new complaint today.  Mood is still grumpy when she discussed going back to her group home but otherwise her behavior has been calm.  She denies any suicidal ideation or homicidal ideation.  Denies any hallucinations.  She is taking care of her basic ADLs and is able to interact with staff and other patients without losing her temper. Principal Problem: Adjustment disorder with mixed disturbance of emotions and conduct Diagnosis: Principal Problem:   Adjustment disorder with mixed disturbance of emotions and conduct Active Problems:   Moderate intellectual disability   Bipolar affective disorder, mixed, severe (HCC)  Total Time spent with patient: 20 minutes  Past Psychiatric History: Past history of chronic behavior problems related to intellectual disability  Past Medical History:  Past Medical History:  Diagnosis Date  . Mental retardation   . Renal disorder   . Seizures (HCC)     Past Surgical History:  Procedure Laterality Date  . ANKLE FUSION Right    Family History:  Family History  Problem Relation Age of Onset  . Diabetes Mother   . Diabetes Father    Family Psychiatric  History: See previous Social History:  Social History   Substance and Sexual Activity  Alcohol Use No     Social History   Substance and Sexual Activity  Drug Use No    Social History   Socioeconomic History  . Marital status: Single    Spouse name: Not on file  . Number of children: Not on file  . Years of education: Not on file  . Highest education level: Not on file  Occupational History  . Not on file  Social Needs  . Financial resource strain: Not very hard  . Food insecurity    Worry: Patient refused    Inability: Patient refused  . Transportation needs     Medical: Patient refused    Non-medical: Patient refused  Tobacco Use  . Smoking status: Current Every Day Smoker    Packs/day: 1.00    Years: 35.00    Pack years: 35.00    Types: Cigarettes  . Smokeless tobacco: Never Used  . Tobacco comment: Patient refused  Substance and Sexual Activity  . Alcohol use: No  . Drug use: No  . Sexual activity: Never    Birth control/protection: None  Lifestyle  . Physical activity    Days per week: Patient refused    Minutes per session: Patient refused  . Stress: To some extent  Relationships  . Social Musicianconnections    Talks on phone: Patient refused    Gets together: Patient refused    Attends religious service: Patient refused    Active member of club or organization: Patient refused    Attends meetings of clubs or organizations: Patient refused    Relationship status: Patient refused  Other Topics Concern  . Not on file  Social History Narrative  . Not on file   Additional Social History:                         Sleep: Fair  Appetite:  Fair  Current Medications: Current Facility-Administered Medications  Medication Dose Route Frequency Provider Last Rate Last Dose  . acetaminophen (TYLENOL) tablet 650 mg  650 mg Oral Q6H PRN Nanine MeansLord, Jamison  Y, NP   650 mg at 03/04/19 0808  . alum & mag hydroxide-simeth (MAALOX/MYLANTA) 200-200-20 MG/5ML suspension 30 mL  30 mL Oral Q4H PRN [name], NP      . aspirin EC tablet 81 mg  81 mg Oral Daily [name], NP   81 mg at 03/06/19 0905  . divalproex (DEPAKOTE) DR tablet 500 mg  500 mg Oral BID [name], NP   500 mg at 03/06/19 0905  . lisinopril (ZESTRIL) tablet 10 mg  10 mg Oral Daily [name], NP   10 mg at 03/06/19 0905  . magnesium hydroxide (MILK OF MAGNESIA) suspension 30 mL  30 mL Oral Daily PRN [name], NP      . perphenazine (TRILAFON) tablet 6 mg  6 mg Oral QHS [name], MD   6 mg at 03/05/19 2214  . QUEtiapine (SEROQUEL) tablet 100 mg   100 mg Oral TID [name], MD   100 mg at 03/06/19 1252  . traZODone (DESYREL) tablet 300 mg  300 mg Oral QHS [name], NP   300 mg at 03/05/19 2214    Lab Results: No results found for this or any previous visit (from the past 48 hour(s)).  Blood Alcohol level:  Lab Results  Component Value Date   ETH <10 02/27/2019   ETH <10 18/56/3149    Metabolic Disorder Labs: Lab Results  Component Value Date   HGBA1C 5.3 09/19/2016   MPG 105 09/19/2016   Lab Results  Component Value Date   PROLACTIN 12.8 09/19/2016   Lab Results  Component Value Date   CHOL 144 09/19/2016   TRIG 123 09/19/2016   HDL 44 09/19/2016   CHOLHDL 3.3 09/19/2016   VLDL 25 09/19/2016   LDLCALC 75 09/19/2016   LDLCALC 76 02/14/2015    Physical Findings: AIMS: Facial and Oral Movements Muscles of Facial Expression: None, normal Lips and Perioral Area: None, normal Jaw: None, normal Tongue: None, normal,Extremity Movements Upper (arms, wrists, hands, fingers): None, normal Lower (legs, knees, ankles, toes): None, normal, Trunk Movements Neck, shoulders, hips: None, normal, Overall Severity Severity of abnormal movements (highest score from questions above): None, normal Incapacitation due to abnormal movements: None, normal Patient's awareness of abnormal movements (rate only patient's report): No Awareness, Dental Status Current problems with teeth and/or dentures?: No Does patient usually wear dentures?: No  CIWA:    COWS:     Musculoskeletal: Strength & Muscle Tone: within normal limits Gait & Station: normal Patient leans: N/A  Psychiatric Specialty Exam: Physical Exam  Nursing note and vitals reviewed. Constitutional: She appears well-developed and well-nourished.  HENT:  Head: Normocephalic and atraumatic.  Eyes: Pupils are equal, round, and reactive to light. Conjunctivae are normal.  Neck: Normal range of motion.  Cardiovascular: Regular rhythm and normal heart sounds.   Respiratory: Effort normal. No respiratory distress.  GI: Soft.  Musculoskeletal: Normal range of motion.  Neurological: She is alert.  Skin: Skin is warm and dry.  Psychiatric: Her affect is blunt. Her speech is delayed. She is slowed. Cognition and memory are impaired. She expresses impulsivity. She expresses no homicidal and no suicidal ideation.    Review of Systems  Constitutional: Negative.   HENT: Negative.   Eyes: Negative.   Respiratory: Negative.   Cardiovascular: Negative.   Gastrointestinal: Negative.   Musculoskeletal: Negative.   Skin: Negative.   Neurological: Negative.   Psychiatric/Behavioral: Negative.     Blood pressure 125/72, pulse 80,  temperature (!) 97.4 F (36.3 C), temperature source Oral, resp. rate 18, height 5\' 3"  (1.6 m), weight 101.2 kg, SpO2 100 %.Body mass index is 39.5 kg/m.  General Appearance: Casual  Eye Contact:  Fair  Speech:  Normal Rate  Volume:  Normal  Mood:  Anxious  Affect:  Congruent  Thought Process:  Coherent  Orientation:  Full (Time, Place, and Person)  Thought Content:  Tangential  Suicidal Thoughts:  No  Homicidal Thoughts:  No  Memory:  Immediate;   Fair Recent;   Fair Remote;   Fair  Judgement:  Impaired  Insight:  Shallow  Psychomotor Activity:  Normal  Concentration:  Concentration: Fair  Recall:  [employer]Fair  Fund of Knowledge:  Poor  Language:  Fair  Akathisia:  No  Handed:  Right  AIMS (if indicated):     Assets:  Desire for Improvement Housing Social Support  ADL's:  Impaired  Cognition:  Impaired,  Mild  Sleep:  Number of Hours: 6.5     Treatment Plan Summary: Daily contact with patient to assess and evaluate symptoms and progress in treatment, Medication management and Plan Patient was informed again that she is going to be discharged tomorrow.  She did not have a very bad reaction to it.  Took it pretty well.  Spoke with treatment team today.  Patient can be discharged tomorrow with follow-up as  usual.  Mordecai RasmussenJohn , MD 03/06/2019, 3:37 PM

## 2019-03-06 NOTE — Progress Notes (Signed)
D - Patient was outside upon arrival to the unit with the group. Patient was pleasant during assessment and medication administration. Patient denies SI/HI/AVH and pain with this Probation officer. Patient was unable to say if she had depression or anxiety.   A - Patient compliant with medication administration per MD orders and procedures on the unit. Patient given education. Patient given support and encouragement to be active in her treatment plan. Patient informed to let staff know if there are any issues or problems on the unit.   R - Patient being monitored Q 15 minutes for safety per unit protocol. Patient remains safe on the unit.

## 2019-03-06 NOTE — Progress Notes (Signed)
Recreation Therapy Notes   Date: 03/06/2019  Time: 9:30 am  Location: Craft room  Behavioral response: Appropriate   Intervention Topic: Coping Skills  Discussion/Intervention:  Group content on today was focused on goals. Patients described what goals are and how they define goals. Individuals expressed how they go about setting goals and reaching them. The group identified how important goals are and if they make short term goals to reach long term goals. Patients described how many goals they work on at a time and what affects them not reaching their goal. Individuals described how much time they put into planning and obtaining their goals. The group participated in the intervention "My Goal Board" and made personal goal boards to help them achieve their goal. Clinical Observations/Feedback:  Patient came to group and define defined coping skills as dealing with family. Individual was social with peers and staff while participating in group.  Jenna Riggs LRT/CTRS         Ludivina Guymon 03/06/2019 12:35 PM

## 2019-03-06 NOTE — Plan of Care (Signed)
Patient disorganized and confused on the unit this evening. Patient redirectable.   Problem: Education: Goal: Emotional status will improve Outcome: Not Progressing Goal: Mental status will improve Outcome: Not Progressing

## 2019-03-06 NOTE — BHH Group Notes (Signed)
LCSW Group Therapy Note  03/06/2019 1:00PM   Type of Therapy/Topic:  Group Therapy:  Emotion Regulation  Participation Level:  None   Description of Group:   The purpose of this group is to assist patients in learning to regulate negative emotions and experience positive emotions. Patients will be guided to discuss ways in which they have been vulnerable to their negative emotions. These vulnerabilities will be juxtaposed with experiences of positive emotions or situations, and patients will be challenged to use positive emotions to combat negative ones. Special emphasis will be placed on coping with negative emotions in conflict situations, and patients will process healthy conflict resolution skills.  Therapeutic Goals: 1. Patient will identify two positive emotions or experiences to reflect on in order to balance out negative emotions 2. Patient will label two or more emotions that they find the most difficult to experience 3. Patient will demonstrate positive conflict resolution skills through discussion and/or role plays  Summary of Patient Progress: Patient arrived to group as group was ending.   Therapeutic Modalities:   Cognitive Behavioral Therapy Feelings Identification Dialectical Behavioral Therapy  Assunta Curtis, MSW, LCSW 03/06/2019 10:41 AM

## 2019-03-06 NOTE — Plan of Care (Signed)
D- Patient alert and oriented. Patient presents in a pleasant mood on assessment reporting that she didn't sleep well last night because "it was too dark and I had to turn on a light". Patient denies SI, HI, AVH, and pain at this time. Patient also denies any signs/symptoms of depression/anxiety to this Probation officer. Patient's stated goal for today is to "try and stay awake, but I can't".  A- Scheduled medications administered to patient, per MD orders. Support and encouragement provided.  Routine safety checks conducted every 15 minutes.  Patient informed to notify staff with problems or concerns.  R- No adverse drug reactions noted. Patient contracts for safety at this time. Patient compliant with medications and treatment plan. Patient receptive, calm, and cooperative. Patient interacts well with others on the unit.  Patient remains safe at this time.  Problem: Education: Goal: Knowledge of [facility] Education information/materials will improve Outcome: Progressing Goal: Emotional status will improve Outcome: Progressing Goal: Mental status will improve Outcome: Progressing Goal: Verbalization of understanding the information provided will improve Outcome: Progressing   Problem: Activity: Goal: Interest or engagement in activities will improve Outcome: Progressing Goal: Sleeping patterns will improve Outcome: Progressing   Problem: Coping: Goal: Ability to verbalize frustrations and anger appropriately will improve Outcome: Progressing Goal: Ability to demonstrate self-control will improve Outcome: Progressing   Problem: Health Behavior/Discharge Planning: Goal: Identification of resources available to assist in meeting health care needs will improve Outcome: Progressing Goal: Compliance with treatment plan for underlying cause of condition will improve Outcome: Progressing   Problem: Physical Regulation: Goal: Ability to maintain clinical measurements within normal limits  will improve Outcome: Progressing   Problem: Safety: Goal: Periods of time without injury will increase Outcome: Progressing   Problem: Activity: Goal: Will verbalize the importance of balancing activity with adequate rest periods Outcome: Progressing   Problem: Education: Goal: Will be free of psychotic symptoms Outcome: Progressing Goal: Knowledge of the prescribed therapeutic regimen will improve Outcome: Progressing   Problem: Coping: Goal: Coping ability will improve Outcome: Progressing Goal: Will verbalize feelings Outcome: Progressing   Problem: Health Behavior/Discharge Planning: Goal: Compliance with prescribed medication regimen will improve Outcome: Progressing   Problem: Nutritional: Goal: Ability to achieve adequate nutritional intake will improve Outcome: Progressing   Problem: Role Relationship: Goal: Ability to communicate needs accurately will improve Outcome: Progressing Goal: Ability to interact with others will improve Outcome: Progressing   Problem: Safety: Goal: Ability to redirect hostility and anger into socially appropriate behaviors will improve Outcome: Progressing Goal: Ability to remain free from injury will improve Outcome: Progressing   Problem: Self-Care: Goal: Ability to participate in self-care as condition permits will improve Outcome: Progressing   Problem: Self-Concept: Goal: Will verbalize positive feelings about self Outcome: Progressing

## 2019-03-07 MED ORDER — TRAZODONE HCL 100 MG PO TABS: 300.0000 mg | ORAL_TABLET | Freq: Every day | ORAL | 1 refills | Status: DC

## 2019-03-07 MED ORDER — ASPIRIN EC 81 MG PO TBEC: 81.0000 mg | DELAYED_RELEASE_TABLET | Freq: Every day | ORAL | 1 refills | Status: DC

## 2019-03-07 MED ORDER — DIVALPROEX SODIUM 500 MG PO DR TAB: 500.0000 mg | DELAYED_RELEASE_TABLET | Freq: Two times a day (BID) | ORAL | 1 refills | Status: DC

## 2019-03-07 MED ORDER — PERPHENAZINE 2 MG PO TABS: 6.0000 mg | ORAL_TABLET | Freq: Every day | ORAL | 1 refills | Status: DC

## 2019-03-07 MED ORDER — QUETIAPINE FUMARATE 200 MG PO TABS
400.0000 mg | ORAL_TABLET | Freq: Every day | ORAL | Status: DC
  Administered 2019-03-07 – 2019-03-10 (×4): 400 mg via ORAL
  Filled 2019-03-07 (×4): qty 2

## 2019-03-07 MED ORDER — QUETIAPINE FUMARATE 100 MG PO TABS: 100.0000 mg | ORAL_TABLET | Freq: Three times a day (TID) | ORAL | 1 refills | Status: DC

## 2019-03-07 MED ORDER — LISINOPRIL 10 MG PO TABS: 10.0000 mg | ORAL_TABLET | Freq: Every day | ORAL | 1 refills | Status: DC
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
## 2019-03-07 NOTE — Progress Notes (Signed)
  [facility] Adult Case Management Discharge Plan :  Will you be returning to the same living situation after discharge:  Yes,  pt is returning home.  At discharge, do you have transportation home?: Yes,  pt's brother will pick up Do you have the ability to pay for your medications: Yes,  [employer] Medicare  Release of information consent forms completed and in the chart;  Patient's signature needed at discharge.  Patient to Follow up at: Follow-up Information    [facility] on 03/13/2019.   Why: Please follow up with [facility] on Wednesday, July 15th at 9:20am. Please take your hospital discharge paperwork with you to your appointment. Thank you. Contact information: [place] [address] [phone]           Next level of care provider has access to [facility] and Suicide Prevention discussed: Yes,  SPE completed with the patient's brother.   Have you used any form of tobacco in the last 30 days? (Cigarettes, Smokeless Tobacco, Cigars, and/or Pipes): Yes  Has patient been referred to the Quitline?: Patient refused referral  Patient has been referred for addiction treatment: [place], LCSW 03/07/2019, 9:46 AM

## 2019-03-07 NOTE — Progress Notes (Signed)
[facility] MD Progress Note  03/07/2019 12:59 PM Jenna AblesLinda Riggs  MRN:  [id number] Subjective: Patient seen and chart reviewed.  Additionally I spoke with the patient's guardian, her sister-in-law.  The patient herself continues to be mostly well behaved on the unit although it does not take much to get her irritated.  When the discussion turns to discharge however she starts to lose her temper again and made comments about how she was going to "poison" her brother and sister-in-law when she got home.  They got word of this and had concerns about it.  I spoke with them for quite a while.  They admit that she has behavior problems all the time and always has but also seemed to indicate that there are times when her irritability and temper are better than this.  They are concerned that she is frequently noncompliant with medication at home. Principal Problem: Adjustment disorder with mixed disturbance of emotions and conduct Diagnosis: Principal Problem:   Adjustment disorder with mixed disturbance of emotions and conduct Active Problems:   Moderate intellectual disability   Bipolar affective disorder, mixed, severe (HCC)  Total Time spent with patient: 30 minutes  Past Psychiatric History: Patient has developmental disability with mild to moderate intellectual disability.  Chronic behavior problems.  Diagnosis of bipolar.  Past Medical History:  Past Medical History:  Diagnosis Date  . Mental retardation   . Renal disorder   . Seizures (HCC)     Past Surgical History:  Procedure Laterality Date  . ANKLE FUSION Right    Family History:  Family History  Problem Relation Age of Onset  . Diabetes Mother   . Diabetes Father    Family Psychiatric  History: Not reported see previous Social History:  Social History   Substance and Sexual Activity  Alcohol Use No     Social History   Substance and Sexual Activity  Drug Use No    Social History   Socioeconomic History  . Marital status:  Single    Spouse name: Not on file  . Number of children: Not on file  . Years of education: Not on file  . Highest education level: Not on file  Occupational History  . Not on file  Social Needs  . Financial resource strain: Not very hard  . Food insecurity    Worry: Patient refused    Inability: Patient refused  . Transportation needs    Medical: Patient refused    Non-medical: Patient refused  Tobacco Use  . Smoking status: Current Every Day Smoker    Packs/day: 1.00    Years: 35.00    Pack years: 35.00    Types: Cigarettes  . Smokeless tobacco: Never Used  . Tobacco comment: Patient refused  Substance and Sexual Activity  . Alcohol use: No  . Drug use: No  . Sexual activity: Never    Birth control/protection: None  Lifestyle  . Physical activity    Days per week: Patient refused    Minutes per session: Patient refused  . Stress: To some extent  Relationships  . Social Musicianconnections    Talks on phone: Patient refused    Gets together: Patient refused    Attends religious service: Patient refused    Active member of club or organization: Patient refused    Attends meetings of clubs or organizations: Patient refused    Relationship status: Patient refused  Other Topics Concern  . Not on file  Social History Narrative  . Not on file  Additional Social History:  Specify valuables returned: hair bow,chips and bottle water                      Sleep: Fair  Appetite:  Negative  Current Medications: Current Facility-Administered Medications  Medication Dose Route Frequency Provider Last Rate Last Dose  . acetaminophen (TYLENOL) tablet 650 mg  650 mg Oral Q6H PRN [name], NP   650 mg at 03/04/19 1287  . alum & mag hydroxide-simeth (MAALOX/MYLANTA) 200-200-20 MG/5ML suspension 30 mL  30 mL Oral Q4H PRN [name], NP      . aspirin EC tablet 81 mg  81 mg Oral Daily [name], NP   81 mg at 03/07/19 8676  . divalproex (DEPAKOTE) DR tablet  500 mg  500 mg Oral BID [name], NP   500 mg at 03/07/19 7209  . lisinopril (ZESTRIL) tablet 10 mg  10 mg Oral Daily [name], NP   10 mg at 03/07/19 0824  . magnesium hydroxide (MILK OF MAGNESIA) suspension 30 mL  30 mL Oral Daily PRN [name], NP      . QUEtiapine (SEROQUEL) tablet 400 mg  400 mg Oral QHS Clapacs, John T, MD      . traZODone (DESYREL) tablet 300 mg  300 mg Oral QHS [name], NP   300 mg at 03/06/19 2302    Lab Results: No results found for this or any previous visit (from the past 48 hour(s)).  Blood Alcohol level:  Lab Results  Component Value Date   ETH <10 02/27/2019   ETH <10 47/04/6282    Metabolic Disorder Labs: Lab Results  Component Value Date   HGBA1C 5.3 09/19/2016   MPG 105 09/19/2016   Lab Results  Component Value Date   PROLACTIN 12.8 09/19/2016   Lab Results  Component Value Date   CHOL 144 09/19/2016   TRIG 123 09/19/2016   HDL 44 09/19/2016   CHOLHDL 3.3 09/19/2016   VLDL 25 09/19/2016   LDLCALC 75 09/19/2016   LDLCALC 76 02/14/2015    Physical Findings: AIMS: Facial and Oral Movements Muscles of Facial Expression: None, normal Lips and Perioral Area: None, normal Jaw: None, normal Tongue: None, normal,Extremity Movements Upper (arms, wrists, hands, fingers): None, normal Lower (legs, knees, ankles, toes): None, normal, Trunk Movements Neck, shoulders, hips: None, normal, Overall Severity Severity of abnormal movements (highest score from questions above): None, normal Incapacitation due to abnormal movements: None, normal Patient's awareness of abnormal movements (rate only patient's report): No Awareness, Dental Status Current problems with teeth and/or dentures?: No Does patient usually wear dentures?: No  CIWA:    COWS:     Musculoskeletal: Strength & Muscle Tone: within normal limits Gait & Station: normal Patient leans: N/A  Psychiatric Specialty Exam: Physical Exam  Nursing note and  vitals reviewed. Constitutional: She appears well-developed and well-nourished.  HENT:  Head: Normocephalic and atraumatic.  Eyes: Pupils are equal, round, and reactive to light. Conjunctivae are normal.  Neck: Normal range of motion.  Cardiovascular: Normal heart sounds.  Respiratory: Effort normal.  GI: Soft.  Musculoskeletal: Normal range of motion.  Neurological: She is alert.  Skin: Skin is warm and dry.  Psychiatric: Her affect is labile. Her speech is tangential and slurred. She is agitated. She is not aggressive. Cognition and memory are impaired. She expresses impulsivity and inappropriate judgment. She expresses homicidal ideation. She exhibits abnormal recent memory and abnormal remote memory.  Patient makes "homicidal" threatening comments directed towards her brother and sister-in-law.  I think that these have to be understood in the context of her limited intellect.    Review of Systems  Constitutional: Negative.   HENT: Negative.   Eyes: Negative.   Respiratory: Negative.   Cardiovascular: Negative.   Gastrointestinal: Negative.   Musculoskeletal: Negative.   Skin: Negative.   Neurological: Negative.   Psychiatric/Behavioral: The patient is nervous/anxious.     Blood pressure 107/83, pulse 84, temperature 98.7 F (37.1 C), temperature source Oral, resp. rate 18, height 5\' 3"  (1.6 m), weight 101.2 kg, SpO2 95 %.Body mass index is 39.5 kg/m.  General Appearance: Casual  Eye Contact:  Fair  Speech:  Slurred  Volume:  Normal  Mood:  Irritable  Affect:  Congruent  Thought Process:  Coherent  Orientation:  Full (Time, Place, and Person)  Thought Content:  Illogical, Paranoid Ideation and Tangential  Suicidal Thoughts:  No  Homicidal Thoughts:  Patient makes homicidal threats.  Based on her limited intellect and past history I do not think that she is likely to do anything actually "homicidal" but I do understand that she sometimes gets physically aggressive at home  with her brother and sister-in-law.  Memory:  Immediate;   Fair Recent;   Poor Remote;   Poor  Judgement:  Impaired  Insight:  Shallow  Psychomotor Activity:  Restlessness  Concentration:  Concentration: Poor  Recall:  Poor  Fund of Knowledge:  Poor  Language:  Poor  Akathisia:  No  Handed:  Right  AIMS (if indicated):     Assets:  Desire for Improvement Social Support  ADL's:  Impaired  Cognition:  Impaired,  Mild  Sleep:  Number of Hours: 6.25     Treatment Plan Summary: Daily contact with patient to assess and evaluate symptoms and progress in treatment, Medication management and Plan Patient seen and chart reviewed.  I had obviously intended to discharge the patient home today because I felt that she had probably improved as much as was possible in the hospital.  After speaking with the sister and brother-in-law they have expressed their concern that she needs to be a little bit better with less anger and threatening statements in order to go home.  Based on that I will discontinue the plan for discharge.  I am going to readjust her medicine a bit.  We will check her Depakote level tomorrow.  I am increasing her Seroquel dose to a total of 400 mg at night and discontinuing the 3 times a day order.  At some much more realistic dosing schedule.  I hope that we will achieve a little bit more calm this prior to discharging on most likely Monday.  It seems pretty clear that her group home is out of the question because the patient has behaved so badly in the past with group homes plus we really do not have access to them at this point.  Mordecai RasmussenJohn Clapacs, MD 03/07/2019, 12:59 PM

## 2019-03-07 NOTE — Progress Notes (Signed)
Recreation Therapy Notes   Date: 03/07/2019  Time: 9:30 am  Location: Craft room  Behavioral response: Appropriate   Intervention Topic: Teamwork   Discussion/Intervention:  Group content on today was focused on teamwork. The group identified what teamwork is. Individuals described who is a part of their team. Patients expressed why they thought teamwork is important. The group stated reasons why they thought it was easier to work with a Dance movement psychotherapist team. Individuals discussed some positives and negatives of working with a team. Patients gave examples of past experiences they had while working with a team. The group participated in the intervention "What is That", where patients were given a chance to point out qualities, they look for in a teammate and were able to work in teams with each other. Clinical Observations/Feedback:  Patient came to group late and stated team work is bad because she always does all the work. She decided not to participate in the activity and left group. Zandria Woldt LRT/CTRS         Lashawne Dura 03/07/2019 10:42 AM

## 2019-03-07 NOTE — [facility] Group Notes (Signed)
LCSW Group Therapy Note  03/07/2019 2:07 PM  Type of Therapy/Topic:  Group Therapy:  Balance in Life  Participation Level:  Minimal  Description of Group:    This group will address the concept of balance and how it feels and looks when one is unbalanced. Patients will be encouraged to process areas in their lives that are out of balance and identify reasons for remaining unbalanced. Facilitators will guide patients in utilizing problem-solving interventions to address and correct the stressor making their life unbalanced. Understanding and applying boundaries will be explored and addressed for obtaining and maintaining a balanced life. Patients will be encouraged to explore ways to assertively make their unbalanced needs known to significant others in their lives, using other group members and facilitator for support and feedback.  Therapeutic Goals: 1. Patient will identify two or more emotions or situations they have that consume much of in their lives. 2. Patient will identify signs/triggers that life has become out of balance:  3. Patient will identify two ways to set boundaries in order to achieve balance in their lives:  4. Patient will demonstrate ability to communicate their needs through discussion and/or role plays  Summary of Patient Progress: Pt was present in group, but chose not to participate. Pt did report "my family hates me" when asked what balance meant to her. Pt did not engage in the group activity or discussion.     Therapeutic Modalities:   Cognitive Behavioral Therapy Solution-Focused Therapy Assertiveness Training  Evalina Field, MSW, LCSW Clinical Social Work 03/07/2019 2:07 PM

## 2019-03-07 NOTE — Plan of Care (Signed)
Patient is appropriate in the unit.Patient denies any homicidal ideations but she thinks she is not ready to go home today.Visible in the milieu.Compliant with medications.Attended groups.Appetite and energy level good.Support and encouragement given.

## 2019-03-07 NOTE — Progress Notes (Signed)
Recreation Therapy Notes  INPATIENT RECREATION TR PLAN  Patient Details Name: Marco Adelson MRN: [id number] DOB: Jan 12, 1961 Today's Date: 03/07/2019  Rec Therapy Plan Is patient appropriate for Therapeutic Recreation?: Yes Treatment times per week: At least 3 Estimated Length of Stay: 5-7 days TR Treatment/Interventions: Group participation (Comment)  Discharge Criteria Pt will be discharged from therapy if:: Discharged Treatment plan/goals/alternatives discussed and agreed upon by:: Patient/family  Discharge Summary Short term goals set: Patient will engage in groups without prompting or encouragement from LRT x3 group sessions within 5 recreation therapy group sessions Short term goals met: Complete Progress toward goals comments: Groups attended Which groups?: Coping skills, Goal setting, Other (Comment)(Team work) Reason goals not met: N/A Therapeutic equipment acquired: N/A Reason patient discharged from therapy: Discharge from hospital Pt/family agrees with progress & goals achieved: Yes Date patient discharged from therapy: 03/07/19   Yanett Conkright 03/07/2019, 11:38 AM

## 2019-03-07 NOTE — Discharge Summary (Signed)
Physician Discharge Summary Note  Patient:  Jenna AblesLinda Riggs is an 58 y.o., female MRN:  [id number] DOB:  [birth year] Patient phone:  [phone] (home)  Patient address:   [address] [place] [address],  Total Time spent with patient: 45 minutes  Date of Admission:  03/01/2019 Date of Discharge: March 07, 2019  Reason for Admission: Patient with a history of impulse problems and developmental disability admitted to the hospital claiming suicidal ideation with labile mood.  Complaints about how she had been treated at her brother's home.  Principal Problem: Adjustment disorder with mixed disturbance of emotions and conduct Discharge Diagnoses: Principal Problem:   Adjustment disorder with mixed disturbance of emotions and conduct Active Problems:   Moderate intellectual disability   Bipolar affective disorder, mixed, severe (HCC)   Past Psychiatric History: Patient has chronic intellectual disability and also has been diagnosed with bipolar disorder in the past.  He has a history of impulsive behavior.  She has threatened self-harm and done nonlethal self-harm at times in the past under emotional stress.  Patient seems to show some improvement with mood stabilizing and antipsychotic medication.  Past Medical History:  Past Medical History:  Diagnosis Date  . Mental retardation   . Renal disorder   . Seizures (HCC)     Past Surgical History:  Procedure Laterality Date  . ANKLE FUSION Right    Family History:  Family History  Problem Relation Age of Onset  . Diabetes Mother   . Diabetes Father    Family Psychiatric  History: None reported Social History:  Social History   Substance and Sexual Activity  Alcohol Use No     Social History   Substance and Sexual Activity  Drug Use No    Social History   Socioeconomic History  . Marital status: Single    Spouse name: Not on file  . Number of children: Not on file  . Years of education: Not on file  . Highest  education level: Not on file  Occupational History  . Not on file  Social Needs  . Financial resource strain: Not very hard  . Food insecurity    Worry: Patient refused    Inability: Patient refused  . Transportation needs    Medical: Patient refused    Non-medical: Patient refused  Tobacco Use  . Smoking status: Current Every Day Smoker    Packs/day: 1.00    Years: 35.00    Pack years: 35.00    Types: Cigarettes  . Smokeless tobacco: Never Used  . Tobacco comment: Patient refused  Substance and Sexual Activity  . Alcohol use: No  . Drug use: No  . Sexual activity: Never    Birth control/protection: None  Lifestyle  . Physical activity    Days per week: Patient refused    Minutes per session: Patient refused  . Stress: To some extent  Relationships  . Social Musicianconnections    Talks on phone: Patient refused    Gets together: Patient refused    Attends religious service: Patient refused    Active member of club or organization: Patient refused    Attends meetings of clubs or organizations: Patient refused    Relationship status: Patient refused  Other Topics Concern  . Not on file  Social History Narrative  . Not on file    Hospital Course: Patient did not display any dangerous behavior in the hospital.  She was not aggressive violent suicidal or threatening.  She was compliant with medicine.  Basic ADLs were adequate.  Attended groups to such an extent as she was able.  Over the weekend some small changes were made to her antipsychotic medication otherwise minimal change to treatment.  Patient appears to be back at her baseline.  When told that she is being discharged she will still make comments such as threatening to poison her brother and his wife with perfume and then run away.  This threat seems unrealistic and a product of her intellectual disability.  Family is agreeable to having her come back and are her guardian.  Patient has been coached and counseled multiple times  about the need to make the best of her situation and keep her temper under control.  She has follow-up in the community and prescriptions are being provided at discharge.  Physical Findings: AIMS: Facial and Oral Movements Muscles of Facial Expression: None, normal Lips and Perioral Area: None, normal Jaw: None, normal Tongue: None, normal,Extremity Movements Upper (arms, wrists, hands, fingers): None, normal Lower (legs, knees, ankles, toes): None, normal, Trunk Movements Neck, shoulders, hips: None, normal, Overall Severity Severity of abnormal movements (highest score from questions above): None, normal Incapacitation due to abnormal movements: None, normal Patient's awareness of abnormal movements (rate only patient's report): No Awareness, Dental Status Current problems with teeth and/or dentures?: No Does patient usually wear dentures?: No  CIWA:    COWS:     Musculoskeletal: Strength & Muscle Tone: within normal limits Gait & Station: normal Patient leans: N/A  Psychiatric Specialty Exam: Physical Exam  Nursing note and vitals reviewed. Constitutional: She appears well-developed and well-nourished.  HENT:  Head: Normocephalic and atraumatic.  Eyes: Pupils are equal, round, and reactive to light. Conjunctivae are normal.  Neck: Normal range of motion.  Cardiovascular: Regular rhythm and normal heart sounds.  Respiratory: Effort normal. No respiratory distress.  GI: Soft.  Musculoskeletal: Normal range of motion.  Neurological: She is alert.  Skin: Skin is warm and dry.  Psychiatric: Her affect is labile. Her speech is tangential. She is agitated. She is not aggressive. Thought content is not paranoid. Cognition and memory are impaired. She expresses impulsivity. She expresses no homicidal and no suicidal ideation. She exhibits normal recent memory.    Review of Systems  Constitutional: Negative.   HENT: Negative.   Eyes: Negative.   Respiratory: Negative.    Cardiovascular: Negative.   Gastrointestinal: Negative.   Musculoskeletal: Negative.   Skin: Negative.   Neurological: Negative.   Psychiatric/Behavioral: Negative.     Blood pressure 107/83, pulse 84, temperature 98.7 F (37.1 C), temperature source Oral, resp. rate 18, height 5\' 3"  (1.6 m), weight 101.2 kg, SpO2 95 %.Body mass index is 39.5 kg/m.  General Appearance: Casual  Eye Contact:  Good  Speech:  Clear and Coherent  Volume:  Normal  Mood:  Euthymic  Affect:  Constricted  Thought Process:  Goal Directed  Orientation:  Full (Time, Place, and Person)  Thought Content:  Logical  Suicidal Thoughts:  No  Homicidal Thoughts:  Yes.  without intent/plan  Memory:  Immediate;   Fair Recent;   Fair Remote;   Fair  Judgement:  Fair  Insight:  Fair  Psychomotor Activity:  Normal  Concentration:  Concentration: Fair  Recall:  [employer] of Knowledge:  Poor  Language:  Fair  Akathisia:  No  Handed:  Right  AIMS (if indicated):     Assets:  Desire for Improvement Housing Resilience  ADL's:  Intact  Cognition:  Impaired,  Mild  Sleep:  Number of Hours: 6.25     Have you used any form of tobacco in the last 30 days? (Cigarettes, Smokeless Tobacco, Cigars, and/or Pipes): Yes  Has this patient used any form of tobacco in the last 30 days? (Cigarettes, Smokeless Tobacco, Cigars, and/or Pipes) Yes, Yes, A prescription for an FDA-approved tobacco cessation medication was offered at discharge and the patient refused  Blood Alcohol level:  Lab Results  Component Value Date   [facility] <[facility] 02/27/2019   ETH <10 11/12/2018    Metabolic Disorder Labs:  Lab Results  Component Value Date   HGBA1C 5.3 09/19/2016   MPG 105 09/19/2016   Lab Results  Component Value Date   PROLACTIN 12.8 09/19/2016   Lab Results  Component Value Date   CHOL 144 09/19/2016   TRIG 123 09/19/2016   HDL 44 09/19/2016   CHOLHDL 3.3 09/19/2016   VLDL 25 09/19/2016   LDLCALC 75 09/19/2016   LDLCALC  76 02/14/2015    See Psychiatric Specialty Exam and Suicide Risk Assessment completed by Attending Physician prior to discharge.  Discharge destination:  Home  Is patient on multiple antipsychotic therapies at discharge:  Yes,   Do you recommend tapering to monotherapy for antipsychotics?  Yes   Has Patient had three or more failed trials of antipsychotic monotherapy by history:  No  Recommended Plan for Multiple Antipsychotic Therapies: Taper to monotherapy as described:  Recommend considering tapering off of 1 of the other antipsychotics.  Perphenazine is newly added and perhaps could be tapered and gradually replaced again with Seroquel as appropriate in the outpatient setting.  Discharge Instructions    Diet - low sodium heart healthy   Complete by: As directed    Increase activity slowly   Complete by: As directed      Allergies as of 03/07/2019   No Known Allergies     Medication List    STOP taking these medications   cyanocobalamin 1000 MCG tablet     TAKE these medications     Indication  aspirin EC 81 MG tablet Take 1 tablet (81 mg total) by mouth daily.  Indication: Stable Angina Pectoris   divalproex 500 MG DR tablet Commonly known as: DEPAKOTE Take 1 tablet (500 mg total) by mouth 2 (two) times daily. What changed:   medication strength  how much to take  Another medication with the same name was removed. Continue taking this medication, and follow the directions you see here.  Indication: Schizophrenia   lisinopril 10 MG tablet Commonly known as: ZESTRIL Take 1 tablet (10 mg total) by mouth daily.  Indication: High Blood Pressure Disorder   perphenazine 2 MG tablet Commonly known as: TRILAFON Take 3 tablets (6 mg total) by mouth at bedtime.  Indication: Psychosis   QUEtiapine 100 MG tablet Commonly known as: SEROQUEL Take 1 tablet (100 mg total) by mouth 3 (three) times daily. What changed: when to take this  Indication: Depressive Phase of  Manic-Depression   traZODone 100 MG tablet Commonly known as: DESYREL Take 3 tablets (300 mg total) by mouth at bedtime.  Indication: Trouble Sleeping      Follow-up Information    [facility].   Why: [facility] on Wednesday, July 15th at 9:20am. Please take your hospital discharge paperwork with you to your appointment. Thank you. Contact information: [address] [place] [address] [phone]  Follow-up recommendations:  Activity:  Activity as tolerated Diet:  Regular diet Other:  Follow-up with outpatient provider  Comments: Follow-up with outpatient psychiatric as previously.  Signed: Mordecai RasmussenJohn Clapacs, MD 03/07/2019, 10:38 AM

## 2019-03-08 LAB — VALPROIC ACID LEVEL: Valproic Acid Lvl: 85 ug/mL (ref 50.0–100.0)

## 2019-03-08 MED ORDER — MENTHOL 3 MG MT LOZG
1.0000 | LOZENGE | OROMUCOSAL | Status: DC | PRN
  Filled 2019-03-08: qty 9

## 2019-03-08 NOTE — Progress Notes (Signed)
D: Patient has been calm and cooperative. Stating that she does not want to go home. Claimed that she vomited in the toilet but no vomit was witnessed. Denies SI, HI and AVH. A: Continue to monitor for safety. R: Safety maintained.

## 2019-03-08 NOTE — [facility] Group Notes (Signed)
Feelings Around Relapse 03/08/2019 1PM  Type of Therapy and Topic:  Group Therapy:  Feelings around Relapse and Recovery  Participation Level:  Did Not Attend   Description of Group:    Patients in this group will discuss emotions they experience before and after a relapse. They will process how experiencing these feelings, or avoidance of experiencing them, relates to having a relapse. Facilitator will guide patients to explore emotions they have related to recovery. Patients will be encouraged to process which emotions are more powerful. They will be guided to discuss the emotional reaction significant others in their lives may have to patients' relapse or recovery. Patients will be assisted in exploring ways to respond to the emotions of others without this contributing to a relapse.  Therapeutic Goals: 1. Patient will identify two or more emotions that lead to a relapse for them 2. Patient will identify two emotions that result when they relapse 3. Patient will identify two emotions related to recovery 4. Patient will demonstrate ability to communicate their needs through discussion and/or role plays   Summary of Patient Progress:     Therapeutic Modalities:   Cognitive Behavioral Therapy Solution-Focused Therapy Assertiveness Training Relapse Prevention Therapy   Yvette Rack, LCSW 03/08/2019 1:52 PM

## 2019-03-08 NOTE — Progress Notes (Signed)
[facility] MD Progress Note  7/10/202[facility]0 4:30 PM Jenna AblesLinda Riggs  MRN:  [id number] Subjective: Follow-up for this patient with intellectual disability.  Patient's complaint today is that her throat is sore.  She denies any lung problems denies coughing denies feeling sick in general.  Vitals are normal no sign of any fever.  Patient will be given Cepacol lozenges for now and we will follow-up on whether she develops any other signs of illness.  Patient's blood level of valproic acid came back at 86.  She is not agitated today.  Does not become agitated when talking about her family.  Denies any suicidal or homicidal thoughts Principal Problem: Adjustment disorder with mixed disturbance of emotions and conduct Diagnosis: Principal Problem:   Adjustment disorder with mixed disturbance of emotions and conduct Active Problems:   Moderate intellectual disability   Bipolar affective disorder, mixed, severe (HCC)  Total Time spent with patient: 20 minutes  Past Psychiatric History: Past history of intellectual disability and behavior problem  Past Medical History:  Past Medical History:  Diagnosis Date  . Mental retardation   . Renal disorder   . Seizures (HCC)     Past Surgical History:  Procedure Laterality Date  . ANKLE FUSION Right    Family History:  Family History  Problem Relation Age of Onset  . Diabetes Mother   . Diabetes Father    Family Psychiatric  History: None reported Social History:  Social History   Substance and Sexual Activity  Alcohol Use No     Social History   Substance and Sexual Activity  Drug Use No    Social History   Socioeconomic History  . Marital status: Single    Spouse name: Not on file  . Number of children: Not on file  . Years of education: Not on file  . Highest education level: Not on file  Occupational History  . Not on file  Social Needs  . Financial resource strain: Not very hard  . Food insecurity    Worry: Patient refused     Inability: Patient refused  . Transportation needs    Medical: Patient refused    Non-medical: Patient refused  Tobacco Use  . Smoking status: Current Every Day Smoker    Packs/day: 1.00    Years: 35.00    Pack years: 35.00    Types: Cigarettes  . Smokeless tobacco: Never Used  . Tobacco comment: Patient refused  Substance and Sexual Activity  . Alcohol use: No  . Drug use: No  . Sexual activity: Never    Birth control/protection: None  Lifestyle  . Physical activity    Days per week: Patient refused    Minutes per session: Patient refused  . Stress: To some extent  Relationships  . Social Musicianconnections    Talks on phone: Patient refused    Gets together: Patient refused    Attends religious service: Patient refused    Active member of club or organization: Patient refused    Attends meetings of clubs or organizations: Patient refused    Relationship status: Patient refused  Other Topics Concern  . Not on file  Social History Narrative  . Not on file   Additional Social History:  Specify valuables returned: hair bow,chips and bottle water                      Sleep: Fair  Appetite:  Fair  Current Medications: Current Facility-Administered Medications  Medication Dose Route Frequency Provider Last  Rate Last Dose  . acetaminophen (TYLENOL) tablet 650 mg  650 mg Oral Q6H PRN Charm RingsLord, Jamison Y, NP   650 mg at 03/04/19 [id number]  . alum & mag hydroxide-simeth (MAALOX/MYLANTA) 200-200-20 MG/5ML suspension 30 mL  30 mL Oral Q4H PRN Charm RingsLord, Jamison Y, NP      . aspirin EC tablet 81 mg  81 mg Oral Daily Charm RingsLord, Jamison Y, NP   81 mg at 03/08/19 0802  . divalproex (DEPAKOTE) DR tablet 500 mg  500 mg Oral BID Charm RingsLord, Jamison Y, NP   500 mg at 03/08/19 0802  . lisinopril (ZESTRIL) tablet 10 mg  10 mg Oral Daily Charm RingsLord, Jamison Y, NP   10 mg at 03/08/19 0802  . magnesium hydroxide (MILK OF MAGNESIA) suspension 30 mL  30 mL Oral Daily PRN Charm RingsLord, Jamison Y, NP      .  menthol-cetylpyridinium (CEPACOL) lozenge 3 mg  1 lozenge Oral PRN Clapacs, John T, MD      . QUEtiapine (SEROQUEL) tablet 400 mg  400 mg Oral QHS Clapacs, Jackquline DenmarkJohn T, MD   400 mg at 03/07/19 2107  . traZODone (DESYREL) tablet 300 mg  300 mg Oral QHS Charm RingsLord, Jamison Y, NP   300 mg at 03/07/19 2107    Lab Results:  Results for orders placed or performed during the hospital encounter of 03/01/19 (from the past 48 hour(s))  Valproic acid level     Status: None   Collection Time: 03/08/19  9:01 AM  Result Value Ref Range   Valproic Acid Lvl 85 50.0 - 100.0 ug/mL    Comment: Performed at [facility], [address] [place], [place] [address]    Blood Alcohol level:  Lab Results  Component Value Date   [facility] 02/27/2019   ETH <10 11/12/2018    Metabolic Disorder Labs: Lab Results  Component Value Date   HGBA1C 5.3 09/19/2016   MPG 105 09/19/2016   Lab Results  Component Value Date   PROLACTIN 12.8 09/19/2016   Lab Results  Component Value Date   CHOL 144 09/19/2016   TRIG 123 09/19/2016   HDL 44 09/19/2016   CHOLHDL 3.3 09/19/2016   VLDL 25 09/19/2016   LDLCALC 75 09/19/2016   LDLCALC 76 02/14/2015    Physical Findings: AIMS: Facial and Oral Movements Muscles of Facial Expression: None, normal Lips and Perioral Area: None, normal Jaw: None, normal Tongue: None, normal,Extremity Movements Upper (arms, wrists, hands, fingers): None, normal Lower (legs, knees, ankles, toes): None, normal, Trunk Movements Neck, shoulders, hips: None, normal, Overall Severity Severity of abnormal movements (highest score from questions above): None, normal Incapacitation due to abnormal movements: None, normal Patient's awareness of abnormal movements (rate only patient's report): No Awareness, Dental Status Current problems with teeth and/or dentures?: No Does patient usually wear dentures?: No  CIWA:    COWS:     Musculoskeletal: Strength & Muscle Tone: within normal  limits Gait & Station: normal Patient leans: N/A  Psychiatric Specialty Exam: Physical Exam  Nursing note and vitals reviewed. Constitutional: She appears well-developed and well-nourished.  HENT:  Head: Normocephalic and atraumatic.  Eyes: Pupils are equal, round, and reactive to light. Conjunctivae are normal.  Neck: Normal range of motion.  Cardiovascular: Regular rhythm and normal heart sounds.  Respiratory: Effort normal.  GI: Soft.  Musculoskeletal: Normal range of motion.  Neurological: She is alert.  Skin: Skin is warm and dry.  Psychiatric: Her affect is blunt. Her speech is delayed. She is slowed. Thought  content is not paranoid. Cognition and memory are impaired. She expresses impulsivity. She expresses no homicidal and no suicidal ideation.    Review of Systems  Constitutional: Negative.   HENT: Positive for sore throat. Negative for congestion, nosebleeds and sinus pain.   Eyes: Negative.   Respiratory: Negative.  Negative for stridor.   Cardiovascular: Negative.   Gastrointestinal: Negative.   Musculoskeletal: Negative.   Skin: Negative.   Neurological: Negative.   Psychiatric/Behavioral: Negative.     Blood pressure 126/68, pulse 85, temperature 98.7 F (37.1 C), temperature source Oral, resp. rate 18, height 5\' 3"  (1.6 m), weight 101.2 kg, SpO2 96 %.Body mass index is 39.5 kg/m.  General Appearance: Casual  Eye Contact:  Fair  Speech:  Slow  Volume:  Decreased  Mood:  Euthymic  Affect:  Constricted  Thought Process:  Coherent  Orientation:  Full (Time, Place, and Person)  Thought Content:  Illogical  Suicidal Thoughts:  No  Homicidal Thoughts:  No  Memory:  Immediate;   Fair Recent;   Fair Remote;   Fair  Judgement:  Impaired  Insight:  Fair  Psychomotor Activity:  Decreased  Concentration:  Concentration: Fair  Recall:  [employer] of Knowledge:  Poor  Language:  Poor  Akathisia:  No  Handed:  Right  AIMS (if indicated):     Assets:  Desire  for Improvement Housing  ADL's:  Impaired  Cognition:  Impaired,  Mild  Sleep:  Number of Hours: 6.25     Treatment Plan Summary: Daily contact with patient to assess and evaluate symptoms and progress in treatment, Medication management and Plan Obviously complaints about sore throat raise the issue of possible viral infection.  Patient is not reporting any other other symptoms of upper respiratory infection at this time and her temperature has been normal.  We will watch and make sure that this does not develop into something worse.  Meanwhile her valproic acid level seems to be appropriate.  No change to psychiatric medicine.  Appears to be calling back down and we are likely to aim for discharge on Monday  Alethia Berthold, MD 03/08/2019, 4:30 PM

## 2019-03-08 NOTE — Plan of Care (Signed)
Patient refused lab this morning.Patient came to staff stated that she is ready for lab to draw the blood.Patient stated that she does not want to go back home because her brother is after her always.Patient denies SI,HI and AVH.Visible in the milieu.Appropriate with staff & peers.Support and encouragement given.

## 2019-03-08 NOTE — Tx Team (Signed)
Interdisciplinary Treatment and Diagnostic Plan Update  03/08/2019 Time of Session: 8:30am Jenna AblesLinda Riggs MRN: [id number]  Principal Diagnosis: Adjustment disorder with mixed disturbance of emotions and conduct  Secondary Diagnoses: Principal Problem:   Adjustment disorder with mixed disturbance of emotions and conduct Active Problems:   Moderate intellectual disability   Bipolar affective disorder, mixed, severe (HCC)   Current Medications:  Current Facility-Administered Medications  Medication Dose Route Frequency Provider Last Rate Last Dose  . acetaminophen (TYLENOL) tablet 650 mg  650 mg Oral Q6H PRN Charm RingsLord, Jamison Y, NP   650 mg at 03/04/19 [id number]  . alum & mag hydroxide-simeth (MAALOX/MYLANTA) 200-200-20 MG/5ML suspension 30 mL  30 mL Oral Q4H PRN Charm RingsLord, Jamison Y, NP      . aspirin EC tablet 81 mg  81 mg Oral Daily Charm RingsLord, Jamison Y, NP   81 mg at 03/08/19 0802  . divalproex (DEPAKOTE) DR tablet 500 mg  500 mg Oral BID Charm RingsLord, Jamison Y, NP   500 mg at 03/08/19 0802  . lisinopril (ZESTRIL) tablet 10 mg  10 mg Oral Daily Charm RingsLord, Jamison Y, NP   10 mg at 03/08/19 0802  . magnesium hydroxide (MILK OF MAGNESIA) suspension 30 mL  30 mL Oral Daily PRN Charm RingsLord, Jamison Y, NP      . QUEtiapine (SEROQUEL) tablet 400 mg  400 mg Oral QHS Clapacs, Jackquline DenmarkJohn T, MD   400 mg at 03/07/19 2107  . traZODone (DESYREL) tablet 300 mg  300 mg Oral QHS Charm RingsLord, Jamison Y, NP   300 mg at 03/07/19 2107   PTA Medications: Medications Prior to Admission  Medication Sig Dispense Refill Last Dose  . divalproex (DEPAKOTE ER) 250 MG 24 hr tablet Take 3 tablets (750 mg total) by mouth daily at 6 PM. (Patient not taking: Reported on 11/12/2018) 90 tablet 0   . divalproex (DEPAKOTE) 250 MG DR tablet Take 1 tablet by mouth 2 (two) times daily.     . divalproex (DEPAKOTE) 500 MG DR tablet Take 1 tablet (500 mg total) by mouth every morning. (Patient not taking: Reported on 11/12/2018) 30 tablet 0   . QUEtiapine (SEROQUEL) 100 MG tablet  Take 1 tablet (100 mg total) by mouth 2 (two) times daily. (Patient taking differently: Take 150 mg by mouth 2 (two) times daily. ) 60 tablet 1   . vitamin B-12 1000 MCG tablet Take 1 tablet (1,000 mcg total) by mouth daily. (Patient not taking: Reported on 02/28/2019) 30 tablet 1   . [DISCONTINUED] aspirin EC 81 MG tablet Take 81 mg by mouth daily.     . [DISCONTINUED] lisinopril (PRINIVIL,ZESTRIL) 10 MG tablet Take 10 mg by mouth daily.     . [DISCONTINUED] traZODone (DESYREL) 100 MG tablet Take 300 mg by mouth at bedtime.       Patient Stressors: Marital or family conflict  Patient Strengths: Religious Affiliation Supportive family/friends  Treatment Modalities: Medication Management, Group therapy, Case management,  1 to 1 session with clinician, Psychoeducation, Recreational therapy.   Physician Treatment Plan for Primary Diagnosis: Adjustment disorder with mixed disturbance of emotions and conduct Long Term Goal(s): Improvement in symptoms so as ready for discharge Improvement in symptoms so as ready for discharge   Short Term Goals: Ability to disclose and discuss suicidal ideas Ability to demonstrate self-control will improve Ability to identify and develop effective coping behaviors will improve Ability to demonstrate self-control will improve Ability to identify and develop effective coping behaviors will improve Ability to maintain clinical measurements within normal limits  will improve Compliance with prescribed medications will improve  Medication Management: Evaluate patient's response, side effects, and tolerance of medication regimen.  Therapeutic Interventions: 1 to 1 sessions, Unit Group sessions and Medication administration.  Evaluation of Outcomes: Progressing  Physician Treatment Plan for Secondary Diagnosis: Principal Problem:   Adjustment disorder with mixed disturbance of emotions and conduct Active Problems:   Moderate intellectual disability   Bipolar  affective disorder, mixed, severe ([place])  Long Term Goal(s): Improvement in symptoms so as ready for discharge Improvement in symptoms so as ready for discharge   Short Term Goals: Ability to disclose and discuss suicidal ideas Ability to demonstrate self-control will improve Ability to identify and develop effective coping behaviors will improve Ability to demonstrate self-control will improve Ability to identify and develop effective coping behaviors will improve Ability to maintain clinical measurements within normal limits will improve Compliance with prescribed medications will improve     Medication Management: Evaluate patient's response, side effects, and tolerance of medication regimen.  Therapeutic Interventions: 1 to 1 sessions, Unit Group sessions and Medication administration.  Evaluation of Outcomes: Progressing   RN Treatment Plan for Primary Diagnosis: Adjustment disorder with mixed disturbance of emotions and conduct Long Term Goal(s): Knowledge of disease and therapeutic regimen to maintain health will improve  Short Term Goals: Ability to demonstrate self-control, Ability to participate in decision making will improve, Ability to verbalize feelings will improve, Ability to identify and develop effective coping behaviors will improve and Compliance with prescribed medications will improve  Medication Management: RN will administer medications as ordered by provider, will assess and evaluate patient's response and provide education to patient for prescribed medication. RN will report any adverse and/or side effects to prescribing provider.  Therapeutic Interventions: 1 on 1 counseling sessions, Psychoeducation, Medication administration, Evaluate responses to treatment, Monitor vital signs and CBGs as ordered, Perform/monitor CIWA, COWS, AIMS and Fall Risk screenings as ordered, Perform wound care treatments as ordered.  Evaluation of Outcomes: Progressing   LCSW  Treatment Plan for Primary Diagnosis: Adjustment disorder with mixed disturbance of emotions and conduct Long Term Goal(s): Safe transition to appropriate next level of care at discharge, Engage patient in therapeutic group addressing interpersonal concerns.  Short Term Goals: Engage patient in aftercare planning with referrals and resources  Therapeutic Interventions: Assess for all discharge needs, 1 to 1 time with Social worker, Explore available resources and support systems, Assess for adequacy in community support network, Educate family and significant other(s) on suicide prevention, Complete Psychosocial Assessment, Interpersonal group therapy.  Evaluation of Outcomes: Progressing   Progress in Treatment: Attending groups: Yes. Participating in groups: Yes. Taking medication as prescribed: Yes. Toleration medication: Yes. Family/Significant other contact made: Yes, individual(s) contacted:  [name] and Haynes Hoehn, legal guardian Patient understands diagnosis: Yes. Discussing patient identified problems/goals with staff: No. Medical problems stabilized or resolved: No. Denies suicidal/homicidal ideation: Unknown Issues/concerns per patient self-inventory: No. Other: NA  New problem(s) identified: Yes, Describe:  patient was expected to be discharged however, expressed a desire to poison her brother and sister in law.   New Short Term/Long Term Goal(s): elimination of symptoms of psychosis, medication management for mood stabilization; elimination of SI thoughts; development of comprehensive mental wellness plan.  Patient Goals:  "get back on track"  Discharge Plan or Barriers: Pt will return home and follow up at [facility]. Update 03/08/2019:  Patient was to be discharged on 03/07/2019, however, when informed of this made statements that "I am going to posion them [brother and sister-in-law]  with perfume.  Family was aligned with picking her up, however, later family called back to state  concerns and asked that patient remain on the unit longer. Aftercarea ppointment has been set up with [facility] in [place].   Reason for Continuation of Hospitalization: Medication stabilization  Estimated Length of Stay: 5-10 days    Attendees: Patient: 03/08/2019 12:55 PM  Physician: Dr. Toni Amendlapacs, MD 03/08/2019 12:55 PM  Nursing:  03/08/2019 12:55 PM  RN Care Manager: 03/08/2019 12:55 PM  Social Worker: Penni HomansMichaela Nabria Nevin LCSW 03/08/2019 12:55 PM  Recreational Therapist:  03/08/2019 12:55 PM  Other:  03/08/2019 12:55 PM  Other:  03/08/2019 12:55 PM  Other: 03/08/2019 12:55 PM    Scribe for Treatment Team: Harden MoMichaela J Deania Siguenza, LCSW 03/08/2019 12:55 PM

## 2019-03-08 NOTE — Progress Notes (Signed)
Recreation Therapy Notes  Date: 03/08/2019  Time: 9:30 am  Location: Craft room  Behavioral response: Appropriate   Intervention Topic: Relaxation    Discussion/Intervention:  Group content today was focused on relaxation. The group defined relaxation and identified healthy ways to relax. Individuals expressed how much time they spend relaxing. Patients expressed how much their life would be if they did not make time for themselves to relax. The group stated ways they could improve their relaxation techniques in the future.  Individuals participated in the intervention "Time to Relax" where they had a chance to experience different relaxation techniques.  Clinical Observations/Feedback:  Patient came to group and defined relaxation as taking a break for self. She stated that relaxation is important to clear your mind. Individual did not participate in the activity she only watched her peers engage.  Maryem Shuffler LRT/CTRS         Fleeta Kunde 03/08/2019 11:22 AM

## 2019-03-08 NOTE — Plan of Care (Signed)
  Problem: Education: Goal: Utilization of techniques to improve thought processes will improve Outcome: Progressing Goal: Knowledge of the prescribed therapeutic regimen will improve Outcome: Progressing  D: Patient has been calm and cooperative. Stating that she does not want to go home. Claimed that she vomited in the toilet but no vomit was witnessed. Denies SI, HI and AVH. A: Continue to monitor for safety. R: Safety maintained.

## 2019-03-09 NOTE — Progress Notes (Signed)
[facility] MD Progress Note  03/09/2019 8:30 AM Jenna Riggs  MRN:  [id number] Subjective:   Patient has no upper respiratory complaints today she is less somatically focused, her Depakote level is therapeutic and she is informed of this.  She continues to focus on not going back to her past living situation but is instructed this may be the only option she has.  Calm and generally redirectable denies auditory or visual hallucinations denies wanting to harm self Principal Problem: Adjustment disorder with mixed disturbance of emotions and conduct Diagnosis: Principal Problem:   Adjustment disorder with mixed disturbance of emotions and conduct Active Problems:   Moderate intellectual disability   Bipolar affective disorder, mixed, severe (HCC)  Total Time spent with patient: 20 minutes  Past Medical History:  Past Medical History:  Diagnosis Date  . Mental retardation   . Renal disorder   . Seizures ([place])     Past Surgical History:  Procedure Laterality Date  . ANKLE FUSION Right    Family History:  Family History  Problem Relation Age of Onset  . Diabetes Mother   . Diabetes Father    Family Psychiatric  History: no new data Social History:  Social History   Substance and Sexual Activity  Alcohol Use No     Social History   Substance and Sexual Activity  Drug Use No    Social History   Socioeconomic History  . Marital status: Single    Spouse name: Not on file  . Number of children: Not on file  . Years of education: Not on file  . Highest education level: Not on file  Occupational History  . Not on file  Social Needs  . Financial resource strain: Not very hard  . Food insecurity    Worry: Patient refused    Inability: Patient refused  . Transportation needs    Medical: Patient refused    Non-medical: Patient refused  Tobacco Use  . Smoking status: Current Every Day Smoker    Packs/day: 1.00    Years: 35.00    Pack years: 35.00    Types: Cigarettes  .  Smokeless tobacco: Never Used  . Tobacco comment: Patient refused  Substance and Sexual Activity  . Alcohol use: No  . Drug use: No  . Sexual activity: Never    Birth control/protection: None  Lifestyle  . Physical activity    Days per week: Patient refused    Minutes per session: Patient refused  . Stress: To some extent  Relationships  . Social Herbalist on phone: Patient refused    Gets together: Patient refused    Attends religious service: Patient refused    Active member of club or organization: Patient refused    Attends meetings of clubs or organizations: Patient refused    Relationship status: Patient refused  Other Topics Concern  . Not on file  Social History Narrative  . Not on file   Additional Social History:  Specify valuables returned: hair bow,chips and bottle water                      Sleep: Fair  Appetite:  Fair  Current Medications: Current Facility-Administered Medications  Medication Dose Route Frequency Provider Last Rate Last Dose  . acetaminophen (TYLENOL) tablet 650 mg  650 mg Oral Q6H PRN [name], NP   650 mg at 03/04/19 5462  . alum & mag hydroxide-simeth (MAALOX/MYLANTA) 200-200-20 MG/5ML suspension 30 mL  30  mL Oral Q4H PRN Charm RingsLord, Jamison Y, NP      . aspirin EC tablet 81 mg  81 mg Oral Daily Charm RingsLord, Jamison Y, NP   81 mg at 03/09/19 0814  . divalproex (DEPAKOTE) DR tablet 500 mg  500 mg Oral BID Charm RingsLord, Jamison Y, NP   500 mg at 03/09/19 0814  . lisinopril (ZESTRIL) tablet 10 mg  10 mg Oral Daily Charm RingsLord, Jamison Y, NP   10 mg at 03/09/19 0814  . magnesium hydroxide (MILK OF MAGNESIA) suspension 30 mL  30 mL Oral Daily PRN Charm RingsLord, Jamison Y, NP      . menthol-cetylpyridinium (CEPACOL) lozenge 3 mg  1 lozenge Oral PRN Clapacs, John T, MD      . QUEtiapine (SEROQUEL) tablet 400 mg  400 mg Oral QHS Clapacs, Jackquline DenmarkJohn T, MD   400 mg at 03/08/19 2221  . traZODone (DESYREL) tablet 300 mg  300 mg Oral QHS Charm RingsLord, Jamison Y, NP   300 mg  at 03/08/19 2221    Lab Results:  Results for orders placed or performed during the hospital encounter of 03/01/19 (from the past 48 hour(s))  Valproic acid level     Status: None   Collection Time: 03/08/19  9:01 AM  Result Value Ref Range   Valproic Acid Lvl 85 50.0 - 100.0 ug/mL    Comment: Performed at [facility], [address] [place], [place] [address]    Blood Alcohol level:  Lab Results  Component Value Date   [facility]   ETH <10 11/12/2018    Metabolic Disorder Labs: Lab Results  Component Value Date   HGBA1C 5.3 09/19/2016   MPG 105 09/19/2016   Lab Results  Component Value Date   PROLACTIN 12.8 09/19/2016   Lab Results  Component Value Date   CHOL 144 09/19/2016   TRIG 123 09/19/2016   HDL 44 09/19/2016   CHOLHDL 3.3 09/19/2016   VLDL 25 09/19/2016   LDLCALC 75 09/19/2016   LDLCALC 76 02/14/2015    Physical Findings: AIMS: Facial and Oral Movements Muscles of Facial Expression: None, normal Lips and Perioral Area: None, normal Jaw: None, normal Tongue: None, normal,Extremity Movements Upper (arms, wrists, hands, fingers): None, normal Lower (legs, knees, ankles, toes): None, normal, Trunk Movements Neck, shoulders, hips: None, normal, Overall Severity Severity of abnormal movements (highest score from questions above): None, normal Incapacitation due to abnormal movements: None, normal Patient's awareness of abnormal movements (rate only patient's report): No Awareness, Dental Status Current problems with teeth and/or dentures?: No Does patient usually wear dentures?: No  CIWA:    COWS:     Musculoskeletal: Strength & Muscle Tone: within normal limits Gait & Station: normal Patient leans: N/A  Psychiatric Specialty Exam: Physical Exam  ROS  Blood pressure 112/75, pulse 81, temperature 98 F (36.7 C), temperature source Oral, resp. rate 18, height 5\' 3"  (1.6 m), weight 101.2 kg, SpO2 96 %.Body mass index is 39.5 kg/m.   General Appearance: Casual  Eye Contact:  Good  Speech:  Clear and Coherent  Volume:  Increased  Mood:  Anxious and Euthymic  Affect:  Appropriate and Congruent  Thought Process:  Irrelevant and Descriptions of Associations: Circumstantial  Orientation:  Full (Time, Place, and Person)  Thought Content:  Logical  Suicidal Thoughts:  No  Homicidal Thoughts:  No  Memory:  Immediate;   Fair  Judgement:  Fair  Insight:  Fair  Psychomotor Activity:  Normal  Concentration:  Concentration: Fair  Recall:  Fair  [employer] of Knowledge:  Fair  Language:  Fair  Akathisia:  Negative  Handed:  Right  AIMS (if indicated):     Assets:  Resilience Social Support  ADL's:  Intact  Cognition:  WNL  Sleep:  Number of Hours: 6     Treatment Plan Summary: Daily contact with patient to assess and evaluate symptoms and progress in treatment, Medication management and Plan Continue cognitive and reality based therapies no change in medications today continue to monitor for safety probable discharge Monday according the patient's understanding of discharge planning  Dionna Wiedemann, MD 03/09/2019, 8:30 AM

## 2019-03-09 NOTE — Progress Notes (Signed)
D - Patient was outside upon arrival to the unit with the group. Patient was pleasant during assessment and medication administration. Patient denies SI/HI/AVH and pain with this Probation officer. Patient was unable to say if she had depression or anxiety. Patient stated this day was better than the last one. She is hopeful to be leaving Monday.   A - Patient compliant with medication administration per MD orders and procedures on the unit. Patient given education. Patient given support and encouragement to be active in her treatment plan. Patient informed to let staff know if there are any issues or problems on the unit.   R - Patient being monitored Q 15 minutes for safety per unit protocol. Patient remains safe on the unit.

## 2019-03-09 NOTE — [facility] Group Notes (Signed)
LCSW Group Therapy Note   03/09/2019 1:15pm   Type of Therapy and Topic:  Group Therapy:  Trust and Honesty  Participation Level:  Active  Description of Group:    In this group patients will be asked to explore the value of being honest.  Patients will be guided to discuss their thoughts, feelings, and behaviors related to honesty and trusting in others. Patients will process together how trust and honesty relate to forming relationships with peers, family members, and self. Each patient will be challenged to identify and express feelings of being vulnerable. Patients will discuss reasons why people are dishonest and identify alternative outcomes if one was truthful (to self or others). This group will be process-oriented, with patients participating in exploration of their own experiences, giving and receiving support, and processing challenge from other group members.   Therapeutic Goals: 1. Patient will identify why honesty is important to relationships and how honesty overall affects relationships.  2. Patient will identify a situation where they lied or were lied too and the  feelings, thought process, and behaviors surrounding the situation 3. Patient will identify the meaning of being vulnerable, how that feels, and how that correlates to being honest with self and others. 4. Patient will identify situations where they could have told the truth, but instead lied and explain reasons of dishonesty.   Summary of Patient Progress The patient was able to explore the value of being honest.  Patient discussed thoughts, feelings, and behaviors related to honesty and trusting in others. The patient processed together with other group members how trust and honesty relate to forming relationships with peers, family members, and self. Pt actively and appropriately engaged in the group. Patient was able to provide support and validation to other group members. Patient practiced active listening when  interacting with the facilitator and other group members.    Therapeutic Modalities:   Cognitive Behavioral Therapy Solution Focused Therapy Motivational Interviewing Brief Therapy  Florentina Marquart  CUEBAS-COLON, LCSW 03/09/2019 12:49 PM

## 2019-03-09 NOTE — Plan of Care (Signed)
Patient alert and oriented. Patient voiced no major complaints to this Probation officer. Patient denies SI/HI/AVH and pain at this time. Patient also denies any signs/symptoms of depression/anxiety. Patient's goal for today is to take her medicine and attend groups. Took and tolerated scheduled medications administered to patient, without any adverse reactions noted. Support and encouragement provided.  Routine safety checks conducted every 15 minutes.  Patient informed to notify staff with problems or concerns.

## 2019-03-09 NOTE — Plan of Care (Signed)
Patient compliant with medication administration per MD orders  Problem: Education: Goal: Knowledge of the prescribed therapeutic regimen will improve Outcome: Progressing   

## 2019-03-10 NOTE — Plan of Care (Signed)
Patient is compliant with treatment plan, adequate po intake noted. Milieu remains safe.

## 2019-03-10 NOTE — Progress Notes (Signed)
D - Patient was outside upon arrival to the unit with the group. Patient was pleasant during assessment and medication administration. Patient denies SI/HI/AVH and pain with this Probation officer. Patient was unable to say if she had depression or anxiety.Patient stated this day was better than the last one. Patient said her brother is going to be able to pick her up on Monday.   A - Patient compliant with medication administration per MD orders and procedures on the unit. Patient given education. Patient given support and encouragement to be active in her treatment plan. Patient informed to let staff know if there are any issues or problems on the unit.   R - Patient being monitored Q 15 minutes for safety per unit protocol. Patient remains safe on the unit.

## 2019-03-10 NOTE — [facility] Group Notes (Signed)
LCSW Group Therapy Note 03/10/2019 1:15pm  Type of Therapy and Topic: Group Therapy: Feelings Around Returning Home & Establishing a Supportive Framework and Supporting Oneself When Supports Not Available  Participation Level: Active  Description of Group:  Patients first processed thoughts and feelings about upcoming discharge. These included fears of upcoming changes, lack of change, new living environments, judgements and expectations from others and overall stigma of mental health issues. The group then discussed the definition of a supportive framework, what that looks and feels like, and how do to discern it from an unhealthy non-supportive network. The group identified different types of supports as well as what to do when your family/friends are less than helpful or unavailable  Therapeutic Goals  1. Patient will identify one healthy supportive network that they can use at discharge. 2. Patient will identify one factor of a supportive framework and how to tell it from an unhealthy network. 3. Patient able to identify one coping skill to use when they do not have positive supports from others. 4. Patient will demonstrate ability to communicate their needs through discussion and/or role plays.  Summary of Patient Progress:  The patient reported she feels "horrible." Pt engaged during group session. As patients processed their anxiety about discharge and described healthy supports patient shared she is not ready to be discharge. She stated "I don't want to go home, if I go home I'm going to poison my nephews." Patient reports she does not have a support system.  Patients identified at least one self-care tool they were willing to use after discharge; puzzles.   Therapeutic Modalities Cognitive Behavioral Therapy Motivational Interviewing   Cheree Ditto, LCSW 03/10/2019 3:51 PM

## 2019-03-10 NOTE — Plan of Care (Signed)
Patient said that she has been sleeping better the last few nights.   Problem: Activity: Goal: Imbalance in normal sleep/wake cycle will improve Outcome: Progressing

## 2019-03-10 NOTE — Progress Notes (Signed)
[facility]  03/10/2019 9:00 AM Jenna AblesLinda Riggs  MRN:  [id number] Subjective:   Patient remains generally organized in thought and behavior, gives contradictory information, yesterday was determined not to be discharged, today requests to go home by Monday.  Alert and oriented to person place situation and day denies wanting to harm self today denies wanting to harm others contracting. Principal Problem: Adjustment disorder with mixed disturbance of emotions and conduct Diagnosis: Principal Problem:   Adjustment disorder with mixed disturbance of emotions and conduct Active Problems:   Moderate intellectual disability   Bipolar affective disorder, mixed, severe (HCC)  Total Time spent with patient: 20 minutes  Past Psychiatric History: exst  Past Medical History:  Past Medical History:  Diagnosis Date  . Mental retardation   . Renal disorder   . Seizures (HCC)     Past Surgical History:  Procedure Laterality Date  . ANKLE FUSION Right    Family History:  Family History  Problem Relation Age of Onset  . Diabetes Mother   . Diabetes Father    Family Psychiatric  History: no new info Social History:  Social History   Substance and Sexual Activity  Alcohol Use No     Social History   Substance and Sexual Activity  Drug Use No    Social History   Socioeconomic History  . Marital status: Single    Spouse name: Not on file  . Number of children: Not on file  . Years of education: Not on file  . Highest education level: Not on file  Occupational History  . Not on file  Social Needs  . Financial resource strain: Not very hard  . Food insecurity    Worry: Patient refused    Inability: Patient refused  . Transportation needs    Medical: Patient refused    Non-medical: Patient refused  Tobacco Use  . Smoking status: Current Every Day Smoker    Packs/day: 1.00    Years: 35.00    Pack years: 35.00    Types: Cigarettes  . Smokeless tobacco: Never Used  .  Tobacco comment: Patient refused  Substance and Sexual Activity  . Alcohol use: No  . Drug use: No  . Sexual activity: Never    Birth control/protection: None  Lifestyle  . Physical activity    Days per week: Patient refused    Minutes per session: Patient refused  . Stress: To some extent  Relationships  . Social Musicianconnections    Talks on phone: Patient refused    Gets together: Patient refused    Attends religious service: Patient refused    Active member of club or organization: Patient refused    Attends meetings of clubs or organizations: Patient refused    Relationship status: Patient refused  Other Topics Concern  . Not on file  Social History Narrative  . Not on file   Additional Social History:  Specify valuables returned: hair bow,chips and bottle water                      Sleep: Good  Appetite:  Good  Current Medications: Current Facility-Administered Medications  Medication Dose Route Frequency Provider Last Rate Last Dose  . acetaminophen (TYLENOL) tablet 650 mg  650 mg Oral Q6H PRN Charm RingsLord, Jamison Y, NP   650 mg at 03/04/19 [id number]  . alum & mag hydroxide-simeth (MAALOX/MYLANTA) 200-200-20 MG/5ML suspension 30 mL  30 mL Oral Q4H PRN Charm RingsLord, Jamison Y, NP      .  aspirin EC tablet 81 mg  81 mg Oral Daily Charm RingsLord, Jamison Y, NP   81 mg at 03/10/19 0827  . divalproex (DEPAKOTE) DR tablet 500 mg  500 mg Oral BID Charm RingsLord, Jamison Y, NP   500 mg at 03/10/19 0827  . lisinopril (ZESTRIL) tablet 10 mg  10 mg Oral Daily Charm RingsLord, Jamison Y, NP   10 mg at 03/10/19 0827  . magnesium hydroxide (MILK OF MAGNESIA) suspension 30 mL  30 mL Oral Daily PRN Charm RingsLord, Jamison Y, NP      . menthol-cetylpyridinium (CEPACOL) lozenge 3 mg  1 lozenge Oral PRN Clapacs, John T, MD      . QUEtiapine (SEROQUEL) tablet 400 mg  400 mg Oral QHS Clapacs, John T, MD   400 mg at 03/09/19 2200  . traZODone (DESYREL) tablet 300 mg  300 mg Oral QHS Charm RingsLord, Jamison Y, NP   300 mg at 03/09/19 2200    Lab Results:   Results for orders placed or performed during the hospital encounter of 03/01/19 (from the past 48 hour(s))  Valproic acid level     Status: None   Collection Time: 03/08/19  9:01 AM  Result Value Ref Range   Valproic Acid Lvl 85 50.0 - 100.0 ug/mL    Comment: Performed at [facility], [address] [place], [place] [address]    Blood Alcohol level:  Lab Results  Component Value Date   [facility] 02/[facility]2019   ETH <10 11/12/2018    Metabolic Disorder Labs: Lab Results  Component Value Date   HGBA1C 5.3 09/19/2016   MPG 105 09/19/2016   Lab Results  Component Value Date   PROLACTIN 12.8 09/19/2016   Lab Results  Component Value Date   CHOL 144 09/19/2016   TRIG 123 09/19/2016   HDL 44 09/19/2016   CHOLHDL 3.3 09/19/2016   VLDL 25 09/19/2016   LDLCALC 75 09/19/2016   LDLCALC 76 02/14/2015    Physical Findings: AIMS: Facial and Oral Movements Muscles of Facial Expression: None, normal Lips and Perioral Area: None, normal Jaw: None, normal Tongue: None, normal,Extremity Movements Upper (arms, wrists, hands, fingers): None, normal Lower (legs, knees, ankles, toes): None, normal, Trunk Movements Neck, shoulders, hips: None, normal, Overall Severity Severity of abnormal movements (highest score from questions above): None, normal Incapacitation due to abnormal movements: None, normal Patient's awareness of abnormal movements (rate only patient's report): No Awareness, Dental Status Current problems with teeth and/or dentures?: No Does patient usually wear dentures?: No  CIWA:    COWS:     Musculoskeletal: Strength & Muscle Tone: within normal limits Gait & Station: normal Patient leans: N/A  Psychiatric Specialty Exam: Physical Exam  ROS  Blood pressure 123/79, pulse 78, temperature 98.1 F (36.7 C), temperature source Oral, resp. rate 15, height 5\' 3"  (1.6 m), weight 101.2 kg, SpO2 94 %.Body mass index is 39.5 kg/m.  General Appearance: Casual   Eye Contact:  Fair  Speech:  Normal Rate  Volume:  Decreased  Mood:  Anxious and Euthymic  Affect:  Congruent  Thought Process:  Linear and Descriptions of Associations: Loose  Orientation:  Full (Time, Place, and Person)  Thought Content:  Logical  Suicidal Thoughts:  No  Homicidal Thoughts:  No  Memory:  Immediate;   Fair  Judgement:  Fair  Insight:  Fair  Psychomotor Activity:  Normal  Concentration:  Concentration: Fair  Recall:  [employer]Fair  Fund of Knowledge:  Fair  Language:  Fair  Akathisia:  Negative  Handed:  Right  AIMS (if indicated):     Assets:  Communication Skills Desire for Improvement  ADL's: Generally intact minimal assistance  Cognition:  WNL  Sleep:  Number of Hours: 7.25     Treatment Plan Summary: Daily contact with patient to assess and evaluate symptoms and progress in treatment, Medication management and Plan No change in medications or precautions continue reality-based and cognitive-based therapies risks benefits side effects of medications reviewed once more  Kirstie Larsen, MD 03/10/2019, 9:00 AM

## 2019-03-10 NOTE — Progress Notes (Signed)
This Probation officer was informed that patient stated to [place] that if she had to be discharged tomorrow that she is going to poison her nephews.

## 2019-03-10 NOTE — Progress Notes (Signed)
Patient was walking back to her room and stated to this writer, "I'm supposed to be going home tomorrow, but I'm not ready to go".

## 2019-03-10 NOTE — Progress Notes (Signed)
Patient denies having thoughts wanting to harm herself, affect is flat but she brightens upon approach, she is  pleasant and cooperative this evening with all treatment plans. Patient reports that she slept good last night. Patient's  thoughts are organized and coherent, she appears less anxious and she is interacting with peers and staff appropriately. Patient was offered support and encouraged to attend therapeutic groups. Patient  is complaint with medication regimen. Patient is receptive to treatment and safety maintained on unit.

## 2019-03-10 NOTE — Plan of Care (Signed)
Reports that she is feeling better but not feeling comfortable going back home. Wants to be placed somewhere else

## 2019-03-11 ENCOUNTER — Other Ambulatory Visit: Payer: Self-pay | Admitting: Psychiatry

## 2019-03-11 MED ORDER — QUETIAPINE FUMARATE 400 MG PO TABS: 400.0000 mg | ORAL_TABLET | Freq: Every day | ORAL | 1 refills | Status: AC

## 2019-03-11 MED ORDER — ASPIRIN EC 81 MG PO TBEC: 81.0000 mg | DELAYED_RELEASE_TABLET | Freq: Every day | ORAL | 1 refills | Status: DC

## 2019-03-11 MED ORDER — LISINOPRIL 10 MG PO TABS: 10.0000 mg | ORAL_TABLET | Freq: Every day | ORAL | 1 refills | Status: DC

## 2019-03-11 MED ORDER — TRAZODONE HCL 100 MG PO TABS: 300.0000 mg | ORAL_TABLET | Freq: Every day | ORAL | 1 refills | Status: AC

## 2019-03-11 NOTE — Progress Notes (Signed)
Recreation Therapy Notes  INPATIENT RECREATION TR PLAN  Patient Details Name: Jenna Riggs MRN: [id number] DOB: [birth year] Today's Date: 03/11/2019  Rec Therapy Plan Is patient appropriate for Therapeutic Recreation?: Yes Treatment times per week: At least 3 Estimated Length of Stay: 5-7 days TR Treatment/Interventions: Group participation (Comment)  Discharge Criteria Pt will be discharged from therapy if:: Discharged Treatment plan/goals/alternatives discussed and agreed upon by:: Patient/family  Discharge Summary Short term goals set: Patient will engage in groups without prompting or encouragement from LRT x3 group sessions within 5 recreation therapy group sessions Short term goals met: Complete Progress toward goals comments: Groups attended Which groups?: Coping skills, Goal setting, Other (Comment)(Team work,Relaxation) Reason goals not met: N/A Therapeutic equipment acquired: N/A Reason patient discharged from therapy: Discharge from hospital Pt/family agrees with progress & goals achieved: Yes Date patient discharged from therapy: 03/11/19   Akon Reinoso 03/11/2019, 11:43 AM

## 2019-03-11 NOTE — Progress Notes (Signed)
  [facility] Adult Case Management Discharge Plan :  Will you be returning to the same living situation after discharge:  Yes,  pt is returning home.  At discharge, do you have transportation home?: Yes,  pt's brother will pick up Do you have the ability to pay for your medications: Yes,  [employer] Medicare  Release of information consent forms completed and in the chart;   Patient to Follow up at: Follow-up Information    [facility] [place] [facility] on 03/13/2019.   Why: Please follow up with [facility] on Wednesday, July 15th at 9:20am. Please take your hospital discharge paperwork with you to your appointment. Thank you. Contact information: [place] [address] [phone]           Next level of care provider has access to [place] and Suicide Prevention discussed: Yes,  SPE completed with the patient's brother.   Have you used any form of tobacco in the last 30 days? (Cigarettes, Smokeless Tobacco, Cigars, and/or Pipes): Yes  Has patient been referred to the Quitline?: Patient refused referral  Patient has been referred for addiction treatment: N/A  Delfin Edis, LCSW 03/11/2019, 9:15 AM

## 2019-03-11 NOTE — Progress Notes (Signed)
Recreation Therapy Notes   Date: 03/11/2019  Time: 9:30 am   Location: Craft room   Behavioral response: N/A   Intervention Topic: Self-care  Discussion/Intervention: Patient did not attend group.   Clinical Observations/Feedback:  Patient did not attend group.   Burwell Bethel LRT/CTRS          Gabriela Giannelli 03/11/2019 10:41 AM

## 2019-03-11 NOTE — Discharge Summary (Signed)
Physician Discharge Summary Note  Patient:  Jarrett AblesLinda Cowman is an 58 y.o., female MRN:  [id number] DOB:  1961/04/03 Patient phone:  [phone] (home)  Patient address:   [address] [place] [address],  Total Time spent with patient: 45 minutes  Date of Admission:  03/01/2019 Date of Discharge: March 11, 2019  Reason for Admission: Admitted through the emergency room where she presented with statements of homicidal and suicidal ideation related to things that have been going on at her home.  Complaints from her family that she had been more aggressive and agitated at home.  Principal Problem: Adjustment disorder with mixed disturbance of emotions and conduct Discharge Diagnoses: Principal Problem:   Adjustment disorder with mixed disturbance of emotions and conduct Active Problems:   Moderate intellectual disability   Bipolar affective disorder, mixed, severe (HCC)   Past Psychiatric History: History of longstanding moderate to mild intellectual disability also diagnosis of bipolar disorder.  Past Medical History:  Past Medical History:  Diagnosis Date  . Mental retardation   . Renal disorder   . Seizures (HCC)     Past Surgical History:  Procedure Laterality Date  . ANKLE FUSION Right    Family History:  Family History  Problem Relation Age of Onset  . Diabetes Mother   . Diabetes Father    Family Psychiatric  History: See previous Social History:  Social History   Substance and Sexual Activity  Alcohol Use No     Social History   Substance and Sexual Activity  Drug Use No    Social History   Socioeconomic History  . Marital status: Single    Spouse name: Not on file  . Number of children: Not on file  . Years of education: Not on file  . Highest education level: Not on file  Occupational History  . Not on file  Social Needs  . Financial resource strain: Not very hard  . Food insecurity    Worry: Patient refused    Inability: Patient refused   . Transportation needs    Medical: Patient refused    Non-medical: Patient refused  Tobacco Use  . Smoking status: Current Every Day Smoker    Packs/day: 1.00    Years: 35.00    Pack years: 35.00    Types: Cigarettes  . Smokeless tobacco: Never Used  . Tobacco comment: Patient refused  Substance and Sexual Activity  . Alcohol use: No  . Drug use: No  . Sexual activity: Never    Birth control/protection: None  Lifestyle  . Physical activity    Days per week: Patient refused    Minutes per session: Patient refused  . Stress: To some extent  Relationships  . Social Musicianconnections    Talks on phone: Patient refused    Gets together: Patient refused    Attends religious service: Patient refused    Active member of club or organization: Patient refused    Attends meetings of clubs or organizations: Patient refused    Relationship status: Patient refused  Other Topics Concern  . Not on file  Social History Narrative  . Not on file    Hospital Course: Patient admitted to the psychiatric unit.  15-minute checks employed.  Patient did not show any dangerous aggressive or violent or suicidal behavior on the unit.  When questioned directly about going back home to stay with her brother and sister-in-law she would sometimes make dramatic statements about how she was going to "poison" them but did  not actually display any realistic wish to do anyone any serious harm.  She was able to acknowledge that this was her frustration about being back at home where she believed that they did not treat her as well as she would like.  I spoke with her guardian on the phone.  It was acknowledged that the patient has essentially been like this all of her life and there is not a great deal of improvement in baseline.  We had considered discharging her prior to the weekend and had agreed with the guardians to leave it another couple days to be sure that she was calm.  At the time of discharge the patient will  still talk about how she wants to do something to her brother and sister-in-law but on counseling about this acknowledges that this would not be a good idea and says that she will try to do her best to stay calm and take care of herself at home.  She has been advised that there are no group homes available at this time.  Slight changes to medications nothing very dramatic.  Prescriptions provided at discharge.  Physical Findings: AIMS: Facial and Oral Movements Muscles of Facial Expression: None, normal Lips and Perioral Area: None, normal Jaw: None, normal Tongue: None, normal,Extremity Movements Upper (arms, wrists, hands, fingers): None, normal Lower (legs, knees, ankles, toes): None, normal, Trunk Movements Neck, shoulders, hips: None, normal, Overall Severity Severity of abnormal movements (highest score from questions above): None, normal Incapacitation due to abnormal movements: None, normal Patient's awareness of abnormal movements (rate only patient's report): No Awareness, Dental Status Current problems with teeth and/or dentures?: No Does patient usually wear dentures?: No  CIWA:    COWS:     Musculoskeletal: Strength & Muscle Tone: within normal limits Gait & Station: normal Patient leans: N/A  Psychiatric Specialty Exam: Physical Exam  Nursing note and vitals reviewed. Constitutional: She appears well-developed and well-nourished.  HENT:  Head: Normocephalic and atraumatic.  Eyes: Pupils are equal, round, and reactive to light. Conjunctivae are normal.  Neck: Normal range of motion.  Cardiovascular: Regular rhythm and normal heart sounds.  Respiratory: Effort normal. No respiratory distress.  GI: Soft.  Musculoskeletal: Normal range of motion.  Neurological: She is alert.  Skin: Skin is warm and dry.  Psychiatric: Judgment normal. Her affect is blunt. Her speech is delayed. She is slowed. Thought content is not paranoid. Cognition and memory are impaired. She  expresses no homicidal and no suicidal ideation.    Review of Systems  Constitutional: Negative.   HENT: Negative.   Eyes: Negative.   Respiratory: Negative.   Cardiovascular: Negative.   Gastrointestinal: Negative.   Musculoskeletal: Negative.   Skin: Negative.   Neurological: Negative.   Psychiatric/Behavioral: Negative.     Blood pressure 123/79, pulse 78, temperature 98.1 F (36.7 C), temperature source Oral, resp. rate 15, height 5\' 3"  (1.6 m), weight 101.2 kg, SpO2 94 %.Body mass index is 39.5 kg/m.  General Appearance: Casual  Eye Contact:  Fair  Speech:  Clear and Coherent  Volume:  Decreased  Mood:  Dysphoric  Affect:  Congruent  Thought Process:  Coherent  Orientation:  Full (Time, Place, and Person)  Thought Content:  Illogical and Tangential  Suicidal Thoughts:  No  Homicidal Thoughts:  No  Memory:  Immediate;   Fair Recent;   Poor Remote;   Poor  Judgement:  Impaired  Insight:  Shallow  Psychomotor Activity:  Decreased  Concentration:  Concentration: Poor  Recall:  Poor  Fund of Knowledge:  Poor  Language:  Poor  Akathisia:  No  Handed:  Right  AIMS (if indicated):     Assets:  Desire for Improvement Housing Physical Health Resilience Social Support  ADL's:  Impaired  Cognition:  Impaired,  Mild and Moderate  Sleep:  Number of Hours: 6.75     Have you used any form of tobacco in the last 30 days? (Cigarettes, Smokeless Tobacco, Cigars, and/or Pipes): Yes  Has this patient used any form of tobacco in the last 30 days? (Cigarettes, Smokeless Tobacco, Cigars, and/or Pipes) Yes, Yes, A prescription for an FDA-approved tobacco cessation medication was offered at discharge and the patient refused  Blood Alcohol level:  Lab Results  Component Value Date   [facility] <10 02/27/2019   ETH <10 16/05/9603    Metabolic Disorder Labs:  Lab Results  Component Value Date   HGBA1C 5.3 09/19/2016   MPG 105 09/19/2016   Lab Results  Component Value Date    PROLACTIN 12.8 09/19/2016   Lab Results  Component Value Date   CHOL 144 09/19/2016   TRIG 123 09/19/2016   HDL 44 09/19/2016   CHOLHDL 3.3 09/19/2016   VLDL 25 09/19/2016   LDLCALC 75 09/19/2016   LDLCALC 76 02/14/2015    See Psychiatric Specialty Exam and Suicide Risk Assessment completed by Attending Physician prior to discharge.  Discharge destination:  Home  Is patient on multiple antipsychotic therapies at discharge:  No   Has Patient had three or more failed trials of antipsychotic monotherapy by history:  No  Recommended Plan for Multiple Antipsychotic Therapies: NA  Discharge Instructions    Diet - low sodium heart healthy   Complete by: As directed    Increase activity slowly   Complete by: As directed       Follow-up Information    [facility] [place] [facility] up on 03/13/2019.   Why: Please follow up with [facility] on Wednesday, July 15th at 9:20am. Please take your hospital discharge paperwork with you to your appointment. Thank you. Contact information: [place] [address] [phone]           Follow-up recommendations:  Activity:  Activity as tolerated Diet:  Regular diet Other:  Follow-up with outpatient care as prescribed from [place] [facility].  Comments: Patient calm not aggressive not violent and understands the plan for discharge.  Prescriptions provided at discharge  Signed: Alethia Berthold, MD 03/11/2019, 9:14 AM

## 2019-03-11 NOTE — [facility] Suicide Risk Assessment (Signed)
[facility] Discharge Suicide Risk Assessment   Principal Problem: Adjustment disorder with mixed disturbance of emotions and conduct Discharge Diagnoses: Principal Problem:   Adjustment disorder with mixed disturbance of emotions and conduct Active Problems:   Moderate intellectual disability   Bipolar affective disorder, mixed, severe (HCC)   Total Time spent with patient: 45 minutes  Musculoskeletal: Strength & Muscle Tone: within normal limits Gait & Station: normal Patient leans: N/A  Psychiatric Specialty Exam: Review of Systems  Constitutional: Negative.   HENT: Negative.   Eyes: Negative.   Respiratory: Negative.   Cardiovascular: Negative.   Gastrointestinal: Negative.   Musculoskeletal: Negative.   Skin: Negative.   Neurological: Negative.   Psychiatric/Behavioral: Negative.     Blood pressure 123/79, pulse 78, temperature 98.1 F (36.7 C), temperature source Oral, resp. rate 15, height 5\' 3"  (1.6 m), weight 101.2 kg, SpO2 94 %.Body mass index is 39.5 kg/m.  General Appearance: Casual  Eye Contact::  Fair  Speech:  Slow409  Volume:  Decreased  Mood:  Dysphoric  Affect:  Congruent  Thought Process:  Coherent  Orientation:  Full (Time, Place, and Person)  Thought Content:  Illogical and Rumination  Suicidal Thoughts:  No  Homicidal Thoughts:  No  Memory:  Immediate;   Fair Recent;   Poor Remote;   Poor  Judgement:  Impaired  Insight:  Shallow  Psychomotor Activity:  Decreased  Concentration:  Fair  Recall:  [employer] of Knowledge:Poor  Language: Poor  Akathisia:  No  Handed:  Right  AIMS (if indicated):     Assets:  Desire for Improvement Housing Resilience Social Support  Sleep:  Number of Hours: 6.75  Cognition: Impaired,  Mild and Moderate  ADL's:  Impaired   Mental Status Per Nursing Assessment::   On Admission:  Suicidal ideation indicated by others, Thoughts of violence towards others  Demographic Factors:  Caucasian  Loss  Factors: Financial problems/change in socioeconomic status  Historical Factors: Impulsivity  Risk Reduction Factors:   Religious beliefs about death, Living with another person, especially a relative, Positive social support and Positive therapeutic relationship  Continued Clinical Symptoms:  Bipolar Disorder:   Mixed State  Cognitive Features That Contribute To Risk:  Loss of executive function    Suicide Risk:  Minimal: No identifiable suicidal ideation.  Patients presenting with no risk factors but with morbid ruminations; may be classified as minimal risk based on the severity of the depressive symptoms  Follow-up Information    [facility] [place] [facility] up on 03/13/2019.   Why: Please follow up with [facility] on Wednesday, July 15th at 9:20am. Please take your hospital discharge paperwork with you to your appointment. Thank you. Contact information: [place] [address] [phone]           Plan Of Care/Follow-up recommendations:  Activity:  Activity as tolerated Diet:  Regular diet Other:  Follow-up [place] [facility]  Alethia Berthold, MD 03/11/2019, 9:20 AM
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
## 2019-03-11 NOTE — Progress Notes (Signed)
Patient alert and oriented x 4. Ambulates unit with steady gait. Verbally denies SI/HI/AVH and pain. Patient discharged on above date and time. Verbalized understanding the discharge information provided to patient upon discharge. Patient departed unit with discharge paperwork, prescriptions and personal belongings. Picked up by her brother and her sister in law to go home. No distress noted.

## 2019-03-12 ENCOUNTER — Other Ambulatory Visit: Payer: Self-pay | Admitting: Psychiatry

## 2019-03-14 ENCOUNTER — Other Ambulatory Visit: Payer: Self-pay | Admitting: Psychiatry

## 2019-04-05 ENCOUNTER — Other Ambulatory Visit: Payer: Self-pay | Admitting: Psychiatry

## 2019-04-08 ENCOUNTER — Other Ambulatory Visit: Payer: Self-pay | Admitting: Psychiatry

## 2019-04-15 ENCOUNTER — Other Ambulatory Visit: Payer: Self-pay | Admitting: Psychiatry

## 2019-05-11 ENCOUNTER — Other Ambulatory Visit: Payer: Self-pay | Admitting: Psychiatry

## 2019-06-21 ENCOUNTER — Other Ambulatory Visit: Payer: Self-pay | Admitting: Family Medicine

## 2019-06-21 DIAGNOSIS — Z1231 Encounter for screening mammogram for malignant neoplasm of breast: Secondary | ICD-10-CM

## 2020-01-19 ENCOUNTER — Emergency Department
Admission: EM | Admit: 2020-01-19 | Discharge: 2020-01-30 | Disposition: A | Discharge status: Home / Self Care | Payer: Medicare (Managed Care) | Attending: Emergency Medicine | Admitting: Emergency Medicine

## 2020-01-19 ENCOUNTER — Other Ambulatory Visit: Payer: Self-pay

## 2020-01-19 DIAGNOSIS — Z20822 Contact with and (suspected) exposure to covid-19: Secondary | ICD-10-CM | POA: Insufficient documentation

## 2020-01-19 DIAGNOSIS — F319 Bipolar disorder, unspecified: Secondary | ICD-10-CM | POA: Insufficient documentation

## 2020-01-19 DIAGNOSIS — R456 Violent behavior: Secondary | ICD-10-CM | POA: Insufficient documentation

## 2020-01-19 DIAGNOSIS — Z9114 Patient's other noncompliance with medication regimen: Secondary | ICD-10-CM | POA: Insufficient documentation

## 2020-01-19 DIAGNOSIS — F17210 Nicotine dependence, cigarettes, uncomplicated: Secondary | ICD-10-CM | POA: Diagnosis not present

## 2020-01-19 DIAGNOSIS — Z79899 Other long term (current) drug therapy: Secondary | ICD-10-CM | POA: Insufficient documentation

## 2020-01-19 DIAGNOSIS — F71 Moderate intellectual disabilities: Secondary | ICD-10-CM

## 2020-01-19 DIAGNOSIS — R4689 Other symptoms and signs involving appearance and behavior: Secondary | ICD-10-CM

## 2020-01-19 DIAGNOSIS — Z7982 Long term (current) use of aspirin: Secondary | ICD-10-CM | POA: Diagnosis not present

## 2020-01-19 DIAGNOSIS — F3163 Bipolar disorder, current episode mixed, severe, without psychotic features: Secondary | ICD-10-CM

## 2020-01-19 LAB — COMPREHENSIVE METABOLIC PANEL
ALT: 15 U/L (ref 0–44)
AST: 20 U/L (ref 15–41)
Albumin: 4 g/dL (ref 3.5–5.0)
Alkaline Phosphatase: 58 U/L (ref 38–126)
Anion gap: 7 (ref 5–15)
BUN: 30 mg/dL — ABNORMAL HIGH (ref 6–20)
CO2: 25 mmol/L (ref 22–32)
Calcium: 10.2 mg/dL (ref 8.9–10.3)
Chloride: 107 mmol/L (ref 98–111)
Creatinine, Ser: 2.01 mg/dL — ABNORMAL HIGH (ref 0.44–1.00)
GFR calc Af Amer: 31 mL/min — ABNORMAL LOW (ref >60)
GFR calc non Af Amer: 27 mL/min — ABNORMAL LOW (ref >60)
Glucose, Bld: 94 mg/dL (ref 70–99)
Potassium: 5.1 mmol/L (ref 3.5–5.1)
Sodium: 139 mmol/L (ref 135–145)
Total Bilirubin: 0.7 mg/dL (ref 0.3–1.2)
Total Protein: 8 g/dL (ref 6.5–8.1)

## 2020-01-19 LAB — URINE DRUG SCREEN, QUALITATIVE (ARMC ONLY)
Amphetamines, Ur Screen: NOT DETECTED
Barbiturates, Ur Screen: NOT DETECTED
Benzodiazepine, Ur Scrn: NOT DETECTED
Cannabinoid 50 Ng, Ur ~~LOC~~: NOT DETECTED
Cocaine Metabolite,Ur ~~LOC~~: NOT DETECTED
MDMA (Ecstasy)Ur Screen: NOT DETECTED
Methadone Scn, Ur: NOT DETECTED
Opiate, Ur Screen: NOT DETECTED
Phencyclidine (PCP) Ur S: NOT DETECTED
Tricyclic, Ur Screen: NOT DETECTED

## 2020-01-19 LAB — CBC
HCT: 38.8 % (ref 36.0–46.0)
Hemoglobin: 12.9 g/dL (ref 12.0–15.0)
MCH: 31.5 pg (ref 26.0–34.0)
MCHC: 33.2 g/dL (ref 30.0–36.0)
MCV: 94.6 fL (ref 80.0–100.0)
Platelets: 186 10*3/uL (ref 150–400)
RBC: 4.1 MIL/uL (ref 3.87–5.11)
RDW: 13.6 % (ref 11.5–15.5)
WBC: 5.5 10*3/uL (ref 4.0–10.5)
nRBC: 0 % (ref 0.0–0.2)

## 2020-01-19 LAB — ETHANOL: Alcohol, Ethyl (B): <10 mg/dL (ref <10)

## 2020-01-19 LAB — ACETAMINOPHEN LEVEL: Acetaminophen (Tylenol), Serum: <10 ug/mL — ABNORMAL LOW (ref 10–30)

## 2020-01-19 LAB — SALICYLATE LEVEL: Salicylate Lvl: <7 mg/dL — ABNORMAL LOW (ref 7.0–30.0)
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
## 2020-01-19 NOTE — ED Notes (Signed)
Hourly rounding reveals patient awake in room. No complaints, stable, in no acute distress. Q15 minute rounds and monitoring via Rover and Officer to continue.  
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
## 2020-01-19 NOTE — ED Notes (Signed)
Pt was given a sandwich tray.  
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
## 2020-01-19 NOTE — ED Notes (Signed)
Pt reports that she just threw up a lot in the toilet.

## 2020-01-19 NOTE — ED Notes (Addendum)
Hourly rounding reveals patient awake in room. No complaints, stable, in no acute distress. Q15 minute rounds and monitoring via Rover and Officer to continue.  
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
## 2020-01-19 NOTE — ED Notes (Signed)
Spoke with Sherlene Shams from [facility], advised him that the patient has small lac above right eyebrow and swelling. No other areas of injury reported by patient.

## 2020-01-19 NOTE — ED Notes (Signed)
Pt given cup of water 
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
## 2020-01-19 NOTE — ED Notes (Signed)
Pt was given a cup of ice water with no straw or lid.
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
## 2020-01-20 DIAGNOSIS — F319 Bipolar disorder, unspecified: Secondary | ICD-10-CM | POA: Diagnosis not present

## 2020-01-20 LAB — SARS CORONAVIRUS 2 BY RT PCR (HOSPITAL ORDER, PERFORMED IN ~~LOC~~ HOSPITAL LAB): SARS Coronavirus 2: NEGATIVE

## 2020-01-20 MED ORDER — ACETAMINOPHEN 500 MG PO TABS
1000.0000 mg | ORAL_TABLET | Freq: Once | ORAL | Status: AC
  Administered 2020-01-20: 1000 mg via ORAL
  Filled 2020-01-20: qty 2
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
## 2020-01-20 NOTE — ED Notes (Signed)
Pt given lunch tray.

## 2020-01-20 NOTE — ED Notes (Addendum)
Pt c/o a HA at the time with a pain scale of 10 on a *0-10 scale*. MD notified, orders to follow

## 2020-01-20 NOTE — ED Notes (Addendum)
Hourly rounding reveals patient laying in bed with eyes closed in room. No complaints, stable, in no acute distress. Q15 minute rounds and monitoring via Psychologist, counselling to continue.

## 2020-01-20 NOTE — ED Notes (Addendum)
Hourly rounding reveals patient laying in bed, with eyes closed  in room. No complaints, stable, in no acute distress. Q15 minute rounds and monitoring via Psychologist, counselling to continue.

## 2020-01-20 NOTE — ED Notes (Signed)
Pt ambulated to bathroom 
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
## 2020-01-20 NOTE — ED Notes (Signed)
Pt denies SI/HI/AVH on assessment 

## 2020-01-20 NOTE — ED Notes (Signed)
VOL  PENDING  PLACEMENT 

## 2020-01-20 NOTE — ED Notes (Signed)
Hourly rounding reveals patient in room, laying in bed with eyes closed. No complaints, stable, in no acute distress. Q15 minute rounds and monitoring via Rover and Officer to continue. 

## 2020-01-20 NOTE — ED Notes (Signed)
Patient given a sandwhich tray, juice and gingerale.

## 2020-01-20 NOTE — ED Notes (Signed)
Pt given breakfast tray

## 2020-01-20 NOTE — ED Provider Notes (Signed)
Emergency Medicine Observation Re-evaluation Note  Jenna Riggs is a 59 y.o. female, seen on rounds today.  Pt initially presented to the ED for complaints of Aggressive Behavior Currently, the patient is resting.  Physical Exam  BP (!) 161/76 (BP Location: Left Arm)   Pulse 64   Temp 98 F (36.7 C) (Oral)   Resp 16   Ht 5\' 6"  (1.676 m)   Wt 117.9 kg   SpO2 100%   BMI 41.97 kg/m  Physical Exam  ED Course / MDM  EKG:    I have reviewed the labs performed to date as well as medications administered while in observation.    Psychiatry endorsing daily contact with the patient to monitor treatment progress and medication management.  Plan  Current plan is for ongoing psychiatric follow-up and assessments here. Patient is not under full IVC at this time.   , MD 01/20/20 [phone]

## 2020-01-20 NOTE — ED Notes (Signed)
Hourly rounding reveals patient in room, laying in bed with eyes closed. No complaints, stable, in no acute distress. Q15 minute rounds and monitoring via Psychologist, counselling to continue.

## 2020-01-20 NOTE — ED Notes (Signed)
VOL/PENDING DISPO 

## 2020-01-20 NOTE — ED Notes (Addendum)
Hourly rounding reveals patient asleep in room. No complaints, stable, in no acute distress. Q15 minute rounds and monitoring via Rover and Officer to continue.  

## 2020-01-20 NOTE — ED Notes (Signed)
Pt has taken a shower.  

## 2020-01-20 NOTE — ED Notes (Signed)
Hourly rounding reveals patient sleeping in room. No complaints, stable, in no acute distress. Q15 minute rounds and monitoring via Rover and Officer to continue.  

## 2020-01-21 DIAGNOSIS — F319 Bipolar disorder, unspecified: Secondary | ICD-10-CM | POA: Diagnosis not present

## 2020-01-21 MED ORDER — ACETAMINOPHEN 325 MG PO TABS
650.0000 mg | ORAL_TABLET | Freq: Four times a day (QID) | ORAL | Status: DC | PRN
  Administered 2020-01-21 – 2020-01-27 (×4): 650 mg via ORAL
  Filled 2020-01-21 (×3): qty 2

## 2020-01-21 MED ORDER — TRAZODONE HCL 100 MG PO TABS
300.0000 mg | ORAL_TABLET | Freq: Every day | ORAL | Status: DC
  Administered 2020-01-21 – 2020-01-29 (×9): 300 mg via ORAL
  Filled 2020-01-21 (×9): qty 3

## 2020-01-21 MED ORDER — DIPHENHYDRAMINE HCL 25 MG PO CAPS
50.0000 mg | ORAL_CAPSULE | Freq: Four times a day (QID) | ORAL | Status: DC | PRN
  Administered 2020-01-21: 50 mg via ORAL

## 2020-01-21 MED ORDER — ACETAMINOPHEN 325 MG PO TABS
ORAL_TABLET | ORAL | Status: AC
  Filled 2020-01-21: qty 2

## 2020-01-21 MED ORDER — DIPHENHYDRAMINE HCL 25 MG PO CAPS
ORAL_CAPSULE | ORAL | Status: AC
  Administered 2020-01-21: 50 mg
  Filled 2020-01-21: qty 2

## 2020-01-21 MED ORDER — QUETIAPINE FUMARATE 200 MG PO TABS
400.0000 mg | ORAL_TABLET | Freq: Every day | ORAL | Status: DC
  Administered 2020-01-21 – 2020-01-29 (×9): 400 mg via ORAL
  Filled 2020-01-21 (×9): qty 2

## 2020-01-21 MED ORDER — LISINOPRIL 5 MG PO TABS
10.0000 mg | ORAL_TABLET | Freq: Every day | ORAL | Status: DC
  Administered 2020-01-22 – 2020-01-29 (×8): 10 mg via ORAL
  Filled 2020-01-21 (×10): qty 2

## 2020-01-21 MED ORDER — ASPIRIN EC 81 MG PO TBEC
81.0000 mg | DELAYED_RELEASE_TABLET | Freq: Every day | ORAL | Status: DC
  Administered 2020-01-22 – 2020-01-30 (×9): 81 mg via ORAL
  Filled 2020-01-21 (×10): qty 1

## 2020-01-21 MED ORDER — DIVALPROEX SODIUM 500 MG PO DR TAB
500.0000 mg | DELAYED_RELEASE_TABLET | Freq: Two times a day (BID) | ORAL | Status: DC
  Administered 2020-01-21 – 2020-01-30 (×17): 500 mg via ORAL
  Filled 2020-01-21 (×18): qty 1

## 2020-01-21 NOTE — ED Notes (Signed)
Introduced myself to patient.  Sandwich tray, icecream and drink provided.  Pt confortable at this time and will let us know if she needs anything

## 2020-01-21 NOTE — ED Notes (Signed)
Hourly rounding reveals patient in room. No complaints, stable, in no acute distress. Q15 minute rounds and monitoring via Security Cameras to continue. 

## 2020-01-21 NOTE — ED Notes (Signed)
Pt sitting in the common area speaking with other patients. Pt is calm and cooperative. Pt has been given a snack and a drink. Pt asking this RN for her regular meds to be ordered and that she uses [facility] but does not know which one.

## 2020-01-21 NOTE — ED Notes (Signed)
Pt's sister Latiffany Harwick, called wanting update. Advised pt still awaiting placement. Informed her that she will be notified once a place is identified and before pt is moved there.
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
## 2020-01-21 NOTE — ED Notes (Signed)
Report to include Situation, Background, Assessment, and Recommendations received from RN. Patient alert and oriented, warm and dry, in no acute distress. Patient denies SI, HI, AVH and pain. Patient made aware of Q15 minute rounds and Rover and Officer presence for their safety. Patient instructed to come to me with needs or concerns.   

## 2020-01-21 NOTE — ED Notes (Signed)
VOL  PENDING  PLACEMENT 

## 2020-01-21 NOTE — ED Provider Notes (Signed)
Emergency Medicine Observation Re-evaluation Note  Jenna Riggs is a 59 y.o. female, seen on rounds today.  Pt initially presented to the ED for complaints of Aggressive Behavior Currently, the patient is resting in no acute distress.  Physical Exam  BP 113/60 (BP Location: Left Arm)   Pulse 65   Temp 98 F (36.7 C) (Oral)   Resp 18   Ht 5\' 6"  (1.676 m)   Wt 117.9 kg   SpO2 96%   BMI 41.97 kg/m  Physical Exam  ED Course / MDM  EKG:    I have reviewed the labs performed to date as well as medications administered while in observation.  Recent changes in the last 24 hours include PRN Tylenol and Benadryl ordered for headache and pruritus. Plan  Current plan is for psychiatric disposition. Patient is not under full IVC at this time.   , MD 01/21/20 [phone]

## 2020-01-21 NOTE — ED Notes (Signed)
Pt st she woke up and felt like she was "itching all over". Pt also c/o a HA. EDP notified

## 2020-01-22 DIAGNOSIS — F319 Bipolar disorder, unspecified: Secondary | ICD-10-CM | POA: Diagnosis not present

## 2020-01-22 NOTE — ED Notes (Signed)
Pt given sandwich tray and ice popcicle for snack.

## 2020-01-22 NOTE — ED Notes (Signed)
Update provided to her legal guardian/sister [name]

## 2020-01-22 NOTE — TOC Progression Note (Signed)
Transition of Care [facility]) - Progression Note    Patient Details  Name: Jenna Riggs MRN: [id number] Date of Birth: 1961/05/16  [facility]) CM/SW Contact  [name] Phone Number: [phone] 01/22/2020, 3:41 PM  Clinical Narrative:     Sent FL2 to [facility], [place] [phone], fax [phone], possible visit tomorrow from group home rep, possible bed offer.  Expected Discharge Plan: Group Home Barriers to Discharge: Family Issues, ED Claims of Negligence on the Part of Facility/Family, ED Facility/Family Refusing to Allow Patient to Return  Expected Discharge Plan and Services Expected Discharge Plan: Group Home In-house Referral: Clinical Social Work   Post Acute Care Choice: NA Living arrangements for the past 2 months: Single Family Home                                       Social Determinants of Health (SDOH) Interventions    Readmission Risk Interventions No flowsheet data found.

## 2020-01-22 NOTE — ED Notes (Signed)
VOL  PENDING  PLACEMENT 

## 2020-01-22 NOTE — ED Provider Notes (Signed)
Emergency Medicine Observation Re-evaluation Note  Jenna Riggs is a 60 y.o. female, seen on rounds today.  Pt initially presented to the ED for complaints of Aggressive Behavior Currently, the patient is resting.  Physical Exam  BP (!) 169/88 (BP Location: Left Arm)   Pulse 77   Temp 98.3 F (36.8 C) (Oral)   Resp 16   Ht 1.676 m (5\' 6" )   Wt 117.9 kg   SpO2 100%   BMI 41.97 kg/m  Physical Exam  ED Course / MDM  EKG:    I have reviewed the labs performed to date as well as medications administered while in observation.  Recent changes in the last 24 hours include N/A. Plan  Current plan is for psychiatric disposition. Patient is not under full IVC at this time.   , MD 01/22/20 [phone]
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
## 2020-01-23 DIAGNOSIS — F319 Bipolar disorder, unspecified: Secondary | ICD-10-CM | POA: Diagnosis not present

## 2020-01-23 NOTE — ED Notes (Signed)
Pt. Received her dinner tray with a drink of apple juice.

## 2020-01-23 NOTE — TOC Progression Note (Signed)
Transition of Care [facility]) - Progression Note    Patient Details  Name: Jenna Riggs MRN: [id number] Date of Birth: [birth year]  Transition of Care [facility]) CM/SW Contact  Joseph Art, [place] Phone Number: 01/23/2020, 4:28 PM  Clinical Narrative:     CSW faxed FL2 to [facility] [phone], Attn: Amy ph: [phone], #2, for possible bed offer.  Expected Discharge Plan: Group Home Barriers to Discharge: Family Issues, ED Claims of Negligence on the Part of Facility/Family, ED Facility/Family Refusing to Allow Patient to Return  Expected Discharge Plan and Services Expected Discharge Plan: Group Home In-house Referral: Clinical Social Work   Post Acute Care Choice: NA Living arrangements for the past 2 months: Single Family Home                                       Social Determinants of Health (SDOH) Interventions    Readmission Risk Interventions No flowsheet data found.

## 2020-01-23 NOTE — ED Provider Notes (Signed)
Emergency Medicine Observation Re-evaluation Note  Jenna Riggs is a 59 y.o. female, seen on rounds today.  Pt initially presented to the ED for complaints of Aggressive Behavior Currently, the patient is NAD   Physical Exam  BP (!) 141/83 (BP Location: Right Arm)   Pulse 85   Temp 98.3 F (36.8 C) (Oral)   Resp 16   Ht 5\' 6"  (1.676 m)   Wt 117.9 kg   SpO2 97%   BMI 41.97 kg/m  Physical Exam  ED Course / MDM  EKG:    I have reviewed the labs performed to date as well as medications administered while in observation. Plan  Current plan is for psych  Patient is not under full IVC at this time.   , MD 01/23/20 [phone]

## 2020-01-23 NOTE — ED Notes (Signed)
Pt given [place] sandwich tray and ginger ale with ice. No other needs voiced at this time; will continue to monitor Q15 minute rounds.

## 2020-01-23 NOTE — ED Notes (Signed)
VOLUNTARY, awaiting placement

## 2020-01-23 NOTE — ED Notes (Signed)
Pt refused vitals 

## 2020-01-24 DIAGNOSIS — F319 Bipolar disorder, unspecified: Secondary | ICD-10-CM | POA: Diagnosis not present

## 2020-01-24 NOTE — ED Notes (Signed)
Pt given breakfast food tray with ginger ale.

## 2020-01-24 NOTE — ED Notes (Signed)
Pt given supper tray.

## 2020-01-24 NOTE — ED Notes (Signed)
Patient laying in bed only awake during meals. Patient offered a shower, patient declined states she feels to tired to get up right now. Patient pleasant and soft spoke. Will continue to monitor.

## 2020-01-24 NOTE — ED Provider Notes (Signed)
Emergency Medicine Observation Re-evaluation Note  Jenna Riggs is a 59 y.o. female, seen on rounds today.  Pt initially presented to the ED for complaints of Aggressive Behavior Currently, the patient is resting.  Physical Exam  BP 126/85 (BP Location: Left Arm)   Pulse 96   Temp 99.1 F (37.3 C) (Oral)   Resp 16   Ht 1.676 m (5\' 6" )   Wt 117.9 kg   SpO2 99%   BMI 41.97 kg/m  Physical Exam  ED Course / MDM  EKG:    I have reviewed the labs performed to date as well as medications administered while in observation.  Recent changes in the last 24 hours include N/A. Plan  Current plan is for psych disposition.. Patient is not under full IVC at this time.   , MD 01/24/20 [phone]

## 2020-01-24 NOTE — ED Notes (Signed)
VOL  PENDING  PLACEMENT 

## 2020-01-24 NOTE — ED Notes (Signed)
Hourly rounding reveals patient sleeping in room. No complaints, stable, in no acute distress. Q15 minute rounds and monitoring via Security Cameras to continue. 

## 2020-01-24 NOTE — ED Notes (Signed)
Hourly rounding reveals patient awake in room. No complaints, stable, in no acute distress. Q15 minute rounds and monitoring via Security Cameras to continue. 

## 2020-01-24 NOTE — ED Notes (Signed)
Report to include Situation, Background, Assessment, and Recommendations received from Jeannette RN. Patient alert and oriented, warm and dry, in no acute distress. Patient denies SI, HI, AVH and pain. Patient made aware of Q15 minute rounds and security cameras for their safety. Patient instructed to come to me with needs or concerns.  

## 2020-01-24 NOTE — ED Notes (Addendum)
Pt was given a sandwich tray and a sprite with ice, no lid or straw.

## 2020-01-24 NOTE — ED Notes (Signed)
Pt given lunch tray.

## 2020-01-24 NOTE — ED Notes (Signed)
Sitting in day room  

## 2020-01-25 DIAGNOSIS — F319 Bipolar disorder, unspecified: Secondary | ICD-10-CM | POA: Diagnosis not present

## 2020-01-25 NOTE — ED Notes (Signed)
VOL/Pending Placement 

## 2020-01-25 NOTE — ED Notes (Signed)
Meal tray given 

## 2020-01-25 NOTE — ED Notes (Signed)
Hourly rounding reveals patient awake in room. No complaints, stable, in no acute distress. Q15 minute rounds and monitoring via Security Cameras to continue. 

## 2020-01-25 NOTE — ED Notes (Signed)
Hourly rounding reveals patient sleeping in room. No complaints, stable, in no acute distress. Q15 minute rounds and monitoring via Security Cameras to continue. 

## 2020-01-25 NOTE — ED Notes (Signed)
Pt given meal tray.

## 2020-01-25 NOTE — ED Provider Notes (Signed)
Emergency Medicine Observation Re-evaluation Note  Jenna Riggs is a 59 y.o. female, seen on rounds today.  Pt initially presented to the ED for complaints of Aggressive Behavior Currently, the patient is sleeping.  Physical Exam  BP 127/81 (BP Location: Right Arm)   Pulse 81   Temp 98.6 F (37 C) (Oral)   Resp 16   Ht 5\' 6"  (1.676 m)   Wt 117.9 kg   SpO2 100%   BMI 41.97 kg/m  Physical Exam  ED Course / MDM  EKG:    I have reviewed the labs performed to date as well as medications administered while in observation.  Recent changes in the last 24 hours include none. Plan  Current plan is for psych disposition. Patient is not under full IVC at this time.   , Don Perking, MD 01/25/20 [phone]

## 2020-01-26 DIAGNOSIS — F319 Bipolar disorder, unspecified: Secondary | ICD-10-CM | POA: Diagnosis not present

## 2020-01-26 NOTE — ED Notes (Signed)
Hourly rounding reveals patient in room, with eyes closed. No complaints, stable, in no acute distress. Q15 minute rounds and monitoring via Security Cameras to continue.  

## 2020-01-26 NOTE — ED Notes (Addendum)
oob to bathroom shower offered. patient denied. snack and drink offered.

## 2020-01-26 NOTE — ED Notes (Signed)
Patient oob to day room requesting to shower. Oral hygiene and clean clothes provided for shower.

## 2020-01-26 NOTE — ED Notes (Signed)
Report to include Situation, Background, Assessment, and Recommendations received from Jeannette RN. Patient alert and oriented, warm and dry, in no acute distress. Patient denies SI, HI, AVH and pain. Patient made aware of Q15 minute rounds and security cameras for their safety. Patient instructed to come to me with needs or concerns.  

## 2020-01-26 NOTE — ED Notes (Signed)
Hourly rounding reveals patient in room. No complaints, stable, in no acute distress. Q15 minute rounds and monitoring via Security Cameras to continue. 

## 2020-01-26 NOTE — ED Notes (Signed)
Patient out of bed to bathroom.

## 2020-01-26 NOTE — ED Notes (Signed)
Pt given lunch meal tray with ginger ale.

## 2020-01-26 NOTE — ED Provider Notes (Signed)
Emergency Medicine Observation Re-evaluation Note  Jenna Riggs is a 59 y.o. female, seen on rounds today.  Pt initially presented to the ED for complaints of Aggressive Behavior Currently, the patient is resting in no acute distress.  Physical Exam  BP (!) 155/88 (BP Location: Right Arm)   Pulse 84   Temp 97.8 F (36.6 C) (Oral)   Resp 16   Ht 5\' 6"  (1.676 m)   Wt 117.9 kg   SpO2 99%   BMI 41.97 kg/m  Physical Exam  ED Course / MDM  EKG:    I have reviewed the labs performed to date as well as medications administered while in observation.  Recent changes in the last 24 hours include no events overnight. Plan  Current plan is for psychiatric disposition. Patient is not under full IVC at this time.   , MD 01/26/20 [phone]

## 2020-01-26 NOTE — ED Notes (Signed)
Pt given cup of diet ginger ale with ice

## 2020-01-26 NOTE — ED Notes (Addendum)
Hourly rounding reveals patient in room. No complaints, stable, in no acute distress. Q15 minute rounds and monitoring via Security Cameras to continue. 

## 2020-01-26 NOTE — ED Notes (Signed)
dinner provided

## 2020-01-27 DIAGNOSIS — F319 Bipolar disorder, unspecified: Secondary | ICD-10-CM | POA: Diagnosis not present

## 2020-01-27 NOTE — ED Notes (Signed)
Hourly rounding reveals patient in room. No complaints, stable, in no acute distress. Q15 minute rounds and monitoring via Security Cameras to continue. 

## 2020-01-27 NOTE — ED Notes (Signed)
Hourly rounding reveals patient sleeping in room. No complaints, stable, in no acute distress. Q15 minute rounds and monitoring via Security Cameras to continue. 

## 2020-01-27 NOTE — ED Provider Notes (Signed)
Emergency Medicine Observation Re-evaluation Note  Jenna Riggs is a 59 y.o. female, seen on rounds today.  Pt initially presented to the ED for complaints of Aggressive Behavior Currently, the patient is sleeping.  Physical Exam  BP (!) 110/58 (BP Location: Right Arm)   Pulse 73   Temp 98.1 F (36.7 C) (Oral)   Resp 16   Ht 5\' 6"  (1.676 m)   Wt 117.9 kg   SpO2 94%   BMI 41.97 kg/m  Physical Exam  ED Course / MDM  EKG:    I have reviewed the labs performed to date as well as medications administered while in observation.  Recent changes in the last 24 hours include none. Plan  Current plan is for psych dispo. Patient is not under full IVC at this time.   , Don Perking, MD 01/27/20 [phone]

## 2020-01-27 NOTE — ED Notes (Signed)
Water was given to patient at this time. 

## 2020-01-27 NOTE — ED Notes (Signed)
Pt given meal tray.

## 2020-01-27 NOTE — ED Notes (Signed)
Pt was provided with a snack and a drink. No other needs found at this moments.

## 2020-01-27 NOTE — ED Notes (Signed)
Pt awake and up to use the bathroom. Pt to door asking for something to drink. Pt given cup of water w/ ice

## 2020-01-28 ENCOUNTER — Emergency Department: Payer: Medicare (Managed Care)

## 2020-01-28 DIAGNOSIS — F319 Bipolar disorder, unspecified: Secondary | ICD-10-CM | POA: Diagnosis not present

## 2020-01-28 NOTE — ED Notes (Signed)
Pt. Was given drink

## 2020-01-28 NOTE — TOC Progression Note (Addendum)
Transition of Care [facility]) - Progression Note    Patient Details  Name: Jenna Riggs MRN: [id number] Date of Birth: [birth year]  Transition [facility] Care [facility]) CM/SW [place], RN Phone Number: 01/28/2020, 10:49 AM  Clinical Narrative:    Met with patient and Kelli Churn, Administrator for group home, [phone] regarding potential placement. Interview completed and patient accepted for placement. Anticipating discharge tomorrow morning pending TB and proof of COVID vaccine. Facility request chest xray and proof of COVID be faxed to [phone].    Expected Discharge Plan: Group Home Barriers to Discharge: Family Issues, ED Claims of Negligence on the Part of Facility/Family, ED Facility/Family Refusing to Allow Patient to Return  Expected Discharge Plan and Services Expected Discharge Plan: Group Home In-house Referral: Clinical Social Work   Post Acute Care Choice: NA Living arrangements for the past 2 months: Single Family Home                                       Social Determinants of Health (SDOH) Interventions    Readmission Risk Interventions No flowsheet data found.

## 2020-01-28 NOTE — ED Notes (Signed)
Pt given supplies to take a shower. Pt is able to shower independently.  

## 2020-01-28 NOTE — ED Provider Notes (Signed)
Emergency Medicine Observation Re-evaluation Note  Jenna Riggs is a 59 y.o. female, seen on rounds today.  Pt initially presented to the ED for complaints of Aggressive Behavior Currently, the patient is resting.  Physical Exam  BP (!) 148/74 (BP Location: Left Arm)   Pulse 92   Temp 97.7 F (36.5 C) (Oral)   Resp 18   Ht 1.676 m (5\' 6" )   Wt 117.9 kg   SpO2 100%   BMI 41.97 kg/m  Physical Exam  ED Course / MDM  EKG:    I have reviewed the labs performed to date as well as medications administered while in observation.  Recent changes in the last 24 hours include awaiting disposition plan. Plan  Current plan is for psych and/or social work disposition. Patient is not under full IVC at this time.   , MD 01/28/20 [phone]

## 2020-01-28 NOTE — ED Notes (Signed)
Pt escorted to to XR with this tech.

## 2020-01-28 NOTE — ED Notes (Signed)
VOL/Pending Placement 

## 2020-01-28 NOTE — ED Notes (Addendum)
Pt asleep, lunch tray placed on chair in rm. 

## 2020-01-28 NOTE — ED Notes (Signed)
Chest xray completed patient in bed resting.

## 2020-01-28 NOTE — ED Notes (Signed)
Pt asleep, breakfast tray placed on chair in rm.  

## 2020-01-28 NOTE — TOC Progression Note (Signed)
Transition of Care [facility]) - Progression Note    Patient Details  Name: Jenna Riggs MRN: [id number] Date of Birth: [birth year]  [facility]) CM/SW Contact  Riverton Cellar, RN Phone Number: 01/28/2020, 2:28 PM  Clinical Narrative:    Received call from [name], patients SIL/guardian, states she was having concerns regarding placement at [facility] as she was anticipating a larger facility that could offer the patient more activities. States she spoke with [facility] @ [address] who states they never received Fl2 from [facility] RN CM confirmed correct fax number and offered to resend fl2. Family very appreciative and understands patient has placement scheduled for in the morning and agree that if [facility] is not able to accept patient she will be discharging to [facility]   RN CM faxed Fl2 to [facility] as requested @ [phone] Attn: Leavy Cella  RN CM faxed TB chest xray results to [facility] as requested @ [phone].    Expected Discharge Plan: Group Home Barriers to Discharge: Family Issues, ED Claims of Negligence on the Part of Facility/Family, ED Facility/Family Refusing to Allow Patient to Return  Expected Discharge Plan and Services Expected Discharge Plan: Group Home In-house Referral: Clinical Social Work   Post Acute Care Choice: NA Living arrangements for the past 2 months: Single Family Home                                       Social Determinants of Health (SDOH) Interventions    Readmission Risk Interventions No flowsheet data found.

## 2020-01-28 NOTE — TOC Progression Note (Signed)
Transition of Care [facility]) - Progression Note    Patient Details  Name: Janaya Broy MRN: [id number] Date of Birth: [birth year]  Transition of Care [facility]) CM/SW Contact   Cellar, RN Phone Number: 01/28/2020, 11:04 AM  Clinical Narrative:    Sherron Monday to [name], sister, confirmed patient received first COVID vaccine at [facility] and has appointment for second vaccine on 6/18. Sister states she will bring the COVID proof of vaccine to hospital today.   Expected Discharge Plan: Group Home Barriers to Discharge: Family Issues, ED Claims of Negligence on the Part of Facility/Family, ED Facility/Family Refusing to Allow Patient to Return  Expected Discharge Plan and Services Expected Discharge Plan: Group Home In-house Referral: Clinical Social Work   Post Acute Care Choice: NA Living arrangements for the past 2 months: Single Family Home                                       Social Determinants of Health (SDOH) Interventions    Readmission Risk Interventions No flowsheet data found.

## 2020-01-28 NOTE — ED Notes (Signed)
Lunch provided.

## 2020-01-28 NOTE — ED Notes (Addendum)
Patient awakens to name. vss this morning. Denies SI/HI/HV. Awaiting placement. Will encourage bath today. Otherwise calm and cooperative. Safety maintained.

## 2020-01-28 NOTE — ED Notes (Signed)
VOL  PENDING  PLACEMENT 

## 2020-01-28 NOTE — ED Notes (Signed)
Pt given meal tray.

## 2020-01-29 DIAGNOSIS — F319 Bipolar disorder, unspecified: Secondary | ICD-10-CM | POA: Diagnosis not present

## 2020-01-29 LAB — SARS CORONAVIRUS 2 BY RT PCR (HOSPITAL ORDER, PERFORMED IN ~~LOC~~ HOSPITAL LAB): SARS Coronavirus 2: NEGATIVE

## 2020-01-29 NOTE — ED Provider Notes (Signed)
Emergency Medicine Observation Re-evaluation Note  Jenna Riggs is a 59 y.o. female, seen on rounds today.  Pt initially presented to the ED for complaints of Aggressive Behavior Currently, the patient is sleeping.  Physical Exam  BP 118/86 (BP Location: Left Arm)   Pulse 85   Temp 98.3 F (36.8 C) (Oral)   Resp 20   Ht 1.676 m (5\' 6" )   Wt 117.9 kg   SpO2 98%   BMI 41.97 kg/m  Physical Exam  ED Course / MDM  EKG:    I have reviewed the labs performed to date as well as medications administered while in observation.  Recent changes in the last 24 hours include nothing clinically relevant. Plan  Current plan is for psychiatry or social work disposition. Patient is not under full IVC at this time.   , MD 01/29/20 [phone]

## 2020-01-29 NOTE — ED Notes (Signed)
Water provided

## 2020-01-29 NOTE — TOC Progression Note (Signed)
Transition of Care [facility]) - Progression Note    Patient Details  Name: Jenna Riggs MRN: [id number] Date of Birth: 1961/06/27  Transition of Care [facility]) CM/SW Contact  Honaker Cellar, RN Phone Number: 01/29/2020, 10:18 AM  Clinical Narrative:    Call from Forrest Moron following up on potential discharge today. RN CM did not hear anything from [facility] yesterday and attempted to outreach to patients sister, [name] to confirm patient was discharging to [facility] No answer to sister and no option to leave voicemail. RN CM left message for [place] at [facility] to confirm patient had not been accepted to [facility]. Previous conversation with sister had confirmed patient would discharge to [facility] if not accepted to [facility]. Patient will discharge to [facility] as previously planned if no acceptance to [facility].    Expected Discharge Plan: Group Home Barriers to Discharge: Family Issues, ED Claims of Negligence on the Part of Facility/Family, ED Facility/Family Refusing to Allow Patient to Return  Expected Discharge Plan and Services Expected Discharge Plan: Group Home In-house Referral: Clinical Social Work   Post Acute Care Choice: NA Living arrangements for the past 2 months: Single Family Home                                       Social Determinants of Health (SDOH) Interventions    Readmission Risk Interventions No flowsheet data found.

## 2020-01-29 NOTE — ED Notes (Signed)
VOL/Pending Pickup in Am for [employer]

## 2020-01-29 NOTE — TOC Progression Note (Signed)
Transition of Care [facility]) - Progression Note    Patient Details  Name: [name] MRN: [id number] Date of Birth: 07/06/1961  Transition of Care [facility]) CM/SW Contact  [name] Phone Number: [phone] 01/29/2020, 12:07 PM  Clinical Narrative:     This CSW spoke with [name] [phone], patient's legal guardian, and she stated she would like the patient to go to [facility].  This CSW spoke with [facility] [phone], and he will transport the patient at 9:00AM.  The patient will need a rapid COVID test.  EDP/ED/RN updated.  Expected Discharge Plan: Group Home Barriers to Discharge: Family Issues, ED Claims of Negligence on the Part of Facility/Family, ED Facility/Family Refusing to Allow Patient to Return  Expected Discharge Plan and Services Expected Discharge Plan: Group Home In-house Referral: Clinical Social Work   Post Acute Care Choice: NA Living arrangements for the past 2 months: Single Family Home                                       Social Determinants of Health (SDOH) Interventions    Readmission Risk Interventions No flowsheet data found.

## 2020-01-30 DIAGNOSIS — F319 Bipolar disorder, unspecified: Secondary | ICD-10-CM | POA: Diagnosis not present

## 2020-01-30 NOTE — ED Notes (Signed)
Pt discharged to group home. VS stable. All belongings returned to patient.  Discharge instructions and negative COVID test sent with patient.

## 2020-01-30 NOTE — TOC Transition Note (Signed)
Transition of Care [facility]) - CM/SW Discharge Note   Patient Details  Name: Jenna Riggs MRN: [id number] Date of Birth: 11-14-1960  Transition of Care [facility]) CM/SW Contact:  [place] Cellar, RN Phone Number: 01/30/2020, 11:44 AM   Clinical Narrative:       Final next level of care: Group Home Barriers to Discharge: Family Issues, ED Claims of Negligence on the Part of Facility/Family, ED Facility/Family Refusing to Allow Patient to Return   Patient Goals and CMS Choice Patient states their goals for this hospitalization and ongoing recovery are:: "to go to a group home"      Discharge Placement  [facility] patient out with Forrest Moron group home administrator. NO other needs or concerns.                      Discharge Plan and Services In-house Referral: Clinical Social Work   Post Acute Care Choice: NA                               Social Determinants of Health (SDOH) Interventions     Readmission Risk Interventions No flowsheet data found.

## 2020-01-30 NOTE — TOC Progression Note (Signed)
Transition of Care [facility]) - Progression Note    Patient Details  Name: Jenna Riggs MRN: [id number] Date of Birth: 08-Aug-1961  Transition of Care [facility]) CM/SW Contact  Joseph Art, [place] Phone Number: 01/30/2020, 9:00 AM  Clinical Narrative:       Expected Discharge Plan: Group Home Barriers to Discharge: Family Issues, ED Claims of Negligence on the Part of Facility/Family, ED Facility/Family Refusing to Allow Patient to Return  Expected Discharge Plan and Services Expected Discharge Plan: Group Home In-house Referral: Clinical Social Work   Post Acute Care Choice: NA Living arrangements for the past 2 months: Single Family Home                                       Social Determinants of Health (SDOH) Interventions    Readmission Risk Interventions No flowsheet data found.

## 2020-01-30 NOTE — ED Notes (Signed)
Pt has taken a shower.  

## 2020-01-30 NOTE — TOC Transition Note (Signed)
Transition of Care [facility]) - CM/SW Discharge Note   Patient Details  Name: Jenna Riggs MRN: [id number] Date of Birth: May 14, 1961  Transition of Care [facility]) CM/SW Contact:  [name] Phone Number: [phone] 01/30/2020, 9:00 AM   Clinical Narrative:     Pt will d/c to [facility], [facility] [phone], pls include discharge summary, hard copies of new scripts, COVID test results. CSW informed EDP/EDRN.  Final next level of care: Group Home Barriers to Discharge: Family Issues, ED Claims of Negligence on the Part of Facility/Family, ED Facility/Family Refusing to Allow Patient to Return   Patient Goals and CMS Choice Patient states their goals for this hospitalization and ongoing recovery are:: "to go to a group home"      Discharge Placement                       Discharge Plan and Services In-house Referral: Clinical Social Work   Post Acute Care Choice: NA                               Social Determinants of Health (SDOH) Interventions     Readmission Risk Interventions No flowsheet data found.

## 2020-10-21 IMAGING — CR CHEST - 2 VIEW
2 series · 2 of 2 positions shown · non-contrast
Comparison: Chest x-ray dated September 25, 2017.

CLINICAL DATA: Facility placement.  Current smoker.

EXAM:
CHEST - 2 VIEW

[chest pa]
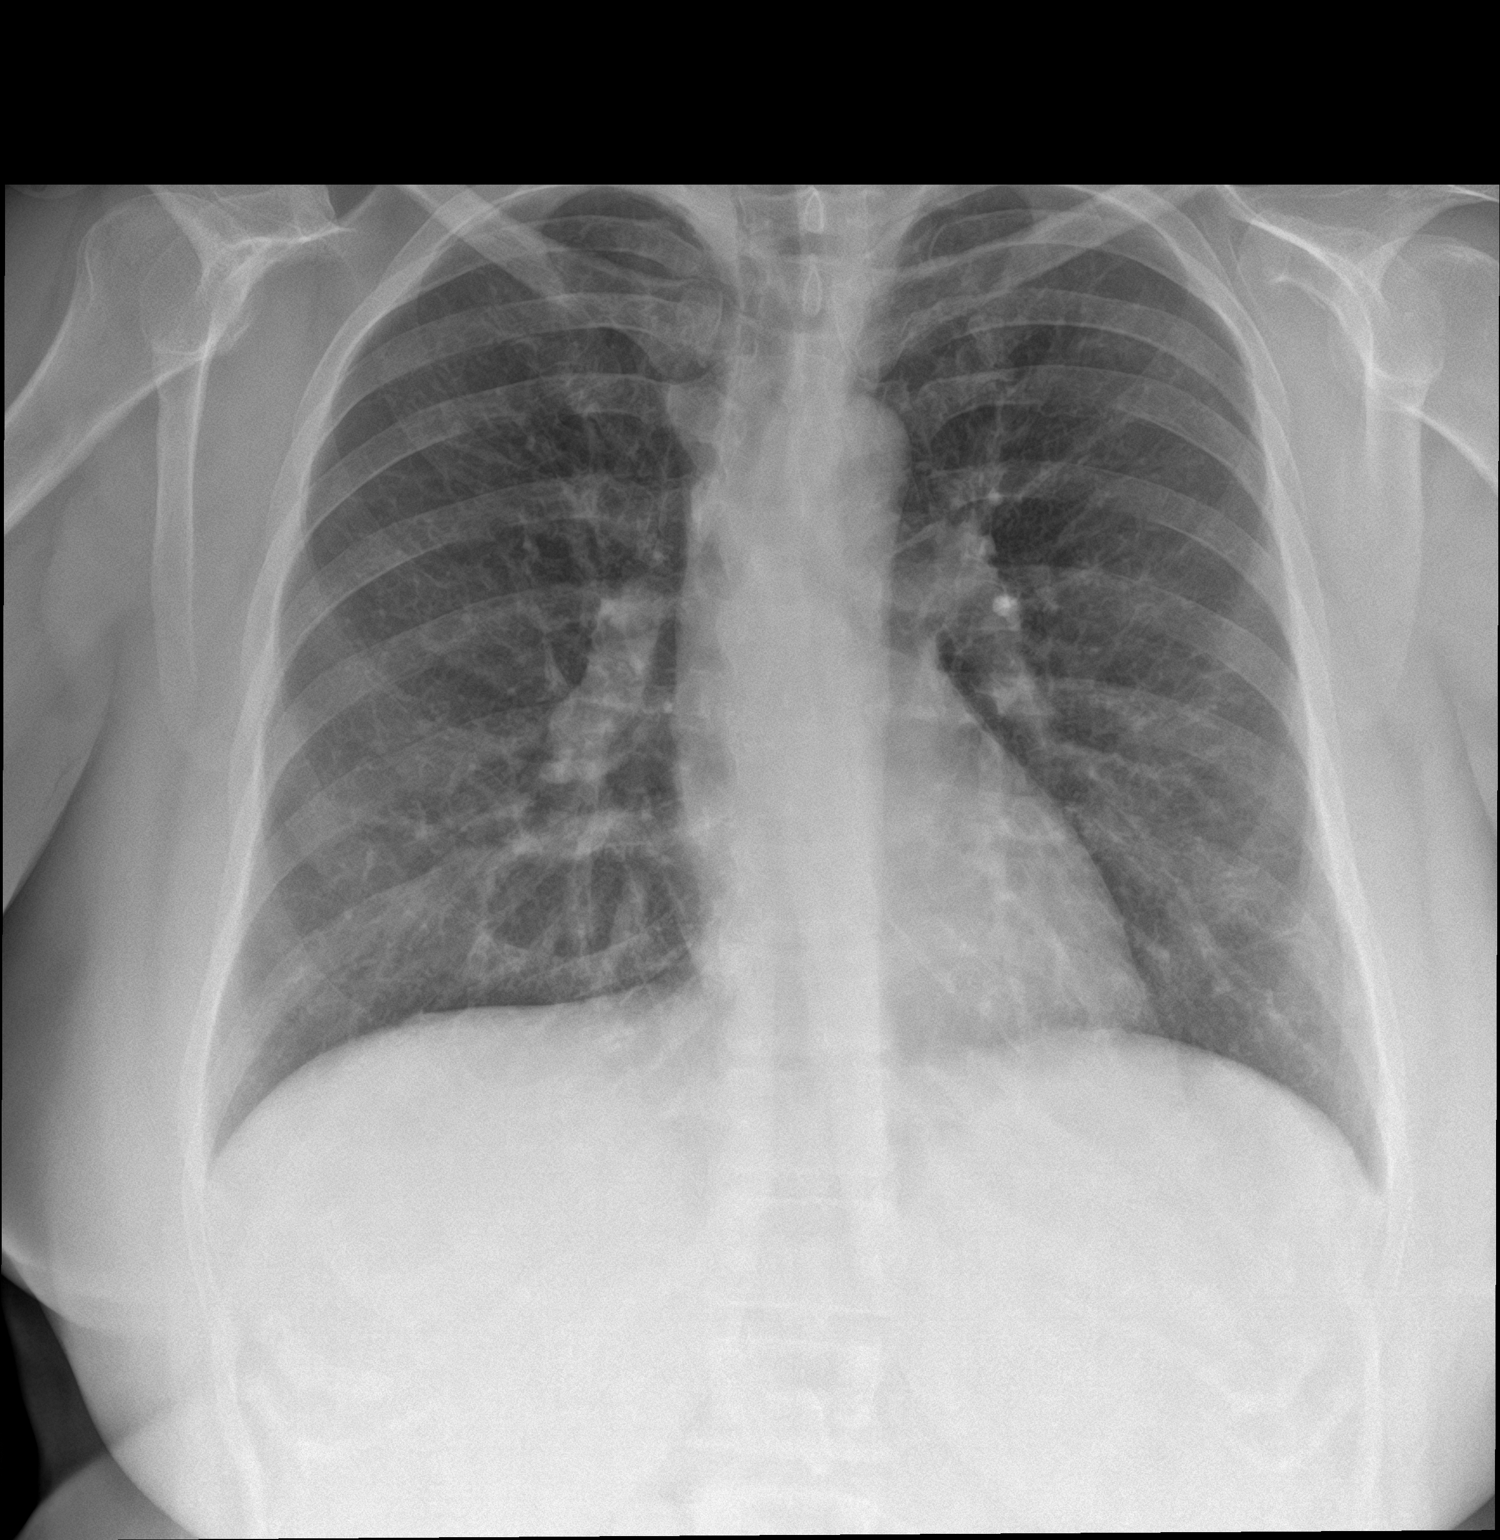

[chest lat]
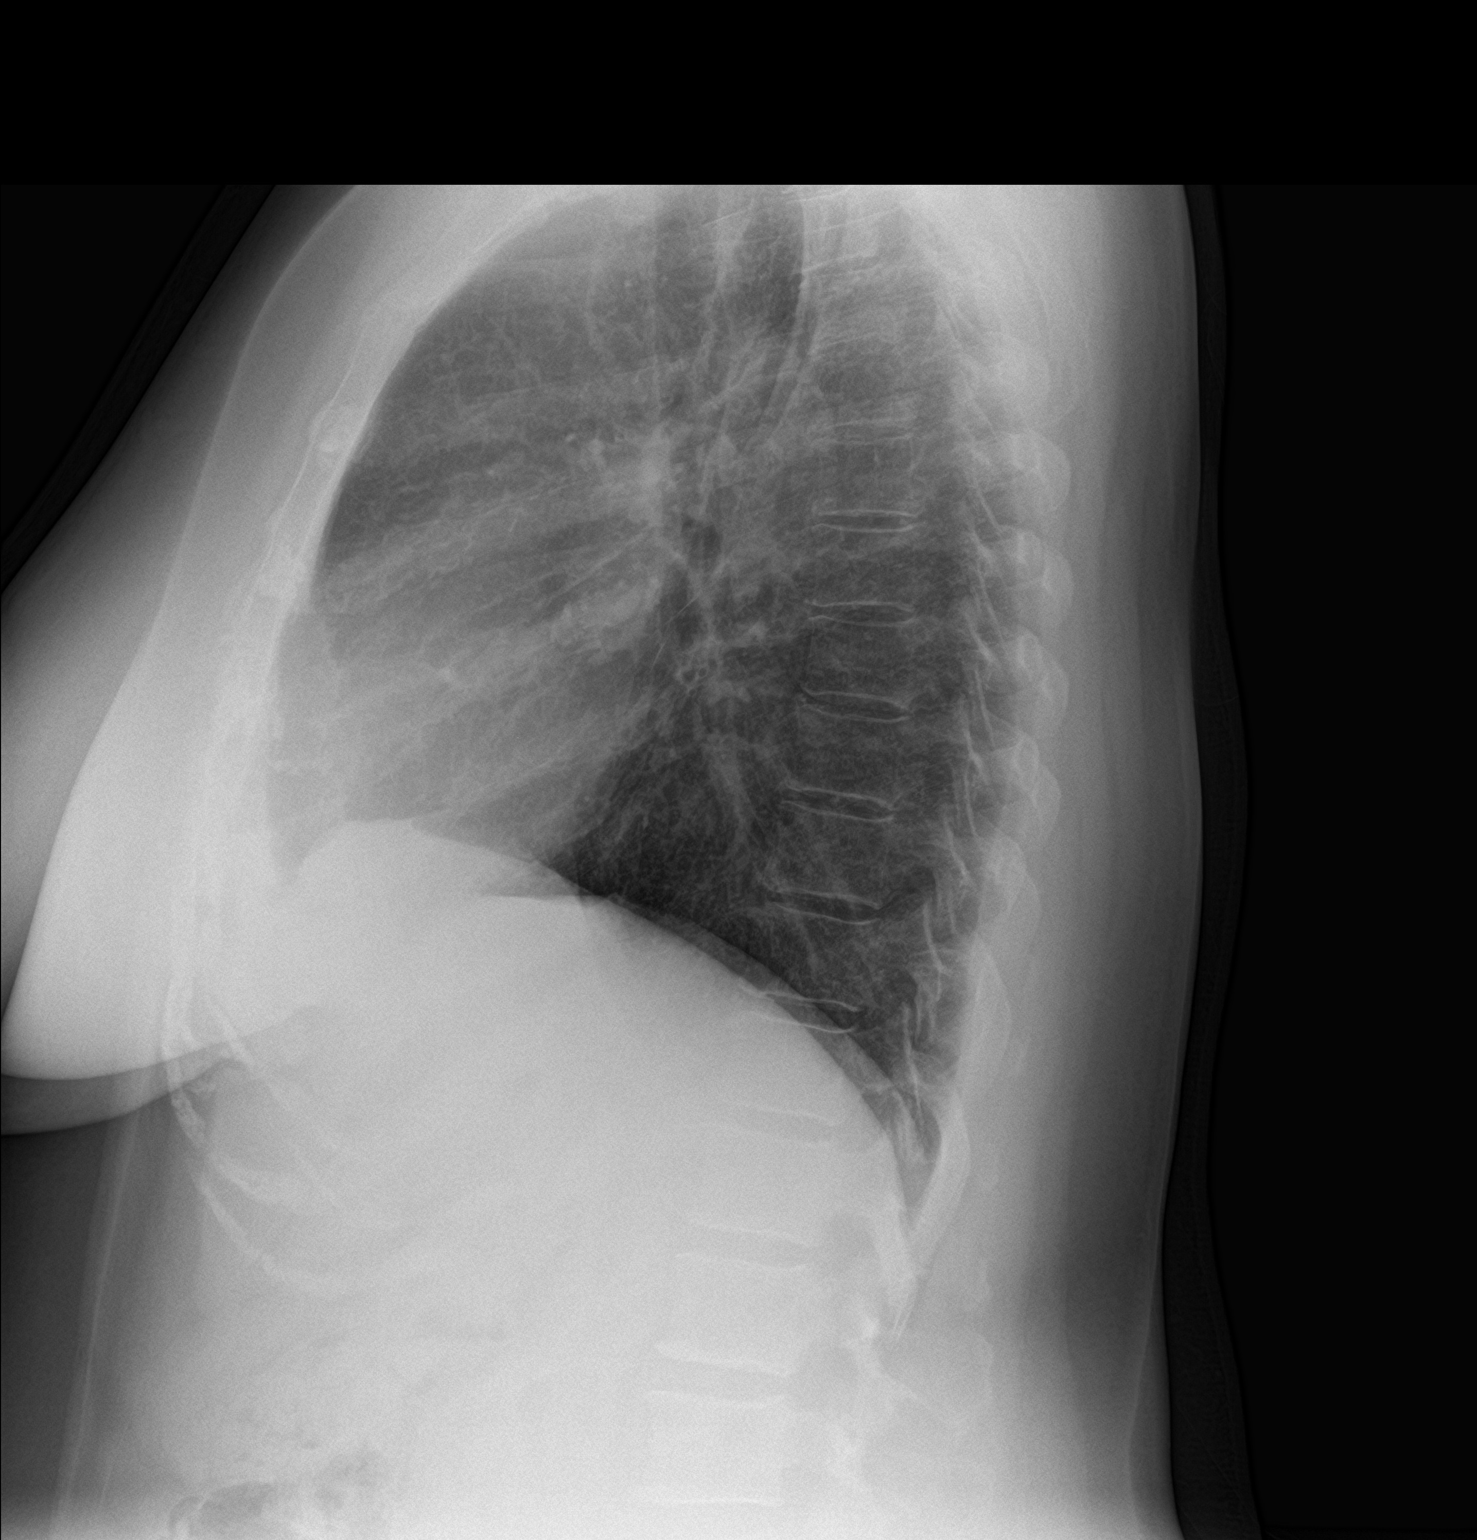

[2 of 2 positions shown; findings below may reference images not displayed]

FINDINGS: The heart size and mediastinal contours are within normal limits.
Mild peribronchial thickening, likely smoking-related. No focal
consolidation, pleural effusion, or pneumothorax. No acute osseous
abnormality.
IMPRESSION: 1.  No active cardiopulmonary disease.
2. Bronchitic changes, likely smoking-related.

## 2021-02-03 ENCOUNTER — Other Ambulatory Visit: Payer: Self-pay | Admitting: Family Medicine

## 2021-02-03 DIAGNOSIS — Z1231 Encounter for screening mammogram for malignant neoplasm of breast: Secondary | ICD-10-CM

## 2021-02-24 ENCOUNTER — Encounter (HOSPITAL_COMMUNITY): Payer: Self-pay

## 2021-02-24 ENCOUNTER — Other Ambulatory Visit: Payer: Self-pay

## 2021-02-24 ENCOUNTER — Emergency Department (HOSPITAL_COMMUNITY)
Admission: EM | Admit: 2021-02-24 | Discharge: 2021-02-25 | Disposition: A | Discharge status: Home / Self Care | Payer: Medicare (Managed Care) | Attending: Emergency Medicine | Admitting: Emergency Medicine

## 2021-02-24 DIAGNOSIS — F17210 Nicotine dependence, cigarettes, uncomplicated: Secondary | ICD-10-CM | POA: Diagnosis not present

## 2021-02-24 DIAGNOSIS — R55 Syncope and collapse: Secondary | ICD-10-CM

## 2021-02-24 DIAGNOSIS — I10 Essential (primary) hypertension: Secondary | ICD-10-CM | POA: Insufficient documentation

## 2021-02-24 DIAGNOSIS — R42 Dizziness and giddiness: Secondary | ICD-10-CM

## 2021-02-24 DIAGNOSIS — Z7982 Long term (current) use of aspirin: Secondary | ICD-10-CM | POA: Insufficient documentation

## 2021-02-24 DIAGNOSIS — I951 Orthostatic hypotension: Secondary | ICD-10-CM | POA: Insufficient documentation

## 2021-02-24 DIAGNOSIS — Z79899 Other long term (current) drug therapy: Secondary | ICD-10-CM | POA: Insufficient documentation

## 2021-02-24 LAB — COMPREHENSIVE METABOLIC PANEL
ALT: 8 U/L (ref 0–44)
AST: 10 U/L — ABNORMAL LOW (ref 15–41)
Albumin: 3.4 g/dL — ABNORMAL LOW (ref 3.5–5.0)
Alkaline Phosphatase: 48 U/L (ref 38–126)
Anion gap: 6 (ref 5–15)
BUN: 27 mg/dL — ABNORMAL HIGH (ref 6–20)
CO2: 28 mmol/L (ref 22–32)
Calcium: 9.6 mg/dL (ref 8.9–10.3)
Chloride: 106 mmol/L (ref 98–111)
Creatinine, Ser: 2.2 mg/dL — ABNORMAL HIGH (ref 0.44–1.00)
GFR, Estimated: 25 mL/min — ABNORMAL LOW (ref >60)
Glucose, Bld: 107 mg/dL — ABNORMAL HIGH (ref 70–99)
Potassium: 4.7 mmol/L (ref 3.5–5.1)
Sodium: 140 mmol/L (ref 135–145)
Total Bilirubin: 0.5 mg/dL (ref 0.3–1.2)
Total Protein: 6.9 g/dL (ref 6.5–8.1)

## 2021-02-24 LAB — CBC WITH DIFFERENTIAL/PLATELET
Abs Immature Granulocytes: 0.03 10*3/uL (ref 0.00–0.07)
Basophils Absolute: 0 10*3/uL (ref 0.0–0.1)
Basophils Relative: 1 %
Eosinophils Absolute: 0.3 10*3/uL (ref 0.0–0.5)
Eosinophils Relative: 4 %
HCT: 37.7 % (ref 36.0–46.0)
Hemoglobin: 12.2 g/dL (ref 12.0–15.0)
Immature Granulocytes: 1 %
Lymphocytes Relative: 26 %
Lymphs Abs: 1.7 10*3/uL (ref 0.7–4.0)
MCH: 33.9 pg (ref 26.0–34.0)
MCHC: 32.4 g/dL (ref 30.0–36.0)
MCV: 104.7 fL — ABNORMAL HIGH (ref 80.0–100.0)
Monocytes Absolute: 0.7 10*3/uL (ref 0.1–1.0)
Monocytes Relative: 10 %
Neutro Abs: 3.9 10*3/uL (ref 1.7–7.7)
Neutrophils Relative %: 58 %
Platelets: 149 10*3/uL — ABNORMAL LOW (ref 150–400)
RBC: 3.6 MIL/uL — ABNORMAL LOW (ref 3.87–5.11)
RDW: 12.9 % (ref 11.5–15.5)
WBC: 6.6 10*3/uL (ref 4.0–10.5)
nRBC: 0 % (ref 0.0–0.2)

## 2021-02-24 LAB — URINALYSIS, ROUTINE W REFLEX MICROSCOPIC
Bilirubin Urine: NEGATIVE
Glucose, UA: NEGATIVE mg/dL
Ketones, ur: NEGATIVE mg/dL
Nitrite: NEGATIVE
Protein, ur: NEGATIVE mg/dL
Specific Gravity, Urine: 1.012 (ref 1.005–1.030)
pH: 6 (ref 5.0–8.0)

## 2021-02-24 LAB — MAGNESIUM: Magnesium: 2 mg/dL (ref 1.7–2.4)

## 2021-02-24 LAB — TROPONIN I (HIGH SENSITIVITY)
Troponin I (High Sensitivity): 3 ng/L (ref <18)
Troponin I (High Sensitivity): 3 ng/L (ref <18)

## 2021-02-24 LAB — TSH: TSH: 2.966 u[IU]/mL (ref 0.350–4.500)

## 2021-02-24 MED ORDER — SODIUM CHLORIDE 0.9 % IV BOLUS
1000.0000 mL | Freq: Once | INTRAVENOUS | Status: AC
  Administered 2021-02-24: 1000 mL via INTRAVENOUS

## 2021-02-24 MED ORDER — SODIUM CHLORIDE 0.9 % IV BOLUS
500.0000 mL | Freq: Once | INTRAVENOUS | Status: AC
  Administered 2021-02-24: 500 mL via INTRAVENOUS

## 2021-02-24 NOTE — ED Notes (Signed)
Took food to patient. "Get those out of here, I don't want them."

## 2021-02-24 NOTE — Discharge Instructions (Addendum)
Your lightheadedness is likely due to your blood pressure dropping as you stand up, please make sure you are drinking more water daily to help with this, and make sure you get up slowly to help reduce dizziness and feeling like you are going to pass out.  Follow-up with your primary care doctor if the symptoms continue.

## 2021-02-24 NOTE — ED Provider Notes (Signed)
[facility] EMERGENCY DEPARTMENT Provider Note   CSN: [id number] Arrival date & time: 02/24/21  1121     History Chief Complaint  Patient presents with   Weakness    Jenna Riggs is a 60 y.o. female.  Jenna Riggs is a 60 y.o. female with hx of MR, seizures, CKD, hypertension, who presents to the emergency department from [facility] group home for evaluation of near syncopal episode.  Patient reports she has been feeling generally weak with intermittent episodes of lightheadedness and dizziness over the past few days.  Had an episode today where she became lightheaded and felt like she was going to pass out, did not fall to the floor and someone helped her sit down and symptoms improved.  The symptoms primarily are brought on when she gets up from sitting or laying, as was the case today.  Due to worsening symptoms today they sent her in to get this evaluated.  No associated chest pain or shortness of breath but does have some palpitations and a funny sensation in her chest sometimes.  No associated abdominal pain, nausea.  Denies any associated headache, vision changes, focal numbness or weakness.  Patient denies any recent bleeding issues.  No other aggravating or alleviating factors.  The history is provided by the patient.      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     Family History  Problem Relation Age of Onset   Diabetes Mother    Diabetes Father     Social History   Tobacco Use   Smoking status: Every Day    Packs/day: 1.00    Years: 35.00    Pack years: 35.00    Types: Cigarettes   Smokeless tobacco: Never   Tobacco comments:    Patient refused  Vaping Use   Vaping Use: Never used  Substance Use Topics   Alcohol use: No   Drug use: No    Home Medications Prior to Admission medications   Medication Sig Start Date End Date Taking? Authorizing Provider  aspirin EC 81 MG tablet Take 1 tablet (81 mg total) by mouth daily. 03/11/19  Yes Clapacs, Jackquline Denmark, MD  atorvastatin (LIPITOR) 10 MG tablet Take 10 mg by mouth daily. 01/28/21  Yes [provider]  divalproex (DEPAKOTE) 500 MG DR tablet TAKE 1 TABLET BY MOUTH TWICE DAILY Patient taking differently: Take 500 mg by mouth 2 (two) times daily. 04/16/19  Yes Clapacs, Jackquline Denmark, MD  ibuprofen (ADVIL) 800 MG tablet Take 800 mg by mouth 3 (three) times daily. 01/15/21  Yes [provider]  lisinopril (ZESTRIL) 10 MG tablet TAKE 1 TABLET BY MOUTH DAILY Patient taking differently:  Take 10 mg by mouth daily. 03/12/19  Yes Clapacs, Jackquline Denmark, MD  magnesium hydroxide (MILK OF MAGNESIA) 400 MG/5ML suspension Take 15 mLs by mouth 2 (two) times daily as needed for mild constipation.   Yes [provider]  QUEtiapine (SEROQUEL) 400 MG tablet Take 1 tablet (400 mg total) by mouth at bedtime. 03/11/19  Yes Clapacs, Jackquline Denmark, MD  traZODone (DESYREL) 100 MG tablet Take 3 tablets (300 mg total) by mouth at bedtime. 03/11/19  Yes Clapacs, Jackquline Denmark, MD    Allergies    Patient has no known allergies.  Review of Systems   Review of Systems  Constitutional:  Positive for fatigue. Negative for chills and fever.  Eyes:   Negative for visual disturbance.  Respiratory:  Negative for cough and shortness of breath.   Cardiovascular:  Positive for palpitations. Negative for chest pain.  Gastrointestinal:  Negative for abdominal pain, diarrhea, nausea and vomiting.  Genitourinary:  Negative for dysuria.  Musculoskeletal:  Negative for arthralgias and myalgias.  Neurological:  Positive for dizziness, weakness (Generalized) and light-headedness. Negative for syncope, facial asymmetry, speech difficulty, numbness and headaches.  All other systems reviewed and are negative.  Physical Exam Updated Vital Signs BP 134/79   Pulse 67   Temp 98.2 F (36.8 C) (Oral)   Resp 11   Ht 5\' 3"  (1.6 m)   Wt 99.8 kg   SpO2 98%   BMI 38.97 kg/m   Physical Exam Vitals and nursing note reviewed.  Constitutional:      General: She is not in acute distress.    Appearance: Normal appearance. She is well-developed. She is not diaphoretic.     Comments: MR at baseline  HENT:     Head: Normocephalic and atraumatic.  Eyes:     General:        Right eye: No discharge.        Left eye: No discharge.     Extraocular Movements: Extraocular movements intact.     Pupils: Pupils are equal, round, and reactive to light.  Cardiovascular:     Rate and Rhythm: Normal rate and regular rhythm.     Pulses: Normal pulses.     Heart sounds: Normal heart sounds. No murmur heard.   No friction rub. No gallop.  Pulmonary:     Effort: Pulmonary effort is normal. No respiratory distress.     Breath sounds: Normal breath sounds. No wheezing or rales.     Comments: Respirations equal and unlabored, patient able to speak in full sentences, lungs clear to auscultation bilaterally  Abdominal:     General: Bowel sounds are normal. There is no distension.     Palpations: Abdomen is soft. There is no mass.     Tenderness: There is no abdominal tenderness. There is no guarding.     Comments: Abdomen soft, nondistended, nontender to palpation in all  quadrants without guarding or peritoneal signs  Musculoskeletal:        General: No deformity.     Cervical back: Neck supple.  Skin:    General: Skin is warm and dry.     Capillary Refill: Capillary refill takes less than 2 seconds.  Neurological:     Mental Status: She is alert and oriented to person, place, and time.     Coordination: Coordination normal.     Comments: Speech is clear, able to follow commands CN III-XII intact Normal strength in upper and lower extremities bilaterally including dorsiflexion and plantar flexion, strong and  equal grip strength Sensation normal to light and sharp touch Moves extremities without ataxia, coordination intact    ED Results / Procedures / Treatments   Labs (all labs ordered are listed, but only abnormal results are displayed) Labs Reviewed  COMPREHENSIVE METABOLIC PANEL - Abnormal; Notable for the following components:      Result Value   Glucose, Bld 107 (*)    BUN 27 (*)    Creatinine, Ser 2.20 (*)    Albumin 3.4 (*)    AST 10 (*)    GFR, Estimated 25 (*)    All other components within normal limits  CBC WITH DIFFERENTIAL/PLATELET - Abnormal; Notable for the following components:   RBC 3.60 (*)    MCV 104.7 (*)    Platelets 149 (*)    All other components within normal limits  URINALYSIS, ROUTINE W REFLEX MICROSCOPIC - Abnormal; Notable for the following components:   APPearance HAZY (*)    Hgb urine dipstick SMALL (*)    Leukocytes,Ua MODERATE (*)    Bacteria, UA RARE (*)    All other components within normal limits  MAGNESIUM  TSH  TROPONIN I (HIGH SENSITIVITY)  TROPONIN I (HIGH SENSITIVITY)    EKG EKG Interpretation  Date/Time:  Wednesday February 24 2021 11:36:39 EDT Ventricular Rate:  70 PR Interval:  161 QRS Duration: 85 QT Interval:  390 QTC Calculation: 421 R Axis:   0 Text Interpretation: Sinus rhythm RSR' in V1 or V2, right VCD or RVH Confirmed by Alvester Chou [phone]) on 02/24/2021 3:02:19  PM  Radiology No results found.  Procedures Procedures   Medications Ordered in ED Medications  sodium chloride 0.9 % bolus 1,000 mL (0 mLs Intravenous Stopped 02/24/21 1723)  sodium chloride 0.9 % bolus 500 mL (500 mLs Intravenous New Bag/Given 02/24/21 1723)    ED Course  I have reviewed the triage vital signs and the nursing notes.  Pertinent labs & imaging results that were available during my care of the patient were reviewed by me and considered in my medical decision making (see chart for details).    MDM Rules/Calculators/A&P                         60 y.o. female presents to the ED with complaints of lightheadedness, this involves an extensive number of treatment options, and is a complaint that carries with it a high risk of complications and morbidity.  The differential diagnosis includes hypovolemia, anemia, orthostatic hypotension, arrhythmia, ACS, PE, electrolyte derangements, infection   On arrival pt is nontoxic, vitals WNL. Exam overall reassuring with no obvious abnormalities  Additional history obtained from chart review. Previous records obtained and reviewed via EMR  Patient's orthostatics were positive with 30 point drop in systolic blood pressure upon standing.  We will give IV fluids and reevaluate  Lab Tests:  I Ordered, reviewed, and interpreted labs, which included:  CBC: No leukocytosis, normal hemoglobin CMP: Creatinine slightly increased from baseline at 2.2, no other significant electrolyte derangements, normal liver function Magnesium: WNL TSH: WNL Troponin: Negative x2  EKG: Normal sinus rhythm   ED Course:   After IV fluids orthostatics have improved and patient is feeling much better while up walking, denies lightheadedness.  Encouraged her to drink more fluids and to get up slowly.  Feel this is likely the cause of her symptoms and at this point feel she is stable for discharge back to group home, no further emergent work-up indicated.  Return precautions provided.  I also called and spoke with patient's brother who is her legal guardian regarding care and he is in agreement with this plan.  Nursing staff to arrange transport back to group home.  Portions of this note were generated with Scientist, clinical (histocompatibility and immunogenetics)Dragon dictation software. Dictation errors may occur despite best attempts at proofreading.   Final Clinical Impression(s) / ED Diagnoses Final diagnoses:  Near syncope  Orthostatic lightheadedness    Rx / DC Orders ED Discharge Orders     None        Legrand RamsFord, Samer Dutton N, PA-C 02/24/21 2155    Terald Sleeperrifan, Matthew J, MD 02/25/21 1009

## 2021-02-24 NOTE — ED Notes (Signed)
Pt in bed, pt moving all extremities, equal sensation bilaterally, cardiac monitor in place, pt nsr on monitor, pt awaits md eval

## 2021-02-24 NOTE — ED Notes (Signed)
RESPIRATION RATE 16.  PATIENT IS ASLEEP AND BREATHING SHALLOW - MONITOR HAVING DIFFICULT TIME MEASURING RESPIRATION RATE.

## 2021-02-24 NOTE — ED Triage Notes (Signed)
Pt to er, pt states that she broke her foot a long time ago, pt has walking boot in place on L foot. Pt states that she is here for weakness and dizziness when standing, states that it has been going on for awhile, but today it was worse, pt states that she stays at [facility] group home and they wanted her to get checked out.  Pt denies pain, resps even and unlabored, pt oriented to person, day of the week, doesn't know place, re oriented pt.

## 2021-02-24 NOTE — ED Notes (Signed)
Orthostatic vital signs complete, pt denies dizziness, pt ambulatory to bathroom with unsteady gait.

## 2021-02-24 NOTE — ED Notes (Signed)
Pt offered food at this time.

## 2021-02-25 NOTE — ED Notes (Signed)
Per [facility]sw pt will take safe transport home via [facility], reviewed d/c instructions again, pt states that she is ready to go home, report to driver, confirmed destination, [place] dpt [place].

## 2021-02-25 NOTE — ED Notes (Signed)
Meal tray given, pt continues to await transport home

## 2021-02-25 NOTE — ED Notes (Signed)
Pt in bed with eyes closed, pt arouses easily to verbal stim, pt denies pain, EMS called to say they will not be able to come get pt.

## 2021-02-25 NOTE — ED Notes (Signed)
Report called to [facility], number [phone] talked with Laverle Hobby, pt awaits transport back to facility, per facility they are unable to come get pt.

## 2021-02-25 NOTE — Progress Notes (Signed)
CM spoke with [facility], no convalescent trucks available for transport, patient does not meet medical necessity for  ems transport.  CM spoke with [facility] transportation services, a third party vendor has been arranged to provide transportation for patient from [place] to [facility].  CM spoke with patient's sister regarding this plan and obtained verbal consent for transport and waiver form, second witness to consent is K. Vescio CSW.  CM spoke with bedside RN and notified of transportation plans, bedside RN to provide a facility wheelchair for patient to use during transport in wheelchair Zenaida Niece, transportation vendor to return wheelchair to emergency department once transport is complete.

## 2021-07-13 ENCOUNTER — Emergency Department: Payer: Medicare (Managed Care)

## 2021-07-13 ENCOUNTER — Other Ambulatory Visit: Payer: Self-pay

## 2021-07-13 ENCOUNTER — Emergency Department
Admission: EM | Admit: 2021-07-13 | Discharge: 2021-07-14 | Disposition: A | Discharge status: Home / Self Care | Payer: Medicare (Managed Care) | Attending: Emergency Medicine | Admitting: Emergency Medicine

## 2021-07-13 DIAGNOSIS — S0990XA Unspecified injury of head, initial encounter: Secondary | ICD-10-CM

## 2021-07-13 DIAGNOSIS — F17210 Nicotine dependence, cigarettes, uncomplicated: Secondary | ICD-10-CM | POA: Diagnosis not present

## 2021-07-13 DIAGNOSIS — S0083XA Contusion of other part of head, initial encounter: Secondary | ICD-10-CM | POA: Insufficient documentation

## 2021-07-13 DIAGNOSIS — Z79899 Other long term (current) drug therapy: Secondary | ICD-10-CM | POA: Insufficient documentation

## 2021-07-13 DIAGNOSIS — I10 Essential (primary) hypertension: Secondary | ICD-10-CM | POA: Diagnosis not present

## 2021-07-13 DIAGNOSIS — Z7982 Long term (current) use of aspirin: Secondary | ICD-10-CM | POA: Diagnosis not present

## 2021-07-13 DIAGNOSIS — W07XXXA Fall from chair, initial encounter: Secondary | ICD-10-CM | POA: Insufficient documentation

## 2021-07-13 DIAGNOSIS — S0081XA Abrasion of other part of head, initial encounter: Secondary | ICD-10-CM | POA: Diagnosis not present

## 2021-07-13 MED ORDER — ACETAMINOPHEN 500 MG PO TABS
1000.0000 mg | ORAL_TABLET | Freq: Once | ORAL | Status: AC
  Administered 2021-07-13: 1000 mg via ORAL
  Filled 2021-07-13: qty 2

## 2021-07-13 MED ORDER — BACITRACIN ZINC 500 UNIT/GM EX OINT
TOPICAL_OINTMENT | Freq: Once | CUTANEOUS | Status: AC
  Filled 2021-07-13: qty 0.9

## 2021-07-13 NOTE — ED Notes (Signed)
Pt resides in [facility].

## 2021-07-13 NOTE — Discharge Instructions (Addendum)
Please apply antibiotic ointment to abrasions daily.  Take Tylenol as needed for pain.  Return to the ER for any worsening headaches, nausea, vomiting, vision changes or any urgent changes in your health

## 2021-07-13 NOTE — ED Provider Notes (Signed)
[facility] EMERGENCY DEPARTMENT Provider Note   CSN: [id number] Arrival date & time: 07/13/21  1326     History Chief Complaint  Patient presents with   [name]    Jenna Riggs is a 60 y.o. female [facility] to the [facility] for evaluation of a fall.  Patient was at family care home just prior to arrival when she tripped getting out of the chair and fell face first onto the floor.  She suffered abrasion to the right side of her forehead and right side of the nose.  She is got some pain and swelling to the face.  She denies any loss of consciousness, nausea or vomiting.  She is complaining of some mild neck pain with no radicular symptoms.  She denies any vision changes, photophobia, nausea or vomiting.  Headache is mild mostly complaining of frontal soreness and swelling along the bridge of the nose.  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     Family History  Problem Relation Age of Onset   Diabetes Mother    Diabetes Father     Social History   Tobacco Use   Smoking status: Every Day    Packs/day: 1.00    Years: 35.00    Pack years: 35.00    Types:  Cigarettes   Smokeless tobacco: Never   Tobacco comments:    Patient refused  Vaping Use   Vaping Use: Never used  Substance Use Topics   Alcohol use: No   Drug use: No    Home Medications Prior to Admission medications   Medication Sig Start Date End Date Taking? Authorizing Provider  aspirin EC 81 MG tablet Take 1 tablet (81 mg total) by mouth daily. 03/11/19   Clapacs, Madie Reno, MD  atorvastatin (LIPITOR) 10 MG tablet Take 10 mg by mouth daily. 01/28/21   [provider]  divalproex (DEPAKOTE) 500 MG DR tablet TAKE 1 TABLET BY MOUTH TWICE DAILY Patient taking differently: Take 500 mg by mouth 2 (two) times daily. 04/16/19   Clapacs, Madie Reno, MD  ibuprofen (ADVIL) 800 MG tablet Take 800 mg by mouth 3 (three) times daily. 01/15/21   [provider]  lisinopril (ZESTRIL) 10 MG tablet TAKE 1 TABLET BY MOUTH DAILY Patient taking differently: Take 10 mg by mouth daily. 03/12/19   Clapacs, Madie Reno, MD  magnesium hydroxide (MILK OF MAGNESIA) 400 MG/5ML suspension Take 15 mLs by mouth 2 (two) times daily as needed for mild constipation.    [provider]  QUEtiapine (SEROQUEL) 400 MG tablet Take 1 tablet (400 mg total) by mouth at bedtime. 03/11/19   Clapacs, Jenny Reichmann  T, MD  traZODone (DESYREL) 100 MG tablet Take 3 tablets (300 mg total) by mouth at bedtime. 03/11/19   Clapacs, Madie Reno, MD    Allergies    Patient has no known allergies.  Review of Systems   Review of Systems  Constitutional:  Negative for activity change.  Eyes:  Negative for pain and visual disturbance.  Respiratory:  Negative for shortness of breath.   Cardiovascular:  Negative for chest pain and leg swelling.  Gastrointestinal:  Negative for abdominal pain, nausea and vomiting.  Genitourinary:  Negative for flank pain and pelvic pain.  Musculoskeletal:  Positive for neck pain. Negative for arthralgias, gait problem, joint swelling, myalgias and neck stiffness.  Skin:  Positive for wound.  Neurological:   Negative for dizziness, syncope, weakness, light-headedness, numbness and headaches.  Psychiatric/Behavioral:  Negative for confusion and decreased concentration.    Physical Exam Updated Vital Signs BP (!) 173/102 (BP Location: Left Arm)   Pulse 93   Temp 98.1 F (36.7 C) (Oral)   Resp 16   SpO2 98%   Physical Exam Constitutional:      Appearance: She is well-developed.  HENT:     Head: Normocephalic and atraumatic.     Right Ear: External ear normal.     Left Ear: External ear normal.     Nose: Nose normal.  Eyes:     Extraocular Movements: Extraocular movements intact.     Conjunctiva/sclera: Conjunctivae normal.     Pupils: Pupils are equal, round, and reactive to light.  Cardiovascular:     Rate and Rhythm: Normal rate.  Pulmonary:     Effort: Pulmonary effort is normal. No respiratory distress.     Breath sounds: Normal breath sounds.  Abdominal:     Palpations: Abdomen is soft.     Tenderness: There is no abdominal tenderness.  Musculoskeletal:        General: No deformity. Normal range of motion.     Cervical back: Normal range of motion.  Skin:    General: Skin is warm and dry.     Findings: Rash present.     Comments: Superficial abrasion to the right forehead as well as the right bridge of the nose.  Soft tissue swelling noted along the bridge of the nose with no deformity.  No signs of septal hematoma.  No orbital rim tenderness.  Normal EOM bilaterally.  Neurological:     General: No focal deficit present.     Mental Status: She is alert and oriented to person, place, and time. Mental status is at baseline.     Cranial Nerves: No cranial nerve deficit.     Coordination: Coordination normal.  Psychiatric:        Mood and Affect: Mood normal.        Behavior: Behavior normal.    ED Results / Procedures / Treatments   Labs (all labs ordered are listed, but only abnormal results are displayed) Labs Reviewed - No data to  display  EKG None  Radiology CT Head Wo Contrast  Result Date: 07/13/2021 CLINICAL DATA:  Facial injury after fall. EXAM: CT HEAD WITHOUT CONTRAST CT MAXILLOFACIAL WITHOUT CONTRAST CT CERVICAL SPINE WITHOUT CONTRAST TECHNIQUE: Multidetector CT imaging of the head, cervical spine, and maxillofacial structures were performed using the standard protocol without intravenous contrast. Multiplanar CT image reconstructions of the cervical spine and maxillofacial structures were also generated. COMPARISON:  September 25, 2017. 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 Stable dilatation of right lateral ventricle is noted most likely due to surrounding atrophy. No hemorrhage, acute infarction or mass lesion is noted. Vascular: No hyperdense vessel or unexpected calcification. Skull: Normal. Negative for fracture or focal lesion. Other: None. CT MAXILLOFACIAL FINDINGS Osseous: No fracture or mandibular dislocation. No destructive process. Orbits: Negative. No traumatic or inflammatory finding. Sinuses: Probable right maxillary mucous retention cyst is noted. Soft tissues: Negative. CT CERVICAL SPINE FINDINGS Alignment: Normal. Skull base and vertebrae: No acute fracture. No primary bone lesion or focal pathologic process. Soft tissues and spinal canal: No prevertebral fluid or swelling. No visible canal hematoma. Disc levels:  Mild degenerative disc disease is noted at C5-6. Upper chest: Negative. Other: None. IMPRESSION: No acute intracranial abnormality seen. No traumatic injury is seen in maxillofacial region. Mild degenerative disc disease is noted at C5-6. No acute abnormality seen in the cervical spine. Electronically Signed   By: Lupita Raider M.D.   On: 07/13/2021 14:54   CT Cervical Spine Wo Contrast  Result Date: 07/13/2021 CLINICAL DATA:  Facial injury after fall. EXAM: CT HEAD  WITHOUT CONTRAST CT MAXILLOFACIAL WITHOUT CONTRAST CT CERVICAL SPINE WITHOUT CONTRAST TECHNIQUE: Multidetector CT imaging of the head, cervical spine, and maxillofacial structures were performed using the standard protocol without intravenous contrast. Multiplanar CT image reconstructions of the cervical spine and maxillofacial structures were also generated. COMPARISON:  September 25, 2017. 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 Stable dilatation of right lateral ventricle is noted most likely due to surrounding atrophy. No hemorrhage, acute infarction or mass lesion is noted. Vascular: No hyperdense vessel or unexpected calcification. Skull: Normal. Negative for fracture or focal lesion. Other: None. CT MAXILLOFACIAL FINDINGS Osseous: No fracture or mandibular dislocation. No destructive process. Orbits: Negative. No traumatic or inflammatory finding. Sinuses: Probable right maxillary mucous retention cyst is noted. Soft tissues: Negative. CT CERVICAL SPINE FINDINGS Alignment: Normal. Skull base and vertebrae: No acute fracture. No primary bone lesion or focal pathologic process. Soft tissues and spinal canal: No prevertebral fluid or swelling. No visible canal hematoma. Disc levels:  Mild degenerative disc disease is noted at C5-6. Upper chest: Negative. Other: None. IMPRESSION: No acute intracranial abnormality seen. No traumatic injury is seen in maxillofacial region. Mild degenerative disc disease is noted at C5-6. No acute abnormality seen in the cervical spine. Electronically Signed   By: Lupita Raider M.D.   On: 07/13/2021 14:54   CT Maxillofacial Wo Contrast  Result Date: 07/13/2021 CLINICAL DATA:  Facial injury after fall. EXAM: CT HEAD WITHOUT CONTRAST CT MAXILLOFACIAL WITHOUT CONTRAST CT CERVICAL SPINE WITHOUT CONTRAST TECHNIQUE: Multidetector CT imaging of the head,  cervical spine, and maxillofacial structures were performed using the standard protocol without intravenous contrast. Multiplanar CT image reconstructions of the cervical spine and maxillofacial structures were also generated. COMPARISON:  September 25, 2017. 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 Stable dilatation of right lateral ventricle is noted most likely due to surrounding atrophy. No hemorrhage, acute infarction or mass lesion is noted. Vascular: No hyperdense vessel or unexpected calcification. Skull: Normal. Negative for fracture or focal lesion. Other: None. CT MAXILLOFACIAL FINDINGS Osseous: No fracture or mandibular dislocation. No destructive process. Orbits: Negative. No traumatic or inflammatory finding.  Sinuses: Probable right maxillary mucous retention cyst is noted. Soft tissues: Negative. CT CERVICAL SPINE FINDINGS Alignment: Normal. Skull base and vertebrae: No acute fracture. No primary bone lesion or focal pathologic process. Soft tissues and spinal canal: No prevertebral fluid or swelling. No visible canal hematoma. Disc levels:  Mild degenerative disc disease is noted at C5-6. Upper chest: Negative. Other: None. IMPRESSION: No acute intracranial abnormality seen. No traumatic injury is seen in maxillofacial region. Mild degenerative disc disease is noted at C5-6. No acute abnormality seen in the cervical spine. Electronically Signed   By: Marijo Conception M.D.   On: 07/13/2021 14:54    Procedures Procedures   Medications Ordered in ED Medications  bacitracin ointment (has no administration in time range)  acetaminophen (TYLENOL) tablet 1,000 mg (has no administration in time range)    ED Course  I have reviewed the triage vital signs and the nursing notes.  Pertinent labs & imaging results that were available during my care of the patient were reviewed by  me and considered in my medical decision making (see chart for details).    MDM Rules/Calculators/A&P                         60 year old female with a fall out of her chair earlier today after getting her foot stuck.  She fell forward hitting her face on the ground.  CT of the head, cervical spine, maxillofacial region negative for any acute bony abnormality.  No signs of concussion or neurological deficits.  She does have some mild soft tissue swelling with abrasions.  Patient started on topical antibiotic ointment and Tylenol.  She will continue with antibiotic ointment and Tylenol at home and she understands signs symptoms return to the ER for.  Final Clinical Impression(s) / ED Diagnoses Final diagnoses:  Injury of head, initial encounter  Abrasion of face, initial encounter  Facial hematoma, initial encounter    Rx / DC Orders ED Discharge Orders     None        Renata Caprice 07/13/21 2031    Duffy Bruce, MD 07/13/21 2123

## 2021-07-13 NOTE — ED Notes (Signed)
Will call safe transport in am for pt to return to facility.

## 2021-07-13 NOTE — ED Triage Notes (Signed)
Pt comes into the ED via EMS from [facility] home, fell today and landed face first, lac to the nose, hematoma to the forehead, right hip pain, denies LOC.   132/96 HR80 [id number] 98.7 temp

## 2021-07-14 NOTE — ED Notes (Signed)
Pt given meal tray.

## 2021-07-14 NOTE — ED Notes (Addendum)
Pt given paper scrubs to change from soiled clothes

## 2021-10-05 ENCOUNTER — Ambulatory Visit: Payer: Medicare (Managed Care) | Admitting: Podiatry

## 2022-01-26 ENCOUNTER — Ambulatory Visit (INDEPENDENT_AMBULATORY_CARE_PROVIDER_SITE_OTHER): Payer: Medicare (Managed Care)

## 2022-01-26 ENCOUNTER — Ambulatory Visit (INDEPENDENT_AMBULATORY_CARE_PROVIDER_SITE_OTHER): Payer: Medicare (Managed Care) | Admitting: Podiatry

## 2022-01-26 ENCOUNTER — Encounter: Payer: Self-pay | Admitting: Podiatry

## 2022-01-26 DIAGNOSIS — M79674 Pain in right toe(s): Secondary | ICD-10-CM | POA: Diagnosis not present

## 2022-01-26 DIAGNOSIS — R600 Localized edema: Secondary | ICD-10-CM | POA: Diagnosis not present

## 2022-01-26 DIAGNOSIS — B351 Tinea unguium: Secondary | ICD-10-CM

## 2022-01-26 DIAGNOSIS — S90112A Contusion of left great toe without damage to nail, initial encounter: Secondary | ICD-10-CM | POA: Diagnosis not present

## 2022-01-26 DIAGNOSIS — M79675 Pain in left toe(s): Secondary | ICD-10-CM

## 2022-01-26 NOTE — Progress Notes (Signed)
  Subjective:  Patient ID: Jenna Riggs, female    DOB: Jan 02, 1961,  MRN: [id number]  Chief Complaint  Patient presents with   Nail Problem    "Look at my nails.  My toe is swollen and it hurts."    61 y.o. female presents with the above complaint. History confirmed with patient. She thinks she bumped the great toe on the left, previously has had the nail removed before, the remaining nails are thickened, elongated and causing pain   Objective:  Physical Exam: warm, good capillary refill, no trophic changes or ulcerative lesions, normal DP and PT pulses, normal sensory exam, and previous nail removal L hallux with minor regrowth. Left Foot: dystrophic yellowed discolored nail plates with subungual debris Right Foot: dystrophic yellowed discolored nail plates with subungual debris   Radiographs: Multiple views x-ray of the left foot: no fracture, dislocation, there are mild degenerative changes and hallux limitus on left 1st MTPJ Assessment:   1. Contusion of left great toe without damage to nail, initial encounter   2. Pain due to onychomycosis of toenails of both feet      Plan:  Patient was evaluated and treated and all questions answered.  Discussed the etiology and treatment options for the condition in detail with the patient. Educated patient on the topical and oral treatment options for mycotic nails. Recommended debridement of the nails today. Sharp and mechanical debridement performed of all painful and mycotic nails today. Nails debrided in length and thickness using a nail nipper to level of comfort. Discussed treatment options including appropriate shoe gear. Follow up as needed for painful nails.    Xray reviewed with patient, no fracture distal phalanx. 1st MTPJ has degenerative changes on xray but is asymptomatic  Return if symptoms worsen or fail to improve.

## 2022-07-25 ENCOUNTER — Ambulatory Visit: Payer: Medicare (Managed Care) | Admitting: Podiatry

## 2022-08-24 ENCOUNTER — Ambulatory Visit (INDEPENDENT_AMBULATORY_CARE_PROVIDER_SITE_OTHER): Payer: Medicare (Managed Care) | Admitting: Podiatry

## 2022-08-24 DIAGNOSIS — Z91199 Patient's noncompliance with other medical treatment and regimen due to unspecified reason: Secondary | ICD-10-CM

## 2022-08-27 NOTE — Progress Notes (Signed)
Patient was no-show for appointment today 

## 2022-10-31 ENCOUNTER — Ambulatory Visit: Payer: Medicare (Managed Care) | Admitting: Podiatry

## 2022-11-14 ENCOUNTER — Ambulatory Visit (INDEPENDENT_AMBULATORY_CARE_PROVIDER_SITE_OTHER): Payer: Medicare Other | Admitting: Podiatry

## 2022-11-14 DIAGNOSIS — Z91199 Patient's noncompliance with other medical treatment and regimen due to unspecified reason: Secondary | ICD-10-CM

## 2022-11-14 NOTE — Progress Notes (Signed)
Patient was no-show for appointment today 

## 2023-03-05 IMAGING — CT CT MAXILLOFACIAL W/O CM
3 series · 16 of 47 positions shown, 19 images · non-contrast
Comparison: September 25, 2017.

CLINICAL DATA: Facial injury after fall.

EXAM:
CT HEAD WITHOUT CONTRAST
CT MAXILLOFACIAL WITHOUT CONTRAST
CT CERVICAL SPINE WITHOUT CONTRAST
TECHNIQUE: Multidetector CT imaging of the head, cervical spine, and
maxillofacial structures were performed using the standard protocol
without intravenous contrast. Multiplanar CT image reconstructions
of the cervical spine and maxillofacial structures were also
generated.

[Series 2: max soft · axial · 0.33mm/px · z∈[+98,+236]mm · 10 of 81 slices shown, 13 images]
[im 6/81  brain]
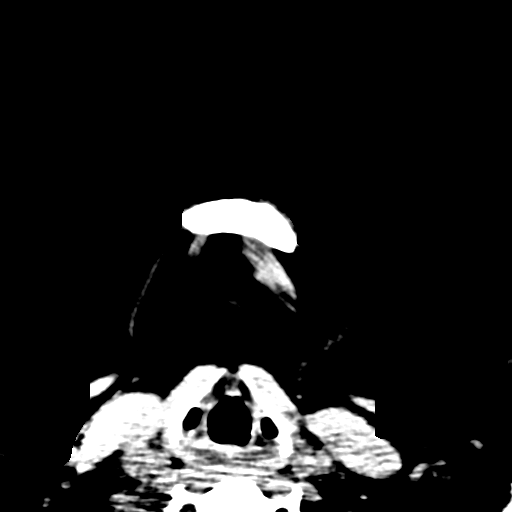
[im 6/81  bone]
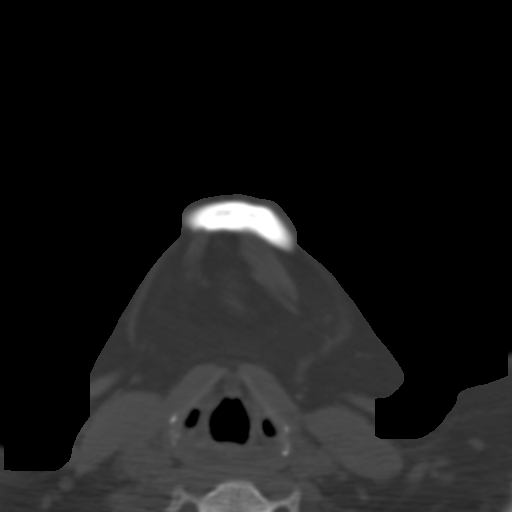
[im 14/81  bone]
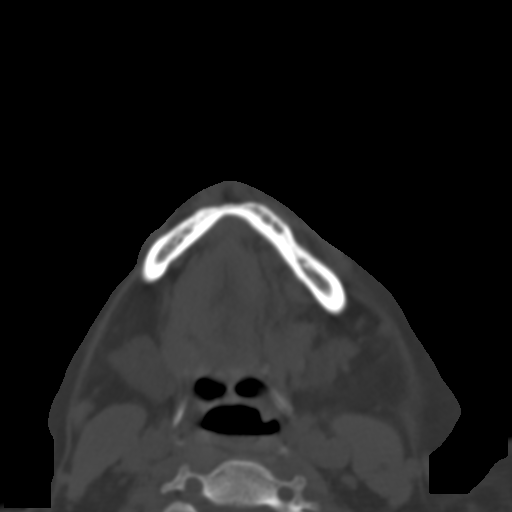
[im 23/81  bone]
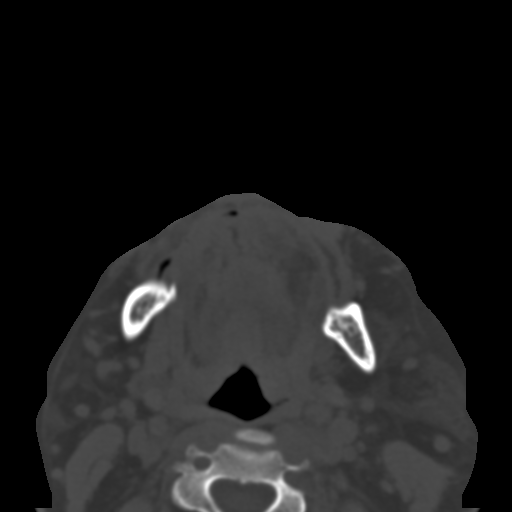
[im 28/81  bone]
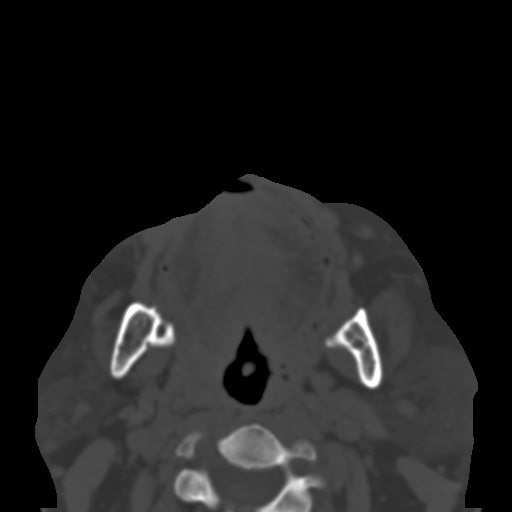
[im 36/81  brain]
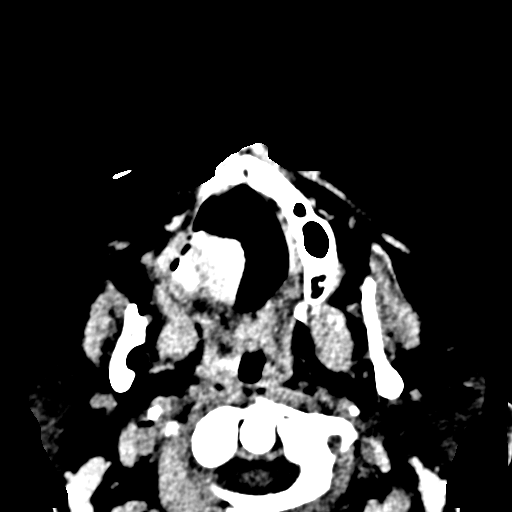
[im 36/81  bone]
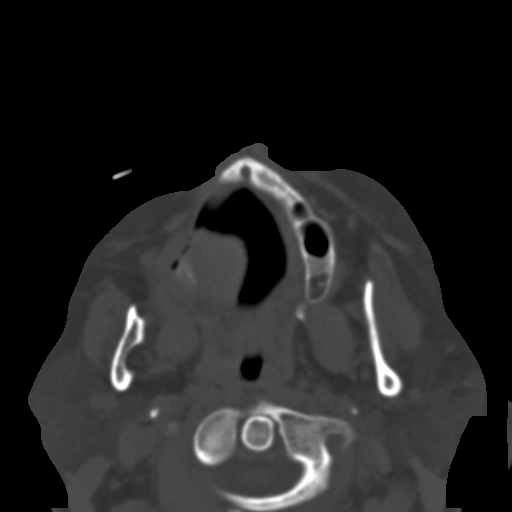
[im 45/81  bone]
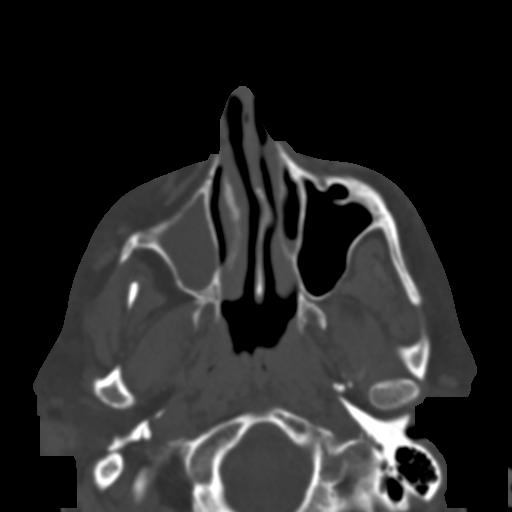
[im 53/81  bone]
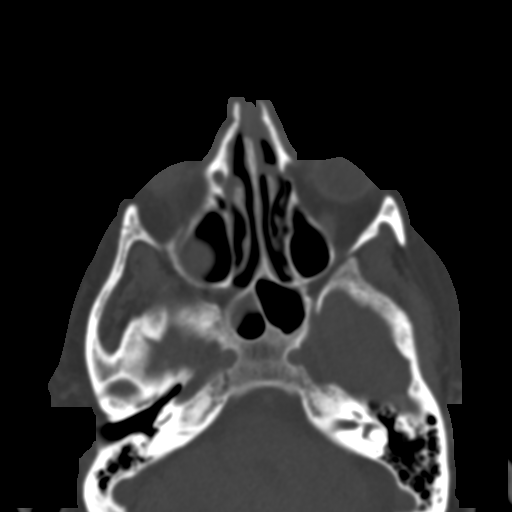
[im 61/81  bone]
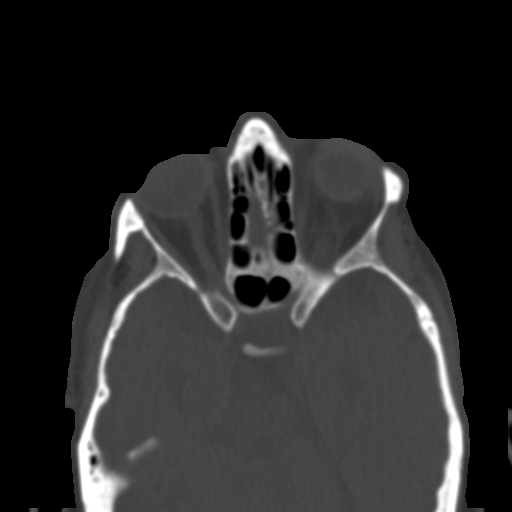
[im 67/81  brain]
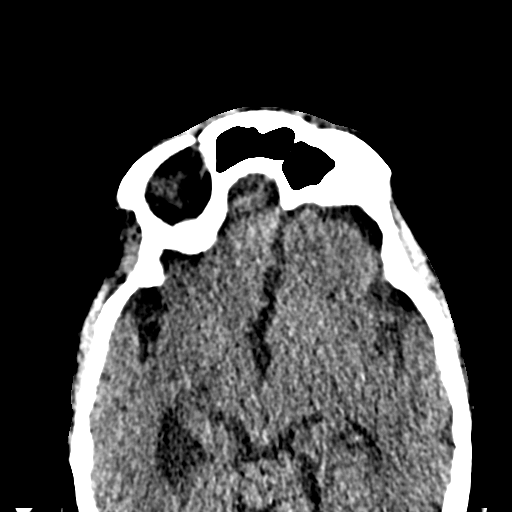
[im 67/81  bone]
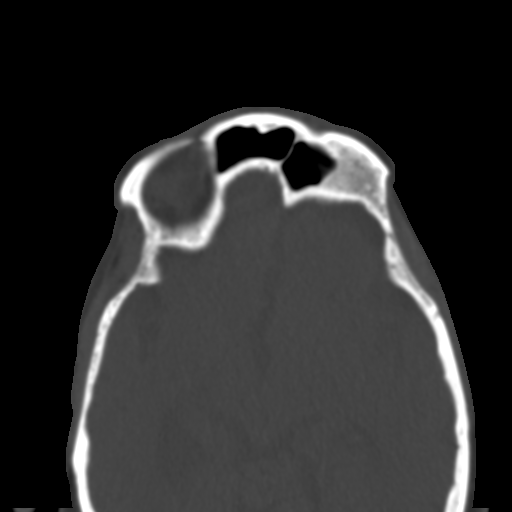
[im 75/81  bone]
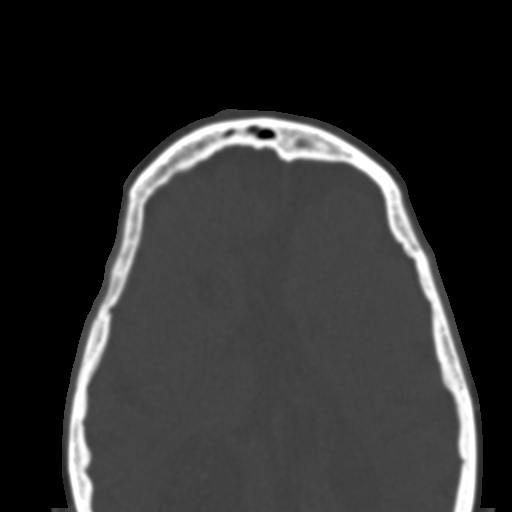

[Series 6: coronal soft · coronal · 0.36mm/px · 3 of 89 slices shown]
[im 30/89  bone]
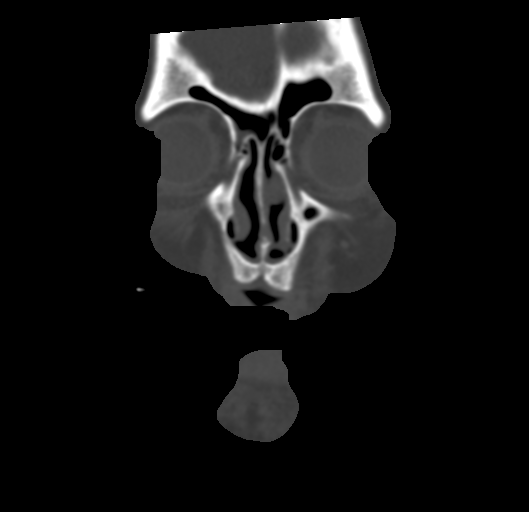
[im 40/89  bone]
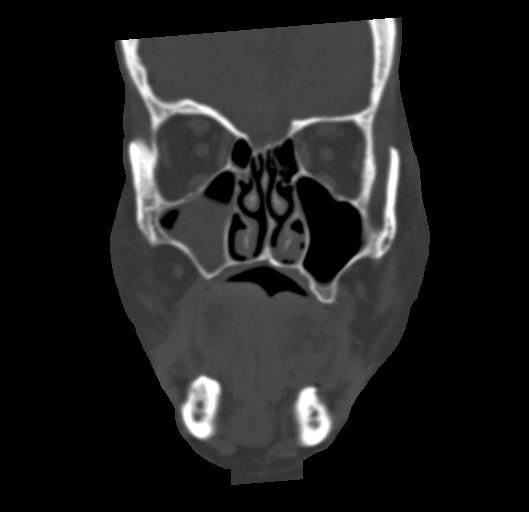
[im 49/89  bone]
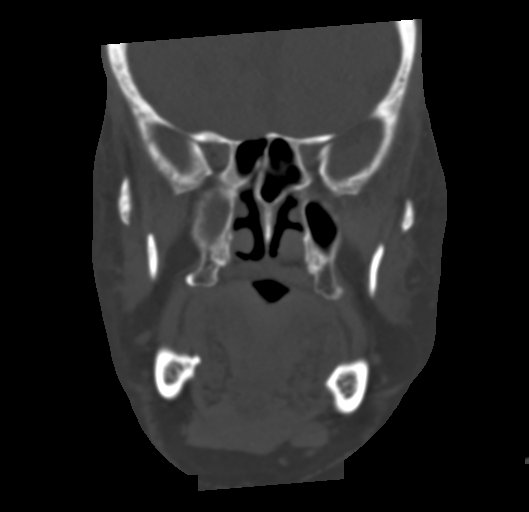

[Series 8: sagittal soft · sagittal · 0.35mm/px · 3 of 95 slices shown]
[im 32/95  bone]
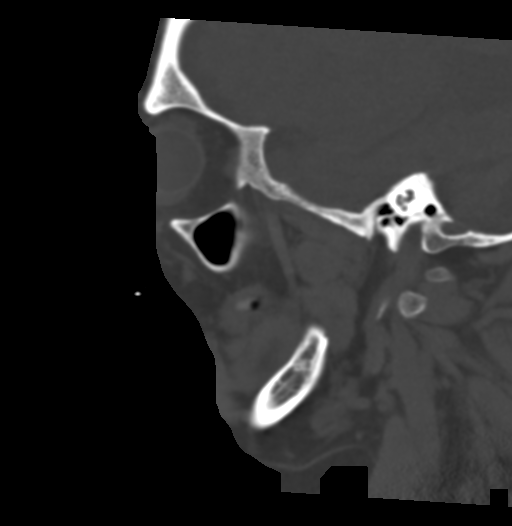
[im 48/95  bone]
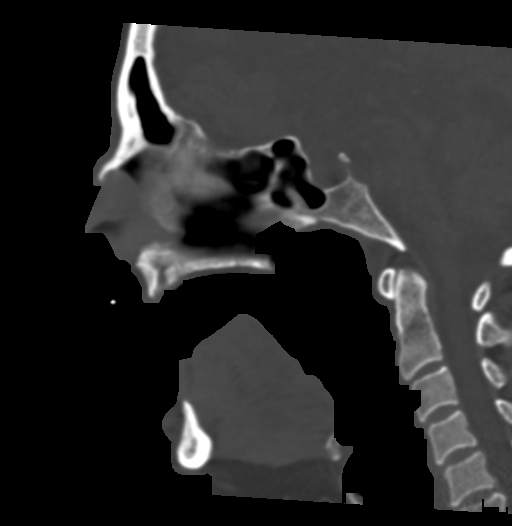
[im 63/95  bone]
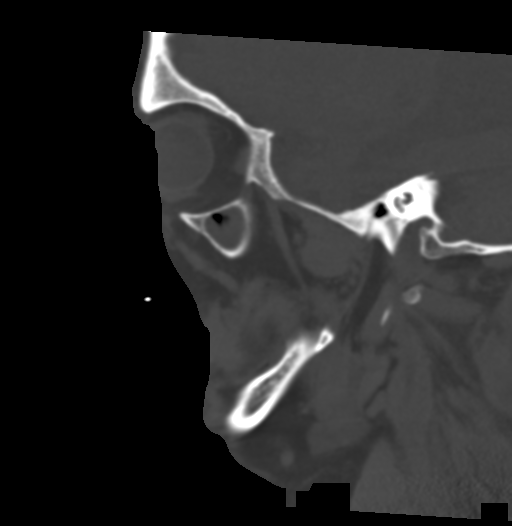

[16 of 47 positions shown; findings below may reference images not displayed]

FINDINGS: CT HEAD FINDINGS

Brain: Agenesis of the corpus callosum is again noted which is
congenital anomaly. Left cerebellar hypoplasia is also noted in
unchanged. Stable dilatation of right lateral ventricle is noted
most likely due to surrounding atrophy. No hemorrhage, acute
infarction or mass lesion is noted.

Vascular: No hyperdense vessel or unexpected calcification.

Skull: Normal. Negative for fracture or focal lesion.

Other: None.

CT MAXILLOFACIAL FINDINGS

Osseous: No fracture or mandibular dislocation. No destructive
process.

Orbits: Negative. No traumatic or inflammatory finding.

Sinuses: Probable right maxillary mucous retention cyst is noted.

Soft tissues: Negative.

CT CERVICAL SPINE FINDINGS

Alignment: Normal.

Skull base and vertebrae: No acute fracture. No primary bone lesion
or focal pathologic process.

Soft tissues and spinal canal: No prevertebral fluid or swelling. No
visible canal hematoma.

Disc levels:  Mild degenerative disc disease is noted at C5-6.

Upper chest: Negative.

Other: None.
IMPRESSION: No acute intracranial abnormality seen.

No traumatic injury is seen in maxillofacial region.

Mild degenerative disc disease is noted at C5-6. No acute
abnormality seen in the cervical spine.

## 2023-03-05 IMAGING — CT CT CERVICAL SPINE W/O CM
3 of 4 series · 9 of 33 positions shown, 11 images · non-contrast
Comparison: September 25, 2017.

CLINICAL DATA: Facial injury after fall.

EXAM:
CT HEAD WITHOUT CONTRAST
CT MAXILLOFACIAL WITHOUT CONTRAST
CT CERVICAL SPINE WITHOUT CONTRAST
TECHNIQUE: Multidetector CT imaging of the head, cervical spine, and
maxillofacial structures were performed using the standard protocol
without intravenous contrast. Multiplanar CT image reconstructions
of the cervical spine and maxillofacial structures were also
generated.

[Series 5: sagittal bone · sagittal · 0.22mm/px · 5 of 64 slices shown, 6 images]
[im 22/64  bone]
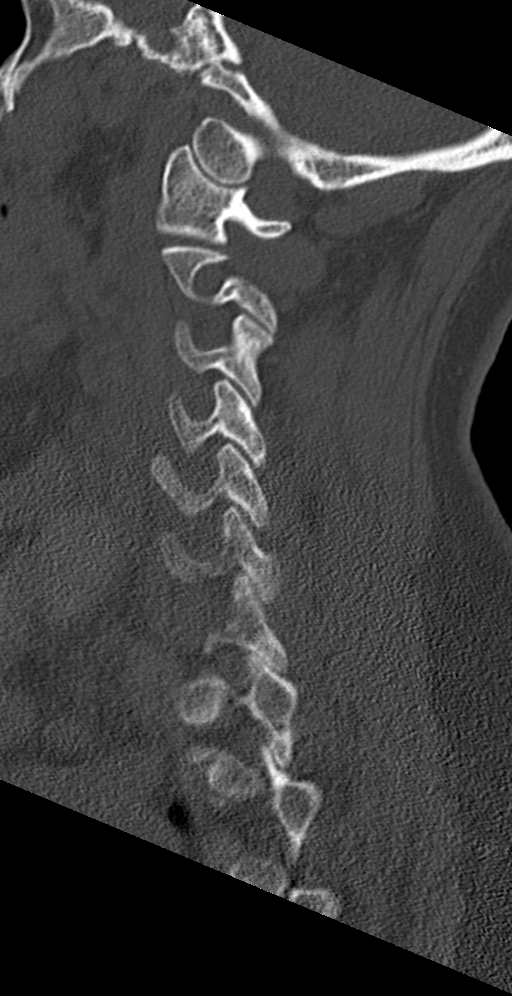
[im 27/64  bone]
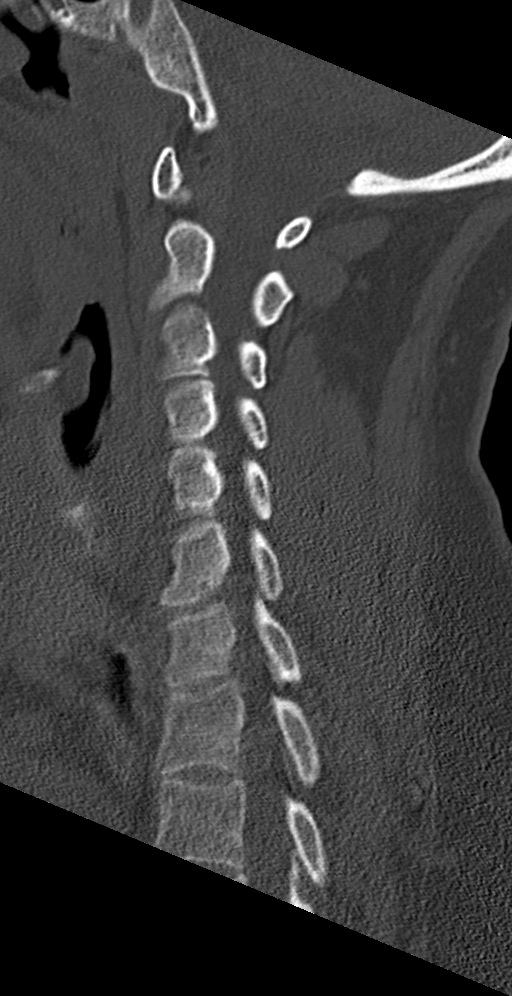
[im 32/64  soft-tissue]
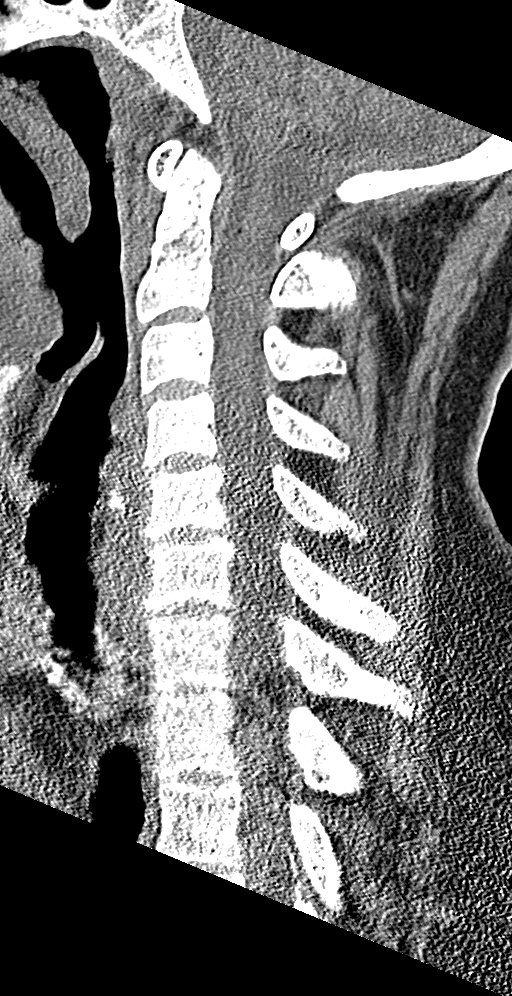
[im 32/64  bone]
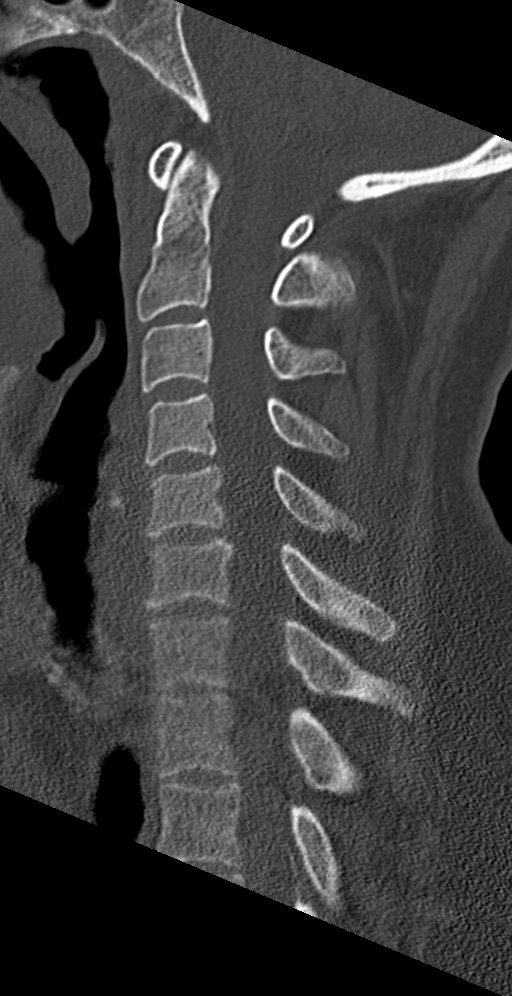
[im 37/64  bone]
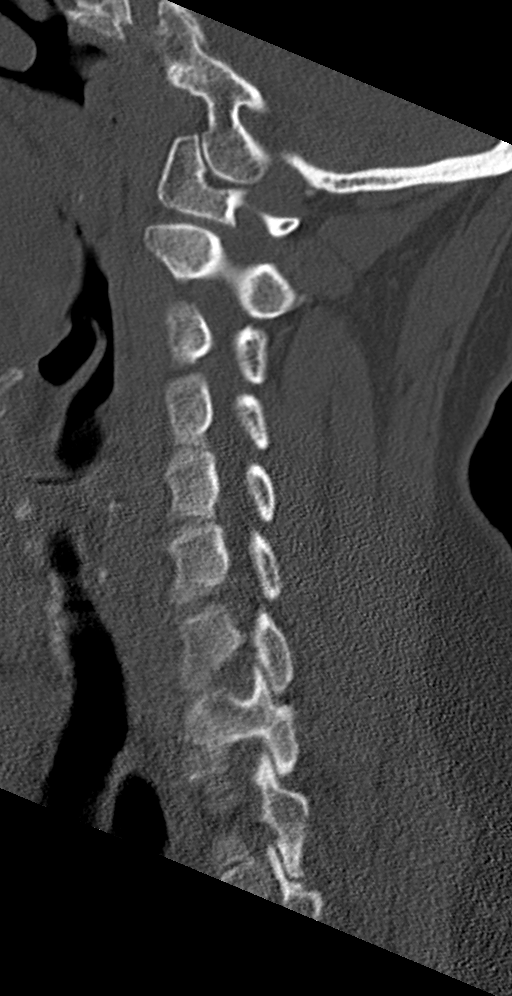
[im 43/64  bone]
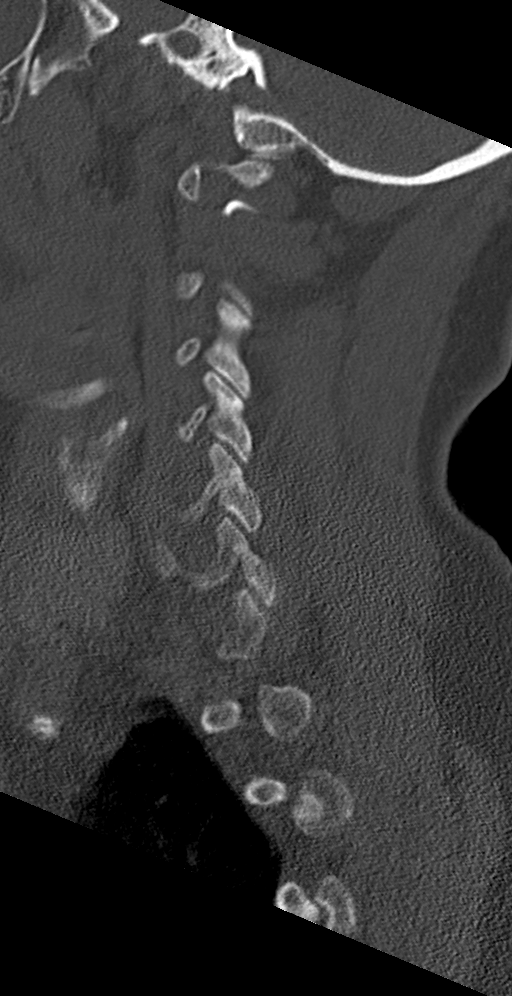

[Series 6: coronal bone · coronal · 0.25mm/px · 3 of 56 slices shown]
[im 12/56  bone]
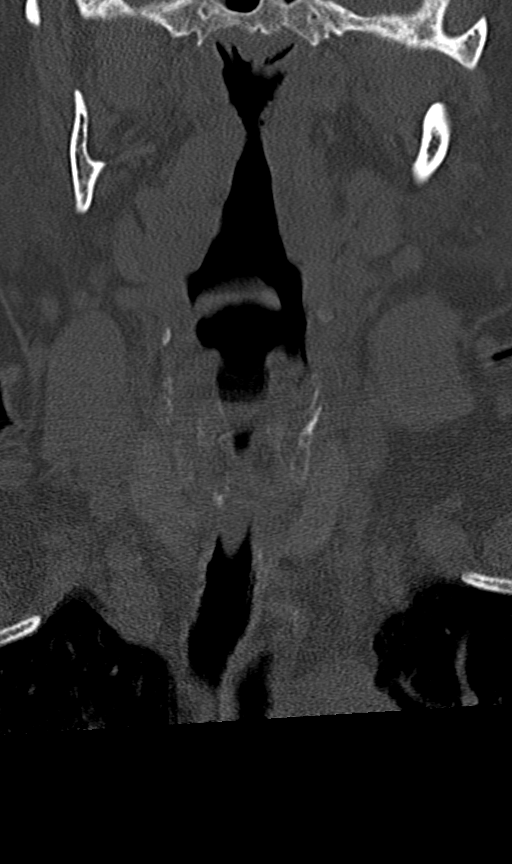
[im 23/56  bone]
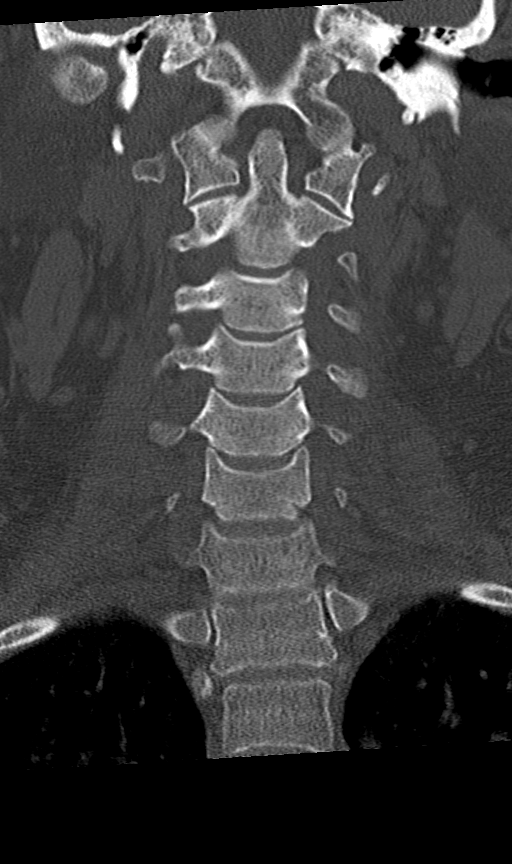
[im 34/56  bone]
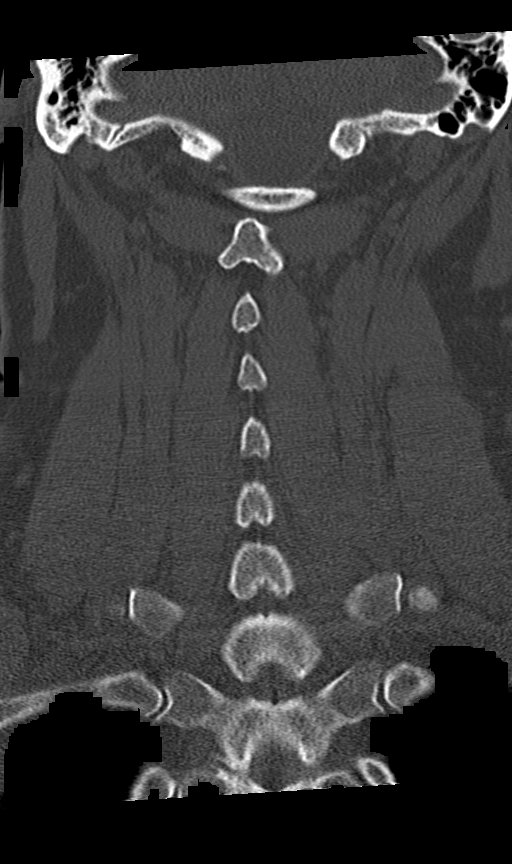

[Series 7: orthogonal axials · axial · 0.22mm/px · z∈[+100,+100]mm · 1 of 109 slices shown, 2 images]
[im 62/109  soft-tissue]
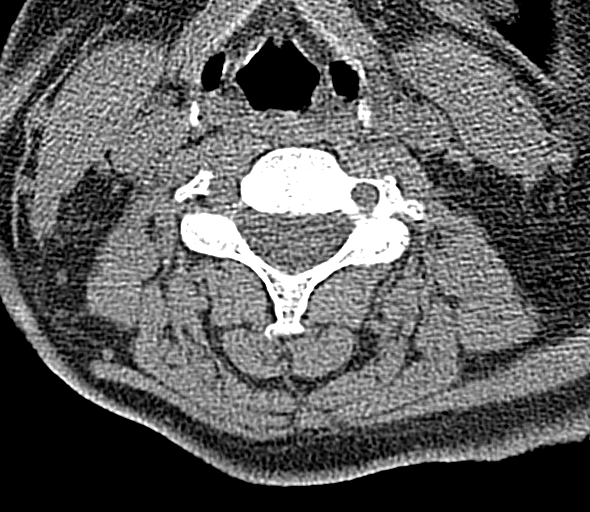
[im 62/109  bone]
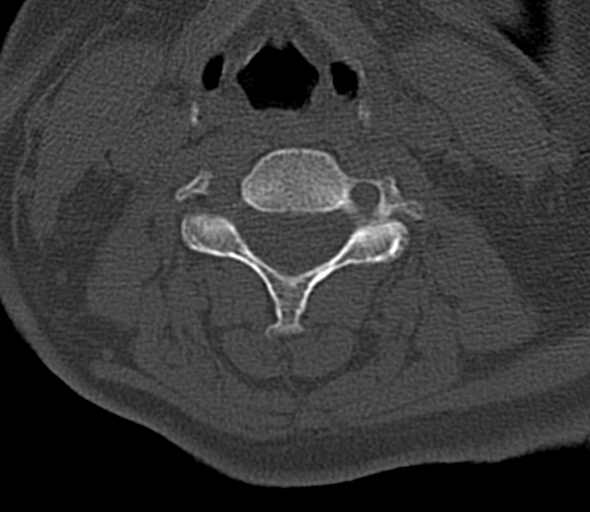

[9 of 33 positions shown; findings below may reference images not displayed]

FINDINGS: CT HEAD FINDINGS

Brain: Agenesis of the corpus callosum is again noted which is
congenital anomaly. Left cerebellar hypoplasia is also noted in
unchanged. Stable dilatation of right lateral ventricle is noted
most likely due to surrounding atrophy. No hemorrhage, acute
infarction or mass lesion is noted.

Vascular: No hyperdense vessel or unexpected calcification.

Skull: Normal. Negative for fracture or focal lesion.

Other: None.

CT MAXILLOFACIAL FINDINGS

Osseous: No fracture or mandibular dislocation. No destructive
process.

Orbits: Negative. No traumatic or inflammatory finding.

Sinuses: Probable right maxillary mucous retention cyst is noted.

Soft tissues: Negative.

CT CERVICAL SPINE FINDINGS

Alignment: Normal.

Skull base and vertebrae: No acute fracture. No primary bone lesion
or focal pathologic process.

Soft tissues and spinal canal: No prevertebral fluid or swelling. No
visible canal hematoma.

Disc levels:  Mild degenerative disc disease is noted at C5-6.

Upper chest: Negative.

Other: None.
IMPRESSION: No acute intracranial abnormality seen.

No traumatic injury is seen in maxillofacial region.

Mild degenerative disc disease is noted at C5-6. No acute
abnormality seen in the cervical spine.

## 2023-03-23 NOTE — Progress Notes (Signed)
Jenna Riggs, female    DOB: [birth year]    MRN: [id number]   Brief patient profile:  22  yowf  active smoker/ sz disorder living in group home with  p moved to [place] from [facility] room to room but falling a lot  at baseline   referred to pulmonary clinic in [facility]  03/24/2023 by Rush Farmer NP  for post op  resp failure  p L ankle fx.     History of Present Illness  03/24/2023  Pulmonary/ 1st office eval/ Christol Thetford / [facility] on ACEi /inderal  Chief Complaint  Patient presents with   Pulmonary Consult    Referred by Maudie Flakes. Recent admission to [facility]   Dyspnea:  new baseline = w/c bound  Cough: dry harsh with upper airway features  Sleep: 10-30 degrees hob with 2 pillows also  SABA use: once a day ? If helps  02: none   No obvious day to day or daytime pattern/variability or assoc excess/ purulent sputum or mucus plugs or hemoptysis or cp or chest tightness, subjective wheeze or overt sinus or hb symptoms.   Sleeping as above  without nocturnal  or early am exacerbation  of respiratory  c/o's or need for noct saba. Also denies any obvious fluctuation of symptoms with weather or environmental changes or other aggravating or alleviating factors except as outlined above   No unusual exposure hx or h/o childhood pna/ asthma or knowledge of premature birth.  Current Allergies, Complete Past Medical History, Past Surgical History, Family History, and Social History were reviewed in [employer] record.  ROS  The following are not active complaints unless bolded Hoarseness, sore throat, dysphagia, dental problems, itching, sneezing,  nasal congestion or discharge of excess mucus or purulent secretions, ear ache,   fever, chills, sweats, unintended wt loss or wt gain, classically pleuritic or exertional cp,  orthopnea pnd or arm/hand swelling  or leg swelling, presyncope, palpitations, abdominal pain, anorexia, nausea, vomiting, diarrhea  or change in  bowel habits or change in bladder habits, change in stools or change in urine, dysuria, hematuria,  rash, arthralgias, visual complaints, headache, numbness, weakness or ataxia or problems with walking or coordination,  change in mood or  memory.              Outpatient Medications Prior to Visit  Medication Sig Dispense Refill   albuterol (VENTOLIN HFA) 108 (90 Base) MCG/ACT inhaler Inhale 2 puffs into the lungs every 6 (six) hours as needed for wheezing or shortness of breath.     atorvastatin (LIPITOR) 10 MG tablet Take 10 mg by mouth daily.     benzonatate (TESSALON) 100 MG capsule Take 100 mg by mouth 3 (three) times daily.     Budeson-Glycopyrrol-Formoterol (BREZTRI AEROSPHERE) 160-9-4.8 MCG/ACT AERO Inhale 2 puffs into the lungs in the morning and at bedtime.     divalproex (DEPAKOTE) 500 MG DR tablet TAKE 1 TABLET BY MOUTH TWICE DAILY (Patient taking differently: Take 500 mg by mouth 2 (two) times daily.) 60 tablet 0   ibuprofen (ADVIL) 800 MG tablet Take 800 mg by mouth 3 (three) times daily.     lisinopril (ZESTRIL) 10 MG tablet TAKE 1 TABLET BY MOUTH DAILY (Patient taking differently: Take 10 mg by mouth daily.) 30 tablet 1   magnesium hydroxide (MILK OF MAGNESIA) 400 MG/5ML suspension Take 15 mLs by mouth 2 (two) times daily as needed for mild constipation.     propranolol (INDERAL) 40 MG tablet  Take 40 mg by mouth 2 (two) times daily.     QUEtiapine (SEROQUEL) 400 MG tablet Take 1 tablet (400 mg total) by mouth at bedtime. 30 tablet 1   traZODone (DESYREL) 100 MG tablet Take 3 tablets (300 mg total) by mouth at bedtime. 90 tablet 1   aspirin EC 81 MG tablet Take 1 tablet (81 mg total) by mouth daily. 30 tablet 1   No facility-administered medications prior to visit.    Past Medical History:  Diagnosis Date   Mental retardation    Renal disorder    Seizures (HCC)       Objective:     BP 128/72 (BP Location: Left Arm, Cuff Size: Normal)   Pulse 66   Ht 5\' 3"  (1.6 m)    Wt 265 lb (120.2 kg)   SpO2 96% Comment: on RA  BMI 46.94 kg/m   SpO2: 96 % (on RA) w/c bound obese wf slow responses but understandable speech   HEENT : Oropharynx  clear/ edentulous   Nasal turbinates nl   NECK :  without  apparent JVD/ palpable Nodes/TM   LUNGS: no acc muscle use,  Nl contour chest which is clear to A and P bilaterally without cough on insp or exp maneuvers   CV:  RRR  no s3 or murmur or increase in P2, and no edema   ABD:  massively obese soft and nontender   MS:   ext warm without deformities Or obvious joint restrictions  calf tenderness, cyanosis or clubbing    SKIN: warm and dry without lesions    NEURO:  alert, approp, nl sensorium with  no motor or cerebellar deficits apparent.    Cxr report 02/22/23 mild bibasilar atx        Assessment   Asthmatic bronchitis , chronic Active smoker  - 03/24/2023 rec d/c ace/propranolol   DDX of  difficult airways management almost all start with A and  include Adherence, Ace Inhibitors, Acid Reflux, Active Sinus Disease, Alpha 1 Antitripsin deficiency, Anxiety masquerading as Airways dz,  ABPA,  Allergy(esp in young), Aspiration (esp in elderly), Adverse effects of meds,  Active smoking or vaping, A bunch of PE's (a small clot burden can't cause this syndrome unless there is already severe underlying pulm or vascular dz with poor reserve) plus two Bs  = Bronchiectasis and Beta blocker use and one C= CHF  Active smoking top of the list (see separate a/p)   ACEi adverse effects also  at the  top of the usual list of suspects and the only way to rule it out is a trial off > see hbp a/p  ? Beta blocker use > In the setting of respiratory symptoms of unknown etiology,  It would be preferable to use bystolic, the most beta -1  selective Beta blocker available in sample form, with bisoprolol the most selective generic choice  on the market, at least on a trial basis, to make sure the spillover Beta 2 effects of the less  specific Beta blockers are not contributing to this patient's symptoms.  >>> see hbp    Continue albuterol up to 2 pffs every 4 hours as needed but should notice needs less once the above changes are made to BP meds especially if cuts the smoking down or out.    F/u 6 weeks p above changes are made       Essential hypertension Rec Stop lisinopril and replace with valsartan 160 mg daily   Stop Inderal  and replace with bisoprolol 5 mg daily  - can increase to twice daily if needed for bp goals      Cigarette smoker 4-5 min discussion re active cigarette smoking in addition to office E&M  Ask about tobacco use:   ongoing Advise quitting  I took an extended  opportunity with this patient to outline the consequences of continued cigarette use  in airway disorders based on all the data we have from the multiple national lung health studies (perfomed over decades at millions of dollars in cost)  indicating that smoking cessation, not choice of inhalers or pulmonary physicians, is the most important aspect of her  care.   Assess willingness:  Not committed at this point Assist in quit attempt:  Per PCP when ready Arrange follow up:   Follow up per Primary Care planned           Sandrea Hughs, MD 03/24/2023

## 2023-03-24 ENCOUNTER — Ambulatory Visit (INDEPENDENT_AMBULATORY_CARE_PROVIDER_SITE_OTHER): Payer: Medicare Other | Admitting: Internal Medicine

## 2023-03-24 ENCOUNTER — Encounter: Payer: Self-pay | Admitting: Internal Medicine

## 2023-03-24 VITALS — BP 128/72 | HR 66 | Ht 63.0 in | Wt 265.0 lb

## 2023-03-24 DIAGNOSIS — J4489 Other specified chronic obstructive pulmonary disease: Secondary | ICD-10-CM | POA: Diagnosis not present

## 2023-03-24 DIAGNOSIS — I10 Essential (primary) hypertension: Secondary | ICD-10-CM

## 2023-03-24 DIAGNOSIS — F17210 Nicotine dependence, cigarettes, uncomplicated: Secondary | ICD-10-CM

## 2023-03-24 NOTE — Assessment & Plan Note (Addendum)
Active smoker  - 03/24/2023 rec d/c ace/propranolol   DDX of  difficult airways management almost all start with A and  include Adherence, Ace Inhibitors, Acid Reflux, Active Sinus Disease, Alpha 1 Antitripsin deficiency, Anxiety masquerading as Airways dz,  ABPA,  Allergy(esp in young), Aspiration (esp in elderly), Adverse effects of meds,  Active smoking or vaping, A bunch of PE's (a small clot burden can't cause this syndrome unless there is already severe underlying pulm or vascular dz with poor reserve) plus two Bs  = Bronchiectasis and Beta blocker use and one C= CHF  Active smoking top of the list (see separate a/p)   ACEi adverse effects also  at the  top of the usual list of suspects and the only way to rule it out is a trial off > see hbp a/p  ? Beta blocker use > In the setting of respiratory symptoms of unknown etiology,  It would be preferable to use bystolic, the most beta -1  selective Beta blocker available in sample form, with bisoprolol the most selective generic choice  on the market, at least on a trial basis, to make sure the spillover Beta 2 effects of the less specific Beta blockers are not contributing to this patient's symptoms.  >>> see hbp    Continue albuterol up to 2 pffs every 4 hours as needed but should notice needs less once the above changes are made to BP meds especially if cuts the smoking down or out.    F/u 6 weeks p above changes are made

## 2023-03-24 NOTE — Patient Instructions (Signed)
Stop lisinopril and replace with valsartan 160 mg daily   Stop Inderal and replace with bisoprolol 5 mg daily  - can increase to twice daily if needed for bp goals   Discourage smoking as much as possible   Continue albuterol up to 2 pffs every 4 hours as needed but should notice needs less once the above changes are made to BP meds especially if cuts the smoking down or out.  Please schedule a follow up office visit in 6 weeks, call sooner if needed

## 2023-03-24 NOTE — Assessment & Plan Note (Signed)
Rec Stop lisinopril and replace with valsartan 160 mg daily   Stop Inderal and replace with bisoprolol 5 mg daily  - can increase to twice daily if needed for bp goals

## 2023-03-24 NOTE — Assessment & Plan Note (Signed)

## 2023-05-08 NOTE — Progress Notes (Unsigned)
Jenna Riggs, female    DOB: November 17, 1960    MRN: [id number]   Brief patient profile:  46  yowf  active smoker/ sz disorder living in group home with  p moved to [place] from [facility] room to room but falling a lot  at baseline   referred to pulmonary clinic in [facility]  03/24/2023 by Rush Farmer NP  for post op  resp failure  p L ankle fx.    History of Present Illness  03/24/2023  Pulmonary/ 1st office eval/ Eulice Rutledge / [facility] ACEi /inderal  Chief Complaint  Patient presents with   Pulmonary Consult    Referred by Maudie Flakes. Recent admission to [facility]   Dyspnea:  new baseline = w/c bound  Cough: dry harsh with upper airway features  Sleep: 10-30 degrees hob with 2 pillows also  SABA use: once a day ? If helps  02: none  Rec Stop lisinopril and replace with valsartan 160 mg daily  Stop Inderal and replace with bisoprolol 5 mg daily  - can increase to twice daily if needed for bp goals  Discourage smoking as much as possible  Continue albuterol up to 2 pffs every 4 hours as needed but should notice needs less once the above changes are made to BP meds especially if cuts the smoking down or out.   05/09/2023  f/u ov/[place] office/Bridget Westbrooks re: ? Acei case/ ? Inderal induced bronchospasm?  maint on  just albuterol prn   Chief Complaint  Patient presents with   Follow-up    6 week follow up   Dyspnea: using  w/c due to leg but able to do walker out in hall in  SNF much easier in terms of doe Cough: resolved  Sleeping: head is not elevated s    resp cc  SABA use: rarely  02:   obese    No obvious day to day or daytime variability or assoc excess/ purulent sputum or mucus plugs or hemoptysis or cp or chest tightness, subjective wheeze or overt sinus or hb symptoms.    Also denies any obvious fluctuation of symptoms with weather or environmental changes or other aggravating or alleviating factors except as outlined above   No unusual exposure hx or h/o childhood pna/  asthma or knowledge of premature birth.  Current Allergies, Complete Past Medical History, Past Surgical History, Family History, and Social History were reviewed in [employer] record.  ROS  The following are not active complaints unless bolded Hoarseness, sore throat, dysphagia, dental problems, itching, sneezing,  nasal congestion or discharge of excess mucus or purulent secretions, ear ache,   fever, chills, sweats, unintended wt loss or wt gain, classically pleuritic or exertional cp,  orthopnea pnd or arm/hand swelling  or leg swelling, presyncope, palpitations, abdominal pain, anorexia, nausea, vomiting, diarrhea  or change in bowel habits or change in bladder habits, change in stools or change in urine, dysuria, hematuria,  rash, arthralgias, visual complaints, headache, numbness, weakness or ataxia or problems with walking or coordination,  change in mood or  memory.        Current Meds  Medication Sig   albuterol (VENTOLIN HFA) 108 (90 Base) MCG/ACT inhaler Inhale 2 puffs into the lungs every 6 (six) hours as needed for wheezing or shortness of breath.   atorvastatin (LIPITOR) 10 MG tablet Take 10 mg by mouth daily.   benzonatate (TESSALON) 100 MG capsule Take 100 mg by mouth 3 (three) times daily.  Budeson-Glycopyrrol-Formoterol (BREZTRI AEROSPHERE) 160-9-4.8 MCG/ACT AERO Apparently not using as of ov 05/09/2023    divalproex (DEPAKOTE) 500 MG DR tablet TAKE 1 TABLET BY MOUTH TWICE DAILY (Patient taking differently: Take 500 mg by mouth 2 (two) times daily.)   QUEtiapine (SEROQUEL) 400 MG tablet Take 1 tablet (400 mg total) by mouth at bedtime.   traZODone (DESYREL) 100 MG tablet Take 3 tablets (300 mg total) by mouth at bedtime.          Past Medical History:  Diagnosis Date   Mental retardation    Renal disorder    Seizures (HCC)       Objective:     Wt Readings from Last 3 Encounters:  05/09/23 264 lb 4.8 oz (119.9 kg)  03/24/23 265 lb (120.2 kg)   02/24/21 220 lb (99.8 kg)      Vital signs reviewed  05/09/2023  - Note at rest 02 sats  92% % on RA   General appearance:    w/ bound MO by BMI)  wf nad    HEENT : Oropharynx  clear/ edentulous         NECK :  without  apparent JVD/ palpable Nodes/TM    LUNGS: no acc muscle use,  Nl contour chest which is clear to A and P bilaterally without cough on insp or exp maneuvers   CV:  RRR  no s3 or murmur or increase in P2, and no edema   ABD:  obese soft and nontender   MS:  Nl gait/ ext warm without deformities Or obvious joint restrictions  calf tenderness, cyanosis or clubbing    SKIN: warm and dry without lesions    NEURO:  alert, somewhat child like affect, nl sensorium with  no motor or cerebellar deficits apparent.          Assessment

## 2023-05-09 ENCOUNTER — Encounter: Payer: Self-pay | Admitting: Internal Medicine

## 2023-05-09 ENCOUNTER — Ambulatory Visit (INDEPENDENT_AMBULATORY_CARE_PROVIDER_SITE_OTHER): Payer: Medicare Other | Admitting: Internal Medicine

## 2023-05-09 VITALS — BP 139/77 | Ht 63.0 in | Wt 264.3 lb

## 2023-05-09 DIAGNOSIS — F17210 Nicotine dependence, cigarettes, uncomplicated: Secondary | ICD-10-CM | POA: Diagnosis not present

## 2023-05-09 DIAGNOSIS — I10 Essential (primary) hypertension: Secondary | ICD-10-CM

## 2023-05-09 DIAGNOSIS — J4489 Other specified chronic obstructive pulmonary disease: Secondary | ICD-10-CM

## 2023-05-09 NOTE — Patient Instructions (Addendum)
No change in medications   The key is to stop smoking completely before smoking completely stops you!      Pulmonary follow up is as needed

## 2023-05-10 NOTE — Assessment & Plan Note (Addendum)
Although even in retrospect it may not be clear the ACEi contributed to the pt's symptoms,  Pt improved off them and adding them back at this point or in the future would risk confusion in interpretation of non-specific respiratory symptoms to which this patient is prone  ie  Better not to muddy the waters here.   >>> avoid acei indefinitely, same for non-specific betablockers/ specific choices per PCP but I prefer valsartan for ARB and bisoprolol for Beta blocker as neither has much for pulmonary side effects   >>>>pulmonary f/u is prn

## 2023-05-10 NOTE — Assessment & Plan Note (Addendum)
Counseled re importance of smoking cessation but did not meet time criteria for separate billing            Each maintenance medication was reviewed in detail including emphasizing most importantly the difference between maintenance and prns and under what circumstances the prns are to be triggered using an action plan format where appropriate.  Total time for H and P, chart review, counseling, reviewing hfa  device(s) and generating customized AVS unique to this office visit / same day charting = 20 min

## 2023-05-10 NOTE — Assessment & Plan Note (Signed)
Active smoker  - 03/24/2023 rec d/c ace/propranolol  > resolved 05/09/2023 > ok to use prn albuterol   Not clear how much AB really present but advise: if needing saba more than twice daily then ok to resume breztri 2 every 12 hours

## 2023-11-20 ENCOUNTER — Other Ambulatory Visit: Payer: Self-pay | Admitting: Nephrology

## 2023-11-20 DIAGNOSIS — Q612 Polycystic kidney, adult type: Secondary | ICD-10-CM

## 2023-11-20 DIAGNOSIS — N184 Chronic kidney disease, stage 4 (severe): Secondary | ICD-10-CM

## 2023-12-11 ENCOUNTER — Ambulatory Visit
Admission: RE | Admit: 2023-12-11 | Discharge: 2023-12-11 | Disposition: A | Discharge status: Home / Self Care | Source: Ambulatory Visit | Attending: Nephrology | Admitting: Nephrology

## 2023-12-11 DIAGNOSIS — N184 Chronic kidney disease, stage 4 (severe): Secondary | ICD-10-CM | POA: Diagnosis present

## 2023-12-11 DIAGNOSIS — Q612 Polycystic kidney, adult type: Secondary | ICD-10-CM | POA: Diagnosis present
